# Patient Record
Sex: Female | Born: 1945 | Race: Black or African American | Hispanic: No | Marital: Single | Smoking: Former smoker
Health system: Southern US, Community
[De-identification: ages and names within clinical notes are randomized; demographics above are authoritative.]

## PROBLEM LIST (undated history)

## (undated) DIAGNOSIS — Z9889 Other specified postprocedural states: Secondary | ICD-10-CM

## (undated) DIAGNOSIS — R112 Nausea with vomiting, unspecified: Secondary | ICD-10-CM

## (undated) DIAGNOSIS — F329 Major depressive disorder, single episode, unspecified: Secondary | ICD-10-CM

## (undated) DIAGNOSIS — T4145XA Adverse effect of unspecified anesthetic, initial encounter: Secondary | ICD-10-CM

## (undated) DIAGNOSIS — R519 Headache, unspecified: Secondary | ICD-10-CM

## (undated) DIAGNOSIS — T8859XA Other complications of anesthesia, initial encounter: Secondary | ICD-10-CM

## (undated) DIAGNOSIS — J302 Other seasonal allergic rhinitis: Secondary | ICD-10-CM

## (undated) DIAGNOSIS — F32A Depression, unspecified: Secondary | ICD-10-CM

## (undated) DIAGNOSIS — I10 Essential (primary) hypertension: Secondary | ICD-10-CM

## (undated) DIAGNOSIS — R51 Headache: Secondary | ICD-10-CM

## (undated) DIAGNOSIS — S82891A Other fracture of right lower leg, initial encounter for closed fracture: Secondary | ICD-10-CM

## (undated) DIAGNOSIS — K589 Irritable bowel syndrome without diarrhea: Secondary | ICD-10-CM

## (undated) DIAGNOSIS — F419 Anxiety disorder, unspecified: Secondary | ICD-10-CM

## (undated) DIAGNOSIS — E785 Hyperlipidemia, unspecified: Secondary | ICD-10-CM

## (undated) HISTORY — PX: JOINT REPLACEMENT: SHX530

## (undated) HISTORY — PX: APPENDECTOMY: SHX54

---

## 1998-03-22 ENCOUNTER — Emergency Department (HOSPITAL_COMMUNITY): Admission: EM | Admit: 1998-03-22 | Discharge: 1998-03-22 | Payer: Self-pay | Admitting: Emergency Medicine

## 1998-03-22 ENCOUNTER — Encounter: Payer: Self-pay | Admitting: Emergency Medicine

## 2015-11-14 ENCOUNTER — Encounter (HOSPITAL_COMMUNITY): Payer: Self-pay | Admitting: Emergency Medicine

## 2015-11-14 ENCOUNTER — Emergency Department (HOSPITAL_COMMUNITY)
Admission: EM | Admit: 2015-11-14 | Discharge: 2015-11-14 | Disposition: A | Discharge status: Home / Self Care | Payer: Medicare HMO | Attending: Dermatology | Admitting: Dermatology

## 2015-11-14 DIAGNOSIS — R51 Headache: Secondary | ICD-10-CM | POA: Diagnosis not present

## 2015-11-14 DIAGNOSIS — I10 Essential (primary) hypertension: Secondary | ICD-10-CM | POA: Diagnosis not present

## 2015-11-14 DIAGNOSIS — Z5321 Procedure and treatment not carried out due to patient leaving prior to being seen by health care provider: Secondary | ICD-10-CM | POA: Diagnosis not present

## 2015-11-14 HISTORY — DX: Essential (primary) hypertension: I10

## 2015-11-14 NOTE — ED Notes (Signed)
Called for reassessment, no answer.

## 2015-11-14 NOTE — ED Notes (Signed)
Called to Reassess vitals. No answer. Times 3.

## 2015-11-14 NOTE — ED Triage Notes (Signed)
Pt sts sharp pain on right side of head starting this am

## 2015-11-14 NOTE — ED Notes (Signed)
Called to reassess, no answer.

## 2018-06-01 ENCOUNTER — Other Ambulatory Visit: Payer: Self-pay

## 2018-06-01 ENCOUNTER — Emergency Department (HOSPITAL_COMMUNITY)
Admission: EM | Admit: 2018-06-01 | Discharge: 2018-06-01 | Disposition: A | Discharge status: Home / Self Care | Payer: Medicare HMO | Attending: Emergency Medicine | Admitting: Emergency Medicine

## 2018-06-01 ENCOUNTER — Encounter (HOSPITAL_COMMUNITY): Payer: Self-pay | Admitting: Emergency Medicine

## 2018-06-01 ENCOUNTER — Emergency Department (HOSPITAL_COMMUNITY): Payer: Medicare HMO

## 2018-06-01 DIAGNOSIS — I10 Essential (primary) hypertension: Secondary | ICD-10-CM | POA: Insufficient documentation

## 2018-06-01 DIAGNOSIS — S99911A Unspecified injury of right ankle, initial encounter: Secondary | ICD-10-CM | POA: Diagnosis present

## 2018-06-01 DIAGNOSIS — Y998 Other external cause status: Secondary | ICD-10-CM | POA: Insufficient documentation

## 2018-06-01 DIAGNOSIS — S82841A Displaced bimalleolar fracture of right lower leg, initial encounter for closed fracture: Secondary | ICD-10-CM | POA: Insufficient documentation

## 2018-06-01 DIAGNOSIS — W1789XA Other fall from one level to another, initial encounter: Secondary | ICD-10-CM | POA: Insufficient documentation

## 2018-06-01 DIAGNOSIS — Y9301 Activity, walking, marching and hiking: Secondary | ICD-10-CM | POA: Insufficient documentation

## 2018-06-01 DIAGNOSIS — Z7982 Long term (current) use of aspirin: Secondary | ICD-10-CM | POA: Insufficient documentation

## 2018-06-01 DIAGNOSIS — Y92008 Other place in unspecified non-institutional (private) residence as the place of occurrence of the external cause: Secondary | ICD-10-CM | POA: Insufficient documentation

## 2018-06-01 MED ORDER — OXYCODONE HCL 5 MG PO TABS: 5.0000 mg | ORAL_TABLET | ORAL | 0 refills | Status: DC | PRN

## 2018-06-01 MED ORDER — ACETAMINOPHEN 500 MG PO TABS
1000.0000 mg | ORAL_TABLET | Freq: Once | ORAL | Status: AC
  Administered 2018-06-01: 1000 mg via ORAL
  Filled 2018-06-01: qty 2

## 2018-06-01 MED ORDER — OXYCODONE HCL 5 MG PO TABS
5.0000 mg | ORAL_TABLET | Freq: Once | ORAL | Status: AC
  Administered 2018-06-01: 5 mg via ORAL
  Filled 2018-06-01: qty 1

## 2018-06-01 NOTE — ED Provider Notes (Signed)
[facility]   CSN: [id number] Arrival date & time: 06/01/18  1410    History   Chief Complaint No chief complaint on file.   HPI Heather [name] is a 73 y.o. female.     HPI   73yo female presents with concern for mechanical fall with right ankle pain.  Reports both she and her daughter fell as they were walking out of her daughter's she-shed.  Reports she fell with her foot and ankle underneath her and she believes she broke it. Pain is severe. No numbness. Takes ASA no other anticoagulation.  No head trauma, headache, neck pain, chest pain, dyspnea or abdominal pain.    Past Medical History:  Diagnosis Date  . Hypertension     There are no active problems to display for this patient.   History reviewed. No pertinent surgical history.   OB History   No obstetric history on file.      Home Medications    Prior to Admission medications   Medication Sig Start Date End Date Taking? Authorizing Provider  oxyCODONE (ROXICODONE) 5 MG immediate release tablet Take 1 tablet (5 mg total) by mouth every 4 (four) hours as needed for severe pain. 06/01/18   Alvira MondaySchlossman, Kearsten Ginther, MD    Family History No family history on file.  Social History Social History   Tobacco Use  . Smoking status: Never Smoker  Substance Use Topics  . Alcohol use: Never    Frequency: Never  . Drug use: Never     Allergies   Augmentin [amoxicillin-pot clavulanate]   Review of Systems Review of Systems  Constitutional: Negative for fever.  HENT: Negative for sore throat.   Eyes: Negative for visual disturbance.  Respiratory: Negative for cough and shortness of breath.   Cardiovascular: Negative for chest pain.  Gastrointestinal: Negative for abdominal pain, nausea and vomiting.  Genitourinary: Negative for difficulty urinating.  Musculoskeletal: Positive for arthralgias and gait problem. Negative for back pain and neck pain.   Skin: Negative for rash.  Neurological: Negative for syncope and headaches.     Physical Exam Updated Vital Signs BP (!) 149/104   Pulse (!) 58   Temp 98.3 F (36.8 C) (Oral)   Resp 19   Ht 5' 3.5" (1.613 m)   Wt 74.8 kg   SpO2 96%   BMI 28.77 kg/m   Physical Exam Vitals signs and nursing note reviewed.  Constitutional:      General: She is not in acute distress.    Appearance: She is well-developed. She is not diaphoretic.  HENT:     Head: Normocephalic and atraumatic.  Eyes:     Conjunctiva/sclera: Conjunctivae normal.  Neck:     Musculoskeletal: Normal range of motion.  Cardiovascular:     Rate and Rhythm: Normal rate and regular rhythm.  Pulmonary:     Effort: Pulmonary effort is normal. No respiratory distress.  Musculoskeletal:     Right ankle: She exhibits decreased range of motion, swelling and abnormal pulse. She exhibits no deformity and no laceration. Tenderness. Lateral malleolus, medial malleolus and AITFL tenderness found. No posterior TFL, no head of 5th metatarsal and no proximal fibula tenderness found.  Skin:    General: Skin is warm and dry.     Findings: No erythema or rash.  Neurological:     Mental Status: She is alert and oriented to person, place, and time.      ED Treatments / Results  Labs (all  labs ordered are listed, but only abnormal results are displayed) Labs Reviewed - No data to display  EKG None  Radiology Dg Ankle Complete Right  Result Date: 06/01/2018 CLINICAL DATA:  Fall today, ankle pain. EXAM: RIGHT ANKLE - COMPLETE 3+ VIEW COMPARISON:  None. FINDINGS: Displaced fracture within the upper portion of the lateral malleolus, obliquely oriented, possibly comminuted. Additional minimally displaced fracture at the distal margin of the lateral malleolus. Displaced fracture of the medial malleolus, with approximately 6 mm inferior displacement of the distal fracture fragment. Associated asymmetry of the ankle mortise with lateral  widening. Associated soft tissue swelling overlying the medial and lateral malleolus. Talar dome appears intact. Visualized portions of the hindfoot and midfoot appear intact and normally aligned. IMPRESSION: Displaced fractures of the lateral malleolus and medial malleolus, as detailed above. Associated asymmetry of the ankle mortise with lateral widening. Associated soft tissue swelling. Electronically Signed   By: Bary Richard M.D.   On: 06/01/2018 15:04    Procedures .Splint Application Date/Time: 06/01/2018 8:20 PM Performed by: Alvira Monday, MD Authorized by: Alvira Monday, MD   Consent:    Consent obtained:  Verbal   Consent given by:  Patient   Risks discussed:  Discoloration   Alternatives discussed:  No treatment Pre-procedure details:    Sensation:  Normal Procedure details:    Laterality:  Right   Location:  Ankle   Ankle:  R ankle   Cast type:  Short leg   Splint type:  Ankle stirrup   Supplies:  Prefabricated splint Post-procedure details:    Pain:  Unchanged   Sensation:  Normal   Patient tolerance of procedure:  Tolerated well, no immediate complications   (including critical care time)  Medications Ordered in ED Medications  oxyCODONE (Oxy IR/ROXICODONE) immediate release tablet 5 mg (5 mg Oral Given 06/01/18 1432)  acetaminophen (TYLENOL) tablet 1,000 mg (1,000 mg Oral Given 06/01/18 1432)     Initial Impression / Assessment and Plan / ED Course  I have reviewed the triage vital signs and the nursing notes.  Pertinent labs & imaging results that were available during my care of the patient were reviewed by me and considered in my medical decision making (see chart for details).        73yo female presents with concern for mechanical fall with right ankle pain.  Denies other injuries by history and low suspicion for other injuries by mechanism.   XR shows bimalleolar fracture of right ankle. Closed fx, NV intact. Discussed with Dr. Eulah Pont of  Orthopedics.  Recommends splint, NWB, follow up tomorrow AM at 830AM.     Final Clinical Impressions(s) / ED Diagnoses   Final diagnoses:  Closed bimalleolar fracture of right ankle, initial encounter    ED Discharge Orders         Ordered    oxyCODONE (ROXICODONE) 5 MG immediate release tablet  Every 4 hours PRN     06/01/18 1622           Alvira Monday, MD 06/01/18 2023

## 2018-06-01 NOTE — ED Triage Notes (Signed)
[facility]- pt fell at home outside. Injury to the right ankle. No LOC. Pt claims she did not hit her head    150/90 70HR  98% RA

## 2018-06-01 NOTE — ED Notes (Signed)
ED Provider at bedside. 

## 2018-06-01 NOTE — ED Notes (Signed)
Ortho tech at bedside 

## 2018-06-01 NOTE — Discharge Instructions (Signed)
Do not bear weight Take Tylenol 1000 mg 4 times a day for 1 week. This is the maximum dose of Tylenol (acetaminophen) you can take from all sources. Please check other over-the-counter medications and prescriptions to ensure you are not taking other medications that contain acetaminophen.  You may also take ibuprofen 400 mg 6 times a day alternating with or at the same time as tylenol.  Take oxycodone as needed for breakthrough pain.  This medication can be addicting, sedating and cause constipation.

## 2018-06-01 NOTE — Progress Notes (Signed)
Orthopedic Tech Progress Note Patient Details:  Heather [name] [birth year] [id number] Applied short leg splint with stirr ups with help by the DR. I asked if she wanted fiberglass or plaster and she said it didn't matter. So I went with fiberglass.  Ortho Devices Type of Ortho Device: Crutches, Stirrup splint, Short leg splint Ortho Device/Splint Location: LRE Ortho Device/Splint Interventions: Adjustment, Application, Ordered   Post Interventions Patient Tolerated: Well Instructions Provided: Care of device, Adjustment of device   [name] 06/01/2018, 4:08 PM

## 2018-06-01 NOTE — ED Notes (Signed)
Notified Ortho tech of new order; will come to pt bedside soon.

## 2018-06-01 NOTE — ED Notes (Signed)
Patient verbalizes understanding of discharge instructions. Opportunity for questioning and answers were provided. Armband removed by staff, pt discharged from ED.  

## 2018-06-02 ENCOUNTER — Encounter (HOSPITAL_BASED_OUTPATIENT_CLINIC_OR_DEPARTMENT_OTHER): Payer: Self-pay | Admitting: *Deleted

## 2018-06-02 ENCOUNTER — Other Ambulatory Visit: Payer: Self-pay

## 2018-06-02 ENCOUNTER — Encounter (HOSPITAL_COMMUNITY): Payer: Self-pay | Admitting: Emergency Medicine

## 2018-06-03 NOTE — H&P (Signed)
[facility]  [address]   [address] [place] [address] [phone]  A Division of [facility]  RE: Takeko, Mcnicol   [phone]        DOB: 10-Nov-1945 INITIAL EVALUATION 06/02/2018  Reason for visit:  First office visit, ED referral for right ankle fracture yesterday 06/01/2018.    HPI:  This is an acute problem that began yesterday.  She was coming out of her daughter's She Shed, slipped and fell.  She presented to the emergency department where x-rays showed bimalleolar ankle fracture.  She was placed in a splint and referred for followup.  Today pain is controlled.  She has a history of bilateral knee arthroplasties and hypertension.  No history of MI, CVA, DVT or PE.  No diabetes.  She is a nonsmoker.  She ambulates independently without aid.  Allergy to Augmentin - rash and diarrhea.  She takes Celebrex chronically, and over-the-counter medicine for gastric protection, as well as amlodipine for hypertension.     EXAMINATION: Well appearing female in no apparent distress.  Splint is intact and in good condition on arrival.  She wiggles toes.  Sensation is intact.    IMAGES: Post-reduction x-rays performed in the office today show acceptable reduction and alignment of her bimalleolar ankle fracture.    ASSESSMENT & PLAN: Acute traumatic right ankle fracture.  Recommend open reduction and internal fixation bimalleolar fixation.  Details, risks and benefits are explained, along with the preoperative and postoperative course.  She verbalized understanding and wishes to proceed.  She will plan to follow up postoperatively.     Jewel Baize.  Eulah Pont, M.D.  Dictated by Avis Epley III, PA-C Electronically verified by Jewel Baize Eulah Pont, M.D. TDM(HCM):pmw D 06/02/18 T 06/03/18

## 2018-06-05 ENCOUNTER — Ambulatory Visit (HOSPITAL_BASED_OUTPATIENT_CLINIC_OR_DEPARTMENT_OTHER): Payer: Medicare HMO | Admitting: Anesthesiology

## 2018-06-05 ENCOUNTER — Other Ambulatory Visit: Payer: Self-pay

## 2018-06-05 ENCOUNTER — Ambulatory Visit (HOSPITAL_BASED_OUTPATIENT_CLINIC_OR_DEPARTMENT_OTHER)
Admission: RE | Admit: 2018-06-05 | Discharge: 2018-06-05 | Disposition: A | Discharge status: Home / Self Care | Payer: Medicare HMO | Attending: Orthopedic Surgery | Admitting: Orthopedic Surgery

## 2018-06-05 ENCOUNTER — Encounter (HOSPITAL_BASED_OUTPATIENT_CLINIC_OR_DEPARTMENT_OTHER)
Admission: RE | Disposition: A | Discharge status: Home / Self Care | Payer: Self-pay | Source: Home / Self Care | Attending: Orthopedic Surgery

## 2018-06-05 ENCOUNTER — Encounter (HOSPITAL_BASED_OUTPATIENT_CLINIC_OR_DEPARTMENT_OTHER): Payer: Self-pay | Admitting: Emergency Medicine

## 2018-06-05 DIAGNOSIS — Z96653 Presence of artificial knee joint, bilateral: Secondary | ICD-10-CM | POA: Insufficient documentation

## 2018-06-05 DIAGNOSIS — I10 Essential (primary) hypertension: Secondary | ICD-10-CM | POA: Insufficient documentation

## 2018-06-05 DIAGNOSIS — S82841A Displaced bimalleolar fracture of right lower leg, initial encounter for closed fracture: Secondary | ICD-10-CM | POA: Insufficient documentation

## 2018-06-05 DIAGNOSIS — Y9389 Activity, other specified: Secondary | ICD-10-CM | POA: Insufficient documentation

## 2018-06-05 DIAGNOSIS — S82891A Other fracture of right lower leg, initial encounter for closed fracture: Secondary | ICD-10-CM | POA: Diagnosis present

## 2018-06-05 DIAGNOSIS — W010XXA Fall on same level from slipping, tripping and stumbling without subsequent striking against object, initial encounter: Secondary | ICD-10-CM | POA: Insufficient documentation

## 2018-06-05 DIAGNOSIS — Z880 Allergy status to penicillin: Secondary | ICD-10-CM | POA: Diagnosis not present

## 2018-06-05 HISTORY — DX: Irritable bowel syndrome, unspecified: K58.9

## 2018-06-05 HISTORY — DX: Hyperlipidemia, unspecified: E78.5

## 2018-06-05 HISTORY — DX: Other specified postprocedural states: R11.2

## 2018-06-05 HISTORY — DX: Depression, unspecified: F32.A

## 2018-06-05 HISTORY — DX: Other seasonal allergic rhinitis: J30.2

## 2018-06-05 HISTORY — PX: ORIF ANKLE FRACTURE: SHX5408

## 2018-06-05 HISTORY — DX: Major depressive disorder, single episode, unspecified: F32.9

## 2018-06-05 HISTORY — DX: Other complications of anesthesia, initial encounter: T88.59XA

## 2018-06-05 HISTORY — DX: Headache: R51

## 2018-06-05 HISTORY — DX: Adverse effect of unspecified anesthetic, initial encounter: T41.45XA

## 2018-06-05 HISTORY — DX: Anxiety disorder, unspecified: F41.9

## 2018-06-05 HISTORY — DX: Other fracture of right lower leg, initial encounter for closed fracture: S82.891A

## 2018-06-05 HISTORY — DX: Other specified postprocedural states: Z98.890

## 2018-06-05 HISTORY — DX: Headache, unspecified: R51.9

## 2018-06-05 SURGERY — OPEN REDUCTION INTERNAL FIXATION (ORIF) ANKLE FRACTURE
Anesthesia: General | Laterality: Right

## 2018-06-05 MED ORDER — ONDANSETRON HCL 4 MG/2ML IJ SOLN
INTRAMUSCULAR | Status: DC | PRN
  Administered 2018-06-05: 4 mg via INTRAVENOUS

## 2018-06-05 MED ORDER — ACETAMINOPHEN 500 MG PO TABS
ORAL_TABLET | ORAL | Status: AC
  Filled 2018-06-05: qty 2

## 2018-06-05 MED ORDER — LACTATED RINGERS IV SOLN: INTRAVENOUS | Status: DC

## 2018-06-05 MED ORDER — HYDROMORPHONE HCL 1 MG/ML IJ SOLN: 0.2500 mg | INTRAMUSCULAR | Status: DC | PRN

## 2018-06-05 MED ORDER — ACETAMINOPHEN 500 MG PO TABS
1000.0000 mg | ORAL_TABLET | Freq: Once | ORAL | Status: AC
  Administered 2018-06-05: 07:00:00 1000 mg via ORAL

## 2018-06-05 MED ORDER — BACLOFEN 10 MG PO TABS: 10.0000 mg | ORAL_TABLET | Freq: Two times a day (BID) | ORAL | 0 refills | Status: DC | PRN

## 2018-06-05 MED ORDER — CEFAZOLIN SODIUM-DEXTROSE 2-4 GM/100ML-% IV SOLN
INTRAVENOUS | Status: AC
  Filled 2018-06-05: qty 100

## 2018-06-05 MED ORDER — CEFAZOLIN SODIUM-DEXTROSE 2-4 GM/100ML-% IV SOLN: 2.0000 g | INTRAVENOUS | Status: DC

## 2018-06-05 MED ORDER — LIDOCAINE HCL (CARDIAC) PF 100 MG/5ML IV SOSY
PREFILLED_SYRINGE | INTRAVENOUS | Status: DC | PRN
  Administered 2018-06-05: 100 mg via INTRAVENOUS

## 2018-06-05 MED ORDER — OXYCODONE HCL 5 MG PO TABS: 5.0000 mg | ORAL_TABLET | ORAL | 0 refills | Status: AC | PRN

## 2018-06-05 MED ORDER — FENTANYL CITRATE (PF) 100 MCG/2ML IJ SOLN
50.0000 ug | INTRAMUSCULAR | Status: DC | PRN
  Administered 2018-06-05 (×2): 50 ug via INTRAVENOUS

## 2018-06-05 MED ORDER — BUPIVACAINE HCL (PF) 0.5 % IJ SOLN
INTRAMUSCULAR | Status: DC | PRN
  Administered 2018-06-05: 25 mL via PERINEURAL

## 2018-06-05 MED ORDER — LACTATED RINGERS IV SOLN
INTRAVENOUS | Status: DC
  Administered 2018-06-05: 07:00:00 via INTRAVENOUS

## 2018-06-05 MED ORDER — LIDOCAINE 2% (20 MG/ML) 5 ML SYRINGE
INTRAMUSCULAR | Status: AC
  Filled 2018-06-05: qty 5

## 2018-06-05 MED ORDER — CHLORHEXIDINE GLUCONATE 4 % EX LIQD: 60.0000 mL | Freq: Once | CUTANEOUS | Status: DC

## 2018-06-05 MED ORDER — FENTANYL CITRATE (PF) 100 MCG/2ML IJ SOLN
INTRAMUSCULAR | Status: AC
  Filled 2018-06-05: qty 2

## 2018-06-05 MED ORDER — ASPIRIN EC 81 MG PO TBEC: 81.0000 mg | DELAYED_RELEASE_TABLET | Freq: Two times a day (BID) | ORAL | 0 refills | Status: DC

## 2018-06-05 MED ORDER — PROPOFOL 10 MG/ML IV BOLUS
INTRAVENOUS | Status: AC
  Filled 2018-06-05: qty 40

## 2018-06-05 MED ORDER — MIDAZOLAM HCL 2 MG/2ML IJ SOLN
1.0000 mg | INTRAMUSCULAR | Status: DC | PRN
  Administered 2018-06-05 (×2): 1 mg via INTRAVENOUS

## 2018-06-05 MED ORDER — GABAPENTIN 300 MG PO CAPS
300.0000 mg | ORAL_CAPSULE | Freq: Once | ORAL | Status: AC
  Administered 2018-06-05: 300 mg via ORAL

## 2018-06-05 MED ORDER — BUPIVACAINE HCL (PF) 0.5 % IJ SOLN
INTRAMUSCULAR | Status: DC | PRN
  Administered 2018-06-05: 10 mL

## 2018-06-05 MED ORDER — PROPOFOL 10 MG/ML IV BOLUS
INTRAVENOUS | Status: DC | PRN
  Administered 2018-06-05: 150 mg via INTRAVENOUS

## 2018-06-05 MED ORDER — DEXAMETHASONE SODIUM PHOSPHATE 10 MG/ML IJ SOLN
INTRAMUSCULAR | Status: AC
  Filled 2018-06-05: qty 1

## 2018-06-05 MED ORDER — CEFAZOLIN SODIUM-DEXTROSE 2-3 GM-%(50ML) IV SOLR
INTRAVENOUS | Status: DC | PRN
  Administered 2018-06-05: 2 g via INTRAVENOUS

## 2018-06-05 MED ORDER — ONDANSETRON HCL 4 MG/2ML IJ SOLN
INTRAMUSCULAR | Status: AC
  Filled 2018-06-05: qty 2

## 2018-06-05 MED ORDER — BUPIVACAINE HCL (PF) 0.5 % IJ SOLN
INTRAMUSCULAR | Status: AC
  Filled 2018-06-05: qty 30

## 2018-06-05 MED ORDER — DEXAMETHASONE SODIUM PHOSPHATE 10 MG/ML IJ SOLN
INTRAMUSCULAR | Status: DC | PRN
  Administered 2018-06-05: 10 mg via INTRAVENOUS

## 2018-06-05 MED ORDER — ACETAMINOPHEN 500 MG PO TABS: 1000.0000 mg | ORAL_TABLET | Freq: Three times a day (TID) | ORAL | 0 refills | Status: AC

## 2018-06-05 MED ORDER — MIDAZOLAM HCL 2 MG/2ML IJ SOLN
INTRAMUSCULAR | Status: AC
  Filled 2018-06-05: qty 2

## 2018-06-05 MED ORDER — OXYCODONE HCL 5 MG/5ML PO SOLN: 5.0000 mg | Freq: Once | ORAL | Status: DC | PRN

## 2018-06-05 MED ORDER — OXYCODONE HCL 5 MG PO TABS: 5.0000 mg | ORAL_TABLET | Freq: Once | ORAL | Status: DC | PRN

## 2018-06-05 MED ORDER — LIDOCAINE-EPINEPHRINE (PF) 1.5 %-1:200000 IJ SOLN
INTRAMUSCULAR | Status: DC | PRN
  Administered 2018-06-05: 10 mL via PERINEURAL

## 2018-06-05 MED ORDER — ONDANSETRON HCL 4 MG PO TABS: 4.0000 mg | ORAL_TABLET | Freq: Two times a day (BID) | ORAL | 0 refills | Status: DC | PRN

## 2018-06-05 MED ORDER — SCOPOLAMINE 1 MG/3DAYS TD PT72: 1.0000 | MEDICATED_PATCH | Freq: Once | TRANSDERMAL | Status: DC | PRN

## 2018-06-05 MED ORDER — GABAPENTIN 300 MG PO CAPS
ORAL_CAPSULE | ORAL | Status: AC
  Filled 2018-06-05: qty 1

## 2018-06-05 MED ORDER — GLYCOPYRROLATE 0.2 MG/ML IJ SOLN
INTRAMUSCULAR | Status: DC | PRN
  Administered 2018-06-05: 0.2 mg via INTRAVENOUS

## 2018-06-05 MED ORDER — DOCUSATE SODIUM 100 MG PO CAPS: 100.0000 mg | ORAL_CAPSULE | Freq: Two times a day (BID) | ORAL | 0 refills | Status: DC

## 2018-06-05 MED ORDER — PROMETHAZINE HCL 25 MG/ML IJ SOLN: 6.2500 mg | INTRAMUSCULAR | Status: DC | PRN

## 2018-06-05 SURGICAL SUPPLY — 79 items
APL PRP STRL LF DISP 70% ISPRP (MISCELLANEOUS) ×2
BANDAGE ACE 4X5 VEL STRL LF (GAUZE/BANDAGES/DRESSINGS) ×2 IMPLANT
BANDAGE ACE 6X5 VEL STRL LF (GAUZE/BANDAGES/DRESSINGS) ×2 IMPLANT
BANDAGE ESMARK 6X9 LF (GAUZE/BANDAGES/DRESSINGS) ×1 IMPLANT
BIT DRILL 2.5X125 (BIT) ×1 IMPLANT
BIT DRILL 3.5X125 (BIT) IMPLANT
BIT DRILL CANN 2.7 (BIT) ×2
BIT DRILL SRG 2.7XCANN AO CPLG (BIT) IMPLANT
BIT DRL SRG 2.7XCANN AO CPLNG (BIT) ×1
BLADE SURG 15 STRL LF DISP TIS (BLADE) ×2 IMPLANT
BLADE SURG 15 STRL SS (BLADE) ×4
BNDG CMPR 9X6 STRL LF SNTH (GAUZE/BANDAGES/DRESSINGS) ×1
BNDG COHESIVE 4X5 TAN STRL (GAUZE/BANDAGES/DRESSINGS) ×2 IMPLANT
BNDG ESMARK 6X9 LF (GAUZE/BANDAGES/DRESSINGS) ×2
CHLORAPREP W/TINT 26 (MISCELLANEOUS) ×3 IMPLANT
CLSR STERI-STRIP ANTIMIC 1/2X4 (GAUZE/BANDAGES/DRESSINGS) ×2 IMPLANT
COVER BACK TABLE REUSABLE LG (DRAPES) ×2 IMPLANT
COVER WAND RF STERILE (DRAPES) IMPLANT
CUFF TOURN SGL QUICK 24 (TOURNIQUET CUFF)
CUFF TOURN SGL QUICK 34 (TOURNIQUET CUFF) ×2
CUFF TRNQT CYL 24X4X16.5-23 (TOURNIQUET CUFF) IMPLANT
CUFF TRNQT CYL 34X4.125X (TOURNIQUET CUFF) IMPLANT
DECANTER SPIKE VIAL GLASS SM (MISCELLANEOUS) IMPLANT
DRAPE EXTREMITY T 121X128X90 (DISPOSABLE) ×2 IMPLANT
DRAPE IMP U-DRAPE 54X76 (DRAPES) ×2 IMPLANT
DRAPE OEC MINIVIEW 54X84 (DRAPES) ×2 IMPLANT
DRAPE U-SHAPE 47X51 STRL (DRAPES) ×2 IMPLANT
DRILL 2.6X122MM WL AO SHAFT (BIT) ×1 IMPLANT
DRILL BIT 3.5X125 (BIT) ×2
DRSG EMULSION OIL 3X3 NADH (GAUZE/BANDAGES/DRESSINGS) ×2 IMPLANT
DRSG PAD ABDOMINAL 8X10 ST (GAUZE/BANDAGES/DRESSINGS) ×4 IMPLANT
ELECT REM PT RETURN 9FT ADLT (ELECTROSURGICAL) ×2
ELECTRODE REM PT RTRN 9FT ADLT (ELECTROSURGICAL) ×1 IMPLANT
GAUZE SPONGE 4X4 12PLY STRL (GAUZE/BANDAGES/DRESSINGS) ×2 IMPLANT
GAUZE XEROFORM 5X9 LF (GAUZE/BANDAGES/DRESSINGS) ×1 IMPLANT
GLOVE BIO SURGEON STRL SZ7.5 (GLOVE) ×5 IMPLANT
GLOVE BIOGEL PI IND STRL 8 (GLOVE) ×2 IMPLANT
GLOVE BIOGEL PI INDICATOR 8 (GLOVE) ×2
GOWN STRL REUS W/ TWL LRG LVL3 (GOWN DISPOSABLE) ×2 IMPLANT
GOWN STRL REUS W/ TWL XL LVL3 (GOWN DISPOSABLE) ×1 IMPLANT
GOWN STRL REUS W/TWL LRG LVL3 (GOWN DISPOSABLE) ×4
GOWN STRL REUS W/TWL XL LVL3 (GOWN DISPOSABLE) ×2
K-WIRE ORTHOPEDIC 1.4X150L (WIRE) ×4
KWIRE ORTHOPEDIC 1.4X150L (WIRE) IMPLANT
NEEDLE HYPO 22GX1.5 SAFETY (NEEDLE) ×1 IMPLANT
NS IRRIG 1000ML POUR BTL (IV SOLUTION) ×2 IMPLANT
PACK BASIN DAY SURGERY FS (CUSTOM PROCEDURE TRAY) ×2 IMPLANT
PAD CAST 4YDX4 CTTN HI CHSV (CAST SUPPLIES) ×1 IMPLANT
PADDING CAST ABS 4INX4YD NS (CAST SUPPLIES) ×2
PADDING CAST ABS COTTON 4X4 ST (CAST SUPPLIES) ×2 IMPLANT
PADDING CAST COTTON 4X4 STRL (CAST SUPPLIES) ×2
PADDING CAST COTTON 6X4 STRL (CAST SUPPLIES) ×3 IMPLANT
PENCIL BUTTON HOLSTER BLD 10FT (ELECTRODE) ×2 IMPLANT
PLATE FIBULA 4H (Plate) ×1 IMPLANT
SCREW 3.5X10MM (Screw) ×1 IMPLANT
SCREW BONE 14MMX3.5MM (Screw) ×3 IMPLANT
SCREW BONE 3.5X20MM (Screw) ×1 IMPLANT
SCREW BONE NON-LCKING 3.5X12MM (Screw) ×2 IMPLANT
SCREW CANNULATED 4.0 (Screw) ×2 IMPLANT
SCREW LOCKING 3.5X12 (Screw) ×2 IMPLANT
SLEEVE SCD COMPRESS KNEE MED (MISCELLANEOUS) ×1 IMPLANT
SPLINT FAST PLASTER 5X30 (CAST SUPPLIES) ×20
SPLINT PLASTER CAST FAST 5X30 (CAST SUPPLIES) ×20 IMPLANT
SPONGE LAP 4X18 RFD (DISPOSABLE) ×2 IMPLANT
SUCTION FRAZIER HANDLE 10FR (MISCELLANEOUS) ×1
SUCTION TUBE FRAZIER 10FR DISP (MISCELLANEOUS) ×1 IMPLANT
SUT ETHILON 3 0 PS 1 (SUTURE) ×3 IMPLANT
SUT MNCRL AB 4-0 PS2 18 (SUTURE) IMPLANT
SUT MON AB 2-0 CT1 36 (SUTURE) IMPLANT
SUT MON AB 3-0 SH 27 (SUTURE)
SUT MON AB 3-0 SH27 (SUTURE) IMPLANT
SUT VIC AB 0 SH 27 (SUTURE) ×2 IMPLANT
SUT VIC AB 2-0 SH 27 (SUTURE) ×4
SUT VIC AB 2-0 SH 27XBRD (SUTURE) IMPLANT
SYR BULB 3OZ (MISCELLANEOUS) ×2 IMPLANT
SYR CONTROL 10ML LL (SYRINGE) ×1 IMPLANT
TOWEL GREEN STERILE FF (TOWEL DISPOSABLE) ×4 IMPLANT
TUBE CONNECTING 20X1/4 (TUBING) ×2 IMPLANT
UNDERPAD 30X30 (UNDERPADS AND DIAPERS) ×2 IMPLANT

## 2018-06-05 NOTE — Progress Notes (Signed)
Assisted Dr. Rose with right, ultrasound guided, popliteal block. Side rails up, monitors on throughout procedure. See vital signs in flow sheet. Tolerated Procedure well. °

## 2018-06-05 NOTE — Discharge Instructions (Signed)
It is extremely important for you to Elevate your leg - Toes above your nose as much as possible to reduce pain / swelling and to help your surgical site heal properly.  Weight Bearing:  Non weight bearing affected leg.  Diet: As you were doing prior to hospitalization   Dressing:  You have a splint. Leave the splint dry and in place and we will change your bandages during your first follow-up appointment.  You may loosen and re-apply ace wrap if it feels too tight.  Activity:  Increase activity slowly as tolerated, but follow the weight bearing instructions below.  The rules on driving is that you can not be taking narcotics while you drive, and you must feel in control of the vehicle.    To prevent constipation:  Narcotic medicines cause constipation.  Wean these as soon as is appropriate.   You may use a stool softener such as -  Colace (over the counter) 100 mg by mouth twice a day  Drink plenty of fluids (prune juice may be helpful) and high fiber foods Miralax (over the counter) for constipation as needed.    Itching:  If you experience itching with your medications, try taking only a single pain pill, or even half a pain pill at a time.  You can also use benadryl over the counter for itching or also to help with sleep.   Precautions:  If you experience chest pain or shortness of breath - call 911 immediately for transfer to the hospital emergency department!!  If you develop a fever greater that 101 F, purulent drainage from wound, increased redness or drainage from wound, or calf pain -- Call the office at [phone]                                                 Follow- Up Appointment:  Please call for an appointment to be seen in 1-2 weeks [place] - [phone]  Post Anesthesia Home Care Instructions  Activity: Get plenty of rest for the remainder of the day. A responsible individual must stay with you for 24 hours following the procedure.  For the next 24 hours, DO  NOT: -Drive a car -Advertising copywriter -Drink alcoholic beverages -Take any medication unless instructed by your physician -Make any legal decisions or sign important papers.  Meals: Start with liquid foods such as gelatin or soup. Progress to regular foods as tolerated. Avoid greasy, spicy, heavy foods. If nausea and/or vomiting occur, drink only clear liquids until the nausea and/or vomiting subsides. Call your physician if vomiting continues.  Special Instructions/Symptoms: Your throat may feel dry or sore from the anesthesia or the breathing tube placed in your throat during surgery. If this causes discomfort, gargle with warm salt water. The discomfort should disappear within 24 hours.  If you had a scopolamine patch placed behind your ear for the management of post- operative nausea and/or vomiting:  1. The medication in the patch is effective for 72 hours, after which it should be removed.  Wrap patch in a tissue and discard in the trash. Wash hands thoroughly with soap and water. 2. You may remove the patch earlier than 72 hours if you experience unpleasant side effects which may include dry mouth, dizziness or visual disturbances. 3. Avoid touching the patch. Wash your hands with soap and water after contact with  the patch.    Regional Anesthesia Blocks  1. Numbness or the inability to move the "blocked" extremity may last from 3-48 hours after placement. The length of time depends on the medication injected and your individual response to the medication. If the numbness is not going away after 48 hours, call your surgeon.  2. The extremity that is blocked will need to be protected until the numbness is gone and the  Strength has returned. Because you cannot feel it, you will need to take extra care to avoid injury. Because it may be weak, you may have difficulty moving it or using it. You may not know what position it is in without looking at it while the block is in effect.  3. For  blocks in the legs and feet, returning to weight bearing and walking needs to be done carefully. You will need to wait until the numbness is entirely gone and the strength has returned. You should be able to move your leg and foot normally before you try and bear weight or walk. You will need someone to be with you when you first try to ensure you do not fall and possibly risk injury.  4. Bruising and tenderness at the needle site are common side effects and will resolve in a few days.  5. Persistent numbness or new problems with movement should be communicated to the surgeon or the [facility] [phone] [facility] [phone]).

## 2018-06-05 NOTE — Transfer of Care (Signed)
Immediate Anesthesia Transfer of Care Note  Patient: Heather [name]  Procedure(s) Performed: OPEN REDUCTION INTERNAL FIXATION (ORIF) ANKLE FRACTURE (Right )  Patient Location: PACU  Anesthesia Type:General and Regional  Level of Consciousness: awake, alert  and oriented  Airway & Oxygen Therapy: Patient Spontanous Breathing and Patient connected to face mask oxygen  Post-op Assessment: Report given to RN and Post -op Vital signs reviewed and stable  Post vital signs: Reviewed and stable  Last Vitals:  Vitals Value Taken Time  BP    Temp    Pulse    Resp 23 06/05/2018  9:17 AM  SpO2    Vitals shown include unvalidated device data.  Last Pain:  Vitals:   06/05/18 0655  TempSrc: Oral  PainSc: 5       Patients Stated Pain Goal: 5 (06/05/18 7680)  Complications: No apparent anesthesia complications

## 2018-06-05 NOTE — Anesthesia Procedure Notes (Signed)
Anesthesia Regional Block: Popliteal block   Pre-Anesthetic Checklist: ,, timeout performed, Correct Patient, Correct Site, Correct Laterality, Correct Procedure, Correct Position, site marked, Risks and benefits discussed,  Surgical consent,  Pre-op evaluation,  At surgeon's request and post-op pain management  Laterality: Right  Prep: chloraprep       Needles:  Injection technique: Single-shot  Needle Type: Echogenic Needle     Needle Length: 9cm      Additional Needles:   Procedures:,,,, ultrasound used (permanent image in chart),,,,  Narrative:  Start time: 06/05/2018 7:14 AM End time: 06/05/2018 7:20 AM Injection made incrementally with aspirations every 5 mL.  Performed by: Personally  Anesthesiologist: Eilene Ghazi, MD  Additional Notes: Patient tolerated the procedure well without complications

## 2018-06-05 NOTE — Anesthesia Procedure Notes (Signed)
Anesthesia Procedure Image    

## 2018-06-05 NOTE — Op Note (Signed)
06/05/2018  8:31 AM  PATIENT:  Heather [name]    PRE-OPERATIVE DIAGNOSIS:  RIGHT ANKLE FRACTURE  POST-OPERATIVE DIAGNOSIS:  Same  PROCEDURE:  OPEN REDUCTION INTERNAL FIXATION (ORIF) ANKLE FRACTURE  SURGEON:  Sheral Apley, MD  ASSISTANT: Aquilla Hacker, PA-C, he was present and scrubbed throughout the case, critical for completion in a timely fashion, and for retraction, instrumentation, and closure.   ANESTHESIA:   gen   PREOPERATIVE INDICATIONS:  Heather [name] is a  73 y.o. female with a diagnosis of RIGHT ANKLE FRACTURE who failed conservative measures and elected for surgical management.    The risks benefits and alternatives were discussed with the patient preoperatively including but not limited to the risks of infection, bleeding, nerve injury, cardiopulmonary complications, the need for revision surgery, among others, and the patient was willing to proceed.  OPERATIVE IMPLANTS: stryker locking plate  OPERATIVE FINDINGS: Unstable ankle fracture. Stable syndesmosis post op  BLOOD LOSS: min  COMPLICATIONS: none  TOURNIQUET TIME:  OPERATIVE PROCEDURE:  Patient was identified in the preoperative holding area and site was marked by me He was transported to the operating theater and placed on the table in supine position taking care to pad all bony prominences. After a preincinduction time out anesthesia was induced. The right lower extremity was prepped and draped in normal sterile fashion and a pre-incision timeout was performed. Heather [name] received ancef for preoperative antibiotics.   I made a lateral incision of roughly 7 cm dissection was carried down sharply to the distal fibula and then spreading dissection was used proximally to protect the superficial peroneal nerve. I sharply incised the periosteum and took care to protect the peroneal tendons. I then debrided the fracture site and performed a reduction maneuver which was held in  place with a clamp.   I placed a lag screw across the fracture  I then selected a lateral locking plate and placed in a neutralization fashion care was taken distally so as not to penetrate the joint with the cancellus screws.  I then turned my attention medially where I created a 4 cm incision and dissected sharply down to the medial Mal fracture taking care to protect the saphenous vein. I debrided the fracture and reduced and held in place with a tenaculum. I then drilled and placed 2 partially threaded 40 mm cannulated screws one anterior and one posterior across the fracture.  I then stressed the syndesmosis and it was stable  The wound was then thoroughly irrigated and closed using a 0 Vicryl and absorbable Monocryl sutures. He was placed in a short leg splint.   POST OPERATIVE PLAN: Non-weightbearing. DVT prophylaxis will consist of mobilization and chemical px

## 2018-06-05 NOTE — Anesthesia Postprocedure Evaluation (Signed)
Anesthesia Post Note  Patient: Macaylee Bireley  Procedure(s) Performed: OPEN REDUCTION INTERNAL FIXATION (ORIF) ANKLE FRACTURE (Right )     Patient location during evaluation: PACU Anesthesia Type: General Level of consciousness: awake and alert Pain management: pain level controlled Vital Signs Assessment: post-procedure vital signs reviewed and stable Respiratory status: spontaneous breathing, nonlabored ventilation, respiratory function stable and patient connected to nasal cannula oxygen Cardiovascular status: blood pressure returned to baseline and stable Postop Assessment: no apparent nausea or vomiting Anesthetic complications: no    Last Vitals:  Vitals:   06/05/18 0945 06/05/18 1030  BP: 140/78 (!) 152/78  Pulse: 83 81  Resp: 17 16  Temp:  37.2 C  SpO2: 97% 95%    Last Pain:  Vitals:   06/05/18 1030  TempSrc:   PainSc: 0-No pain        RLE Motor Response: No movement due to regional block (06/05/18 1030) RLE Sensation: No sensation (absent) (06/05/18 1030)      Carroll Lingelbach S

## 2018-06-05 NOTE — Interval H&P Note (Signed)
I participated in the care of this patient and agree with the above history, physical and evaluation. I performed a review of the history and a physical exam as detailed   Barlow Harrison Daniel Shed Nixon MD  

## 2018-06-05 NOTE — Anesthesia Preprocedure Evaluation (Signed)
Anesthesia Evaluation  Patient identified by MRN, date of birth, ID band Patient awake    Reviewed: Allergy & Precautions, NPO status , Patient's Chart, lab work & pertinent test results  History of Anesthesia Complications (+) PONV  Airway Mallampati: II  TM Distance: >3 FB Neck ROM: Full    Dental no notable dental hx.    Pulmonary neg pulmonary ROS,    Pulmonary exam normal breath sounds clear to auscultation       Cardiovascular hypertension, Normal cardiovascular exam Rhythm:Regular Rate:Normal     Neuro/Psych negative neurological ROS  negative psych ROS   GI/Hepatic negative GI ROS, Neg liver ROS,   Endo/Other  negative endocrine ROS  Renal/GU negative Renal ROS  negative genitourinary   Musculoskeletal negative musculoskeletal ROS (+)   Abdominal   Peds negative pediatric ROS (+)  Hematology negative hematology ROS (+)   Anesthesia Other Findings   Reproductive/Obstetrics negative OB ROS                             Anesthesia Physical Anesthesia Plan  ASA: II  Anesthesia Plan: General   Post-op Pain Management:  Regional for Post-op pain   Induction: Intravenous and Rapid sequence  PONV Risk Score and Plan: 4 or greater and Ondansetron, Dexamethasone and Treatment may vary due to age or medical condition  Airway Management Planned: Oral ETT  Additional Equipment:   Intra-op Plan:   Post-operative Plan: Extubation in OR  Informed Consent: I have reviewed the patients History and Physical, chart, labs and discussed the procedure including the risks, benefits and alternatives for the proposed anesthesia with the patient or authorized representative who has indicated his/her understanding and acceptance.     Dental advisory given  Plan Discussed with: CRNA and Surgeon  Anesthesia Plan Comments:         Anesthesia Quick Evaluation

## 2018-06-06 ENCOUNTER — Encounter (HOSPITAL_BASED_OUTPATIENT_CLINIC_OR_DEPARTMENT_OTHER): Payer: Self-pay | Admitting: Orthopedic Surgery

## 2018-12-24 ENCOUNTER — Other Ambulatory Visit: Payer: Self-pay

## 2018-12-24 DIAGNOSIS — Z20822 Contact with and (suspected) exposure to covid-19: Secondary | ICD-10-CM

## 2018-12-26 ENCOUNTER — Telehealth: Payer: Self-pay | Admitting: General Practice

## 2018-12-26 LAB — NOVEL CORONAVIRUS, NAA: SARS-CoV-2, NAA: NOT DETECTED

## 2018-12-26 NOTE — Telephone Encounter (Signed)
Pt called in for covid result.  °Advised of Not Detected result.  °

## 2019-04-20 ENCOUNTER — Other Ambulatory Visit: Payer: Self-pay

## 2019-04-20 ENCOUNTER — Ambulatory Visit: Payer: Medicare HMO | Attending: Internal Medicine

## 2019-04-20 DIAGNOSIS — Z23 Encounter for immunization: Secondary | ICD-10-CM

## 2019-04-20 NOTE — Progress Notes (Signed)
   Covid-19 Vaccination Clinic  Name:  Heather [name]    MRN: [id number] DOB: 07-Mar-1945  04/20/2019  Heather [name] was observed post Covid-19 immunization for 15 minutes without incidence. She was provided with Vaccine Information Sheet and instruction to access the V-Safe system.   Heather [name] was instructed to call 911 with any severe reactions post vaccine: Marland Kitchen Difficulty breathing  . Swelling of your face and throat  . A fast heartbeat  . A bad rash all over your body  . Dizziness and weakness    Immunizations Administered    Name Date Dose VIS Date Route   Pfizer COVID-19 Vaccine 04/20/2019  3:07 PM 0.3 mL 01/30/2019 Intramuscular   Manufacturer: [employer], [employer]   Lot: [id number]   NDC: [address]-7

## 2019-05-13 ENCOUNTER — Ambulatory Visit: Payer: Medicare HMO | Attending: Internal Medicine

## 2019-05-13 DIAGNOSIS — Z23 Encounter for immunization: Secondary | ICD-10-CM

## 2019-05-13 NOTE — Progress Notes (Signed)
   Covid-19 Vaccination Clinic  Name:  Heather [name]    MRN: [id number] DOB: June 06, 1945  05/13/2019  [name] was observed post Covid-19 immunization for 15 minutes without incident. She was provided with Vaccine Information Sheet and instruction to access the V-Safe system.   [name] was instructed to call 911 with any severe reactions post vaccine: Marland Kitchen Difficulty breathing  . Swelling of face and throat  . A fast heartbeat  . A bad rash all over body  . Dizziness and weakness   Immunizations Administered    Name Date Dose VIS Date Route   Pfizer COVID-19 Vaccine 05/13/2019  3:05 PM 0.3 mL 01/30/2019 Intramuscular   Manufacturer: [employer], [employer]   Lot: [id number]   NDC: [address]-9

## 2019-12-29 ENCOUNTER — Ambulatory Visit (INDEPENDENT_AMBULATORY_CARE_PROVIDER_SITE_OTHER): Payer: Medicare HMO | Admitting: Internal Medicine

## 2019-12-29 ENCOUNTER — Other Ambulatory Visit: Payer: Self-pay

## 2019-12-29 ENCOUNTER — Ambulatory Visit (INDEPENDENT_AMBULATORY_CARE_PROVIDER_SITE_OTHER): Payer: Medicare HMO

## 2019-12-29 ENCOUNTER — Encounter: Payer: Self-pay | Admitting: Internal Medicine

## 2019-12-29 DIAGNOSIS — U071 COVID-19: Secondary | ICD-10-CM

## 2019-12-29 DIAGNOSIS — J1282 Pneumonia due to coronavirus disease 2019: Secondary | ICD-10-CM | POA: Diagnosis not present

## 2019-12-29 DIAGNOSIS — I10 Essential (primary) hypertension: Secondary | ICD-10-CM

## 2019-12-29 DIAGNOSIS — R053 Chronic cough: Secondary | ICD-10-CM

## 2019-12-29 MED ORDER — OMEPRAZOLE 40 MG PO CPDR: 40.0000 mg | DELAYED_RELEASE_CAPSULE | Freq: Every day | ORAL | 2 refills | Status: DC

## 2019-12-29 MED ORDER — VALSARTAN 80 MG PO TABS: 80.0000 mg | ORAL_TABLET | Freq: Every day | ORAL | 11 refills | Status: DC

## 2019-12-29 NOTE — Assessment & Plan Note (Signed)
Dx Nov 2020  - minimal residual RLL ? From superimposed HCAP   No symptoms at this point attributable to the changes still seen at R base but clearly not present on CTa  CTa 11/17/18   F/u at next ov ? Needs repeat CT?

## 2019-12-29 NOTE — Progress Notes (Signed)
Heather [name], female    DOB: [birth year],   MRN: [id number]   Brief patient profile:  74 yobf quit smoking around 2007 with pattern of sinus infections at least several times a year never seen specialist goes away with abx pattern in 1980s then cough since admit with COVID 19 in November 2020  And rx with pred/abx only a smidge better.    History of Present Illness  12/29/2019  Pulmonary/ 1st office eval/[facility]  On lisinopril as recently as 12/17/19  Chief Complaint  Patient presents with  . Consult    pt is here chronic cough, and fatigue, and sob on exertion  Dyspnea:  Holds on cart due to balance  Cough:  Dry hack assoc with hoarseness  Sleep:  Worse at hs sleeps flat / two pillows  SABA use: albuterol  Not helping  No obvious day to day or daytime variability or assoc excess/ purulent sputum or mucus plugs or hemoptysis or cp or chest tightness, subjective wheeze or overt sinus or hb symptoms.   sleeping without nocturnal  or early am exacerbation  of respiratory  c/o's or need for noct saba. Also denies any obvious fluctuation of symptoms with weather or environmental changes or other aggravating or alleviating factors except as outlined above   No unusual exposure hx or h/o childhood pna/ asthma or knowledge of premature birth.  Current Allergies, Complete Past Medical History, Past Surgical History, Family History, and Social History were reviewed in [employer] record.  ROS  The following are not active complaints unless bolded Hoarseness, sore throat, dysphagia, dental problems, itching, sneezing,  nasal congestion or discharge of excess mucus or purulent secretions, ear ache,   fever, chills, sweats, unintended wt loss or wt gain, classically pleuritic or exertional cp,  orthopnea pnd or arm/hand swelling  or leg swelling, presyncope, palpitations, abdominal pain, anorexia, nausea, vomiting, diarrhea  or change in bowel habits or change in bladder  habits, change in stools or change in urine, dysuria, hematuria,  rash, arthralgias, visual complaints, headache, numbness, weakness or ataxia or problems with walking or coordination,  change in mood or  memory.           Past Medical History:  Diagnosis Date  . Anxiety   . Closed right ankle fracture   . Complication of anesthesia   . Depression   . Headache   . Hyperlipidemia   . Hypertension   . IBS (irritable bowel syndrome)   . PONV (postoperative nausea and vomiting)   . Seasonal allergies     Outpatient Medications Prior to Visit -  - NOTE:   Unable to verify as accurately reflecting what pt takes     Medication Sig Dispense Refill  . baclofen (LIORESAL) 10 MG tablet Take 1 tablet (10 mg total) by mouth 2 (two) times daily as needed for muscle spasms. 15 each 0  . Cholecalciferol (VITAMIN D) 50 MCG (2000 UT) tablet Take 2,000 Units by mouth daily.    . fexofenadine (ALLEGRA) 180 MG tablet Take 180 mg by mouth daily.    Marland Kitchen aspirin EC 81 MG tablet Take 1 tablet (81 mg total) by mouth 2 (two) times daily. For DVT prophylaxis for 30 days after surgery. 60 tablet 0  . buPROPion (WELLBUTRIN XL) 300 MG 24 hr tablet Take 300 mg by mouth daily.    . celecoxib (CELEBREX) 200 MG capsule Take 200 mg by mouth 2 (two) times daily.    Marland Kitchen escitalopram (LEXAPRO) 10  MG tablet Take 10 mg by mouth daily.    . fluticasone (FLONASE) 50 MCG/ACT nasal spray Place into both nostrils daily.    Marland Kitchen gabapentin (NEURONTIN) 300 MG capsule Take 300 mg by mouth 3 (three) times daily.    Marland Kitchen lisinopril (PRINIVIL,ZESTRIL) 10 MG tablet Take 10 mg by mouth daily.    . montelukast (SINGULAIR) 10 MG tablet Take 10 mg by mouth at bedtime.    . ondansetron (ZOFRAN) 4 MG tablet Take 1 tablet (4 mg total) by mouth 2 (two) times daily as needed for nausea or vomiting. 10 tablet 0  . rosuvastatin (CRESTOR) 10 MG tablet Take 10 mg by mouth daily.    Marland Kitchen dicyclomine (BENTYL) 20 MG tablet Take 20 mg by mouth every 6 (six) hours.     . docusate sodium (COLACE) 100 MG capsule Take 1 capsule (100 mg total) by mouth 2 (two) times daily. To prevent constipation while taking pain medication. 30 capsule 0      Objective:     BP 126/82 (BP Location: Left Arm, Patient Position: Sitting, Cuff Size: Normal)   Pulse 77   Temp 97.7 F (36.5 C) (Oral)   Ht 5\' 3"  (1.6 m)   Wt 188 lb 6.4 oz (85.5 kg)   SpO2 94%   BMI 33.37 kg/m   SpO2: 94 %   Hoarse amb wf nad    HEENT : pt wearing mask not removed for exam due to covid -19 concerns.    NECK :  without JVD/Nodes/TM/ nl carotid upstrokes bilaterally   LUNGS: no acc muscle use,  Nl contour chest which is clear to A and P bilaterally without cough on insp or exp maneuvers   CV:  RRR  no s3 or murmur or increase in P2, and no edema   ABD:  soft and nontender with nl inspiratory excursion in the supine position. No bruits or organomegaly appreciated, bowel sounds nl  MS:  Nl gait/ ext warm without deformities, calf tenderness, cyanosis or clubbing No obvious joint restrictions   SKIN: warm and dry without lesions    NEURO:  alert, approp, nl sensorium with  no motor or cerebellar deficits apparent.    CXR PA and Lateral:   12/29/2019 :    I personally reviewed images and  impression as follows:   Residual changes R base date back to covid nov 2020 as CTa 11/17/18 is nl        Assessment   Chronic cough Onset nov 2020 with covid  - d/c lisinopril 12/29/2019   Most likely this is not new onset asthma p covid but rather Upper airway cough syndrome (previously labeled PNDS),  is so named because it's frequently impossible to sort out how much is  CR/sinusitis with freq throat clearing (which can be related to primary GERD)   vs  causing  secondary (" extra esophageal")  GERD from wide swings in gastric pressure that occur with throat clearing, often  promoting self use of mint and menthol lozenges that reduce the lower esophageal sphincter tone and exacerbate the  problem further in a cyclical fashion.   These are the same pts (now being labeled as having "irritable larynx syndrome" by some cough centers) who not infrequently have a history of having failed to tolerate ace inhibitors,  dry powder inhalers or biphosphonates or report having atypical/extraesophageal reflux symptoms that don't respond to standard doses of PPI  and are easily confused as having aecopd or asthma flares by even experienced  allergists/ pulmonologists (myself included).   >>> first steps are max rx for gerd and try off acei then regroup in 6 weeks      Essential hypertension D/c acei 12/29/2019   In the best review of chronic cough to date ( NEJM 2016 375 1610-9604) ,  ACEi are now felt to cause cough in up to  20% of pts which is a 4 fold increase from previous reports and does not include the variety of non-specific complaints we see in pulmonary clinic in pts on ACEi but previously attributed to another dx like  Copd/asthma and  include PNDS, throat and chest congestion, "bronchitis", unexplained dyspnea and noct "strangling" sensations, and b, but also  atypical /refractory GERD symptoms like dysphagia and "bad heartburn"   The only way I know  to prove this is not an "ACEi Case" is a trial off ACEi x a minimum of 6 weeks then regroup.   Try diovan 80 mg one daily and return in 6 weeks      Pneumonia due to COVID-19 virus Dx Nov 2020  - minimal residual RLL ? From superimposed HCAP   No symptoms at this point attributable to the changes still seen at R base but clearly not present on CTa  CTa 11/17/18  F/u at next ov ? Needs repeat CT?    - can certainly hold off for now      Each maintenance medication was reviewed in detail including emphasizing most importantly the difference between maintenance and prns and under what circumstances the prns are to be triggered using an action plan format where appropriate.  Total time for H and P, chart review, counseling,  teaching device and generating customized AVS unique to this office visit / charting > 60 min         Sandrea Hughs, MD 12/29/2019

## 2019-12-29 NOTE — Patient Instructions (Addendum)
Stop lisinopril   Start diovan (valsartan) 80 mg one daily   Change omeprazole to 40 mg Take 30- 60 min before your first and last meals of the day   GERD (REFLUX)  is an extremely common cause of respiratory symptoms just like yours , many times with no obvious heartburn at all.    It can be treated with medication, but also with lifestyle changes including elevation of the head of your bed (ideally with 6 -8inch blocks under the headboard of your bed),  Smoking cessation, avoidance of late meals, excessive alcohol, and avoid fatty foods, chocolate, peppermint, colas, red wine, and acidic juices such as orange juice.  NO MINT OR MENTHOL PRODUCTS SO NO COUGH DROPS  USE SUGARLESS CANDY INSTEAD (Jolley ranchers or Stover's or Life Savers) or even ice chips will also do - the key is to swallow to prevent all throat clearing. NO OIL BASED VITAMINS - use powdered substitutes.  Avoid fish oil when coughing.    Please remember to go to the  x-ray department  for your tests - we will call you with the results when they are available     Please schedule a follow up office visit in 6 weeks, call sooner if needed

## 2019-12-29 NOTE — Assessment & Plan Note (Signed)
Onset nov 2020 with covid  - d/c lisinopril 12/29/2019   Most likely this is not new onset asthma p covid but rather Upper airway cough syndrome (previously labeled PNDS),  is so named because it's frequently impossible to sort out how much is  CR/sinusitis with freq throat clearing (which can be related to primary GERD)   vs  causing  secondary (" extra esophageal")  GERD from wide swings in gastric pressure that occur with throat clearing, often  promoting self use of mint and menthol lozenges that reduce the lower esophageal sphincter tone and exacerbate the problem further in a cyclical fashion.   These are the same pts (now being labeled as having "irritable larynx syndrome" by some cough centers) who not infrequently have a history of having failed to tolerate ace inhibitors,  dry powder inhalers or biphosphonates or report having atypical/extraesophageal reflux symptoms that don't respond to standard doses of PPI  and are easily confused as having aecopd or asthma flares by even experienced allergists/ pulmonologists (myself included).   >>> first steps are max rx for gerd and try off acei then regroup in 6 weeks

## 2019-12-29 NOTE — Assessment & Plan Note (Addendum)
D/c acei 12/29/2019   In the best review of chronic cough to date ( NEJM 2016 375 3202-3343) ,  ACEi are now felt to cause cough in up to  20% of pts which is a 4 fold increase from previous reports and does not include the variety of non-specific complaints we see in pulmonary clinic in pts on ACEi but previously attributed to another dx like  Copd/asthma and  include PNDS, throat and chest congestion, "bronchitis", unexplained dyspnea and noct "strangling" sensations, and b, but also  atypical /refractory GERD symptoms like dysphagia and "bad heartburn"   The only way I know  to prove this is not an "ACEi Case" is a trial off ACEi x a minimum of 6 weeks then regroup.   Try diovan 80 mg one daily and return in 6 weeks          Each maintenance medication was reviewed in detail including emphasizing most importantly the difference between maintenance and prns and under what circumstances the prns are to be triggered using an action plan format where appropriate.  Total time for H and P, chart review, counseling, teaching device and generating customized AVS unique to this office visit / charting > 60 min

## 2019-12-30 ENCOUNTER — Telehealth: Payer: Self-pay

## 2019-12-30 NOTE — Telephone Encounter (Signed)
Pt was informed of chest xray results

## 2020-05-14 ENCOUNTER — Inpatient Hospital Stay (HOSPITAL_COMMUNITY)
Admission: EM | Admit: 2020-05-14 | Discharge: 2020-05-25 | DRG: 518 | Disposition: A | Discharge status: Skilled Nursing Facility | Payer: Medicare HMO | Attending: Neurosurgery | Admitting: Neurosurgery

## 2020-05-14 ENCOUNTER — Encounter (HOSPITAL_COMMUNITY): Payer: Self-pay | Admitting: Emergency Medicine

## 2020-05-14 ENCOUNTER — Emergency Department (HOSPITAL_COMMUNITY): Payer: Medicare HMO

## 2020-05-14 DIAGNOSIS — S34103A Unspecified injury to L3 level of lumbar spinal cord, initial encounter: Secondary | ICD-10-CM | POA: Diagnosis present

## 2020-05-14 DIAGNOSIS — Z419 Encounter for procedure for purposes other than remedying health state, unspecified: Secondary | ICD-10-CM

## 2020-05-14 DIAGNOSIS — I10 Essential (primary) hypertension: Secondary | ICD-10-CM | POA: Diagnosis not present

## 2020-05-14 DIAGNOSIS — M5416 Radiculopathy, lumbar region: Secondary | ICD-10-CM | POA: Diagnosis not present

## 2020-05-14 DIAGNOSIS — M069 Rheumatoid arthritis, unspecified: Secondary | ICD-10-CM | POA: Diagnosis not present

## 2020-05-14 DIAGNOSIS — Y92410 Unspecified street and highway as the place of occurrence of the external cause: Secondary | ICD-10-CM | POA: Diagnosis not present

## 2020-05-14 DIAGNOSIS — Z96653 Presence of artificial knee joint, bilateral: Secondary | ICD-10-CM | POA: Diagnosis present

## 2020-05-14 DIAGNOSIS — M5442 Lumbago with sciatica, left side: Secondary | ICD-10-CM

## 2020-05-14 DIAGNOSIS — Z20822 Contact with and (suspected) exposure to covid-19: Secondary | ICD-10-CM | POA: Diagnosis not present

## 2020-05-14 DIAGNOSIS — E785 Hyperlipidemia, unspecified: Secondary | ICD-10-CM | POA: Diagnosis present

## 2020-05-14 DIAGNOSIS — S39012A Strain of muscle, fascia and tendon of lower back, initial encounter: Principal | ICD-10-CM

## 2020-05-14 DIAGNOSIS — S32032A Unstable burst fracture of third lumbar vertebra, initial encounter for closed fracture: Principal | ICD-10-CM | POA: Diagnosis present

## 2020-05-14 DIAGNOSIS — Z87891 Personal history of nicotine dependence: Secondary | ICD-10-CM | POA: Diagnosis not present

## 2020-05-14 DIAGNOSIS — M48061 Spinal stenosis, lumbar region without neurogenic claudication: Secondary | ICD-10-CM | POA: Diagnosis present

## 2020-05-14 DIAGNOSIS — S32001A Stable burst fracture of unspecified lumbar vertebra, initial encounter for closed fracture: Secondary | ICD-10-CM | POA: Diagnosis present

## 2020-05-14 LAB — COMPREHENSIVE METABOLIC PANEL
ALT: 12 U/L (ref 0–44)
AST: 22 U/L (ref 15–41)
Albumin: 3.6 g/dL (ref 3.5–5.0)
Alkaline Phosphatase: 85 U/L (ref 38–126)
Anion gap: 3 — ABNORMAL LOW (ref 5–15)
BUN: 16 mg/dL (ref 8–23)
CO2: 28 mmol/L (ref 22–32)
Calcium: 8.7 mg/dL — ABNORMAL LOW (ref 8.9–10.3)
Chloride: 108 mmol/L (ref 98–111)
Creatinine, Ser: 0.88 mg/dL (ref 0.44–1.00)
GFR, Estimated: >60 mL/min (ref >60)
Glucose, Bld: 99 mg/dL (ref 70–99)
Potassium: 3.7 mmol/L (ref 3.5–5.1)
Sodium: 139 mmol/L (ref 135–145)
Total Bilirubin: 0.4 mg/dL (ref 0.3–1.2)
Total Protein: 6.3 g/dL — ABNORMAL LOW (ref 6.5–8.1)

## 2020-05-14 LAB — CBC WITH DIFFERENTIAL/PLATELET
Abs Immature Granulocytes: 0.05 10*3/uL (ref 0.00–0.07)
Basophils Absolute: 0 10*3/uL (ref 0.0–0.1)
Basophils Relative: 0 %
Eosinophils Absolute: 0.2 10*3/uL (ref 0.0–0.5)
Eosinophils Relative: 1 %
HCT: 40 % (ref 36.0–46.0)
Hemoglobin: 12.9 g/dL (ref 12.0–15.0)
Immature Granulocytes: 0 %
Lymphocytes Relative: 22 %
Lymphs Abs: 2.5 10*3/uL (ref 0.7–4.0)
MCH: 27.2 pg (ref 26.0–34.0)
MCHC: 32.3 g/dL (ref 30.0–36.0)
MCV: 84.2 fL (ref 80.0–100.0)
Monocytes Absolute: 0.8 10*3/uL (ref 0.1–1.0)
Monocytes Relative: 7 %
Neutro Abs: 7.9 10*3/uL — ABNORMAL HIGH (ref 1.7–7.7)
Neutrophils Relative %: 70 %
Platelets: 169 10*3/uL (ref 150–400)
RBC: 4.75 MIL/uL (ref 3.87–5.11)
RDW: 15.5 % (ref 11.5–15.5)
WBC: 11.4 10*3/uL — ABNORMAL HIGH (ref 4.0–10.5)
nRBC: 0 % (ref 0.0–0.2)

## 2020-05-14 LAB — RESP PANEL BY RT-PCR (FLU A&B, COVID) ARPGX2
Influenza A by PCR: NEGATIVE
Influenza B by PCR: NEGATIVE
SARS Coronavirus 2 by RT PCR: NEGATIVE

## 2020-05-14 LAB — TYPE AND SCREEN
ABO/RH(D): O NEG
Antibody Screen: NEGATIVE

## 2020-05-14 MED ORDER — HYDROCODONE-ACETAMINOPHEN 5-325 MG PO TABS
1.0000 | ORAL_TABLET | Freq: Once | ORAL | Status: AC
  Administered 2020-05-14: 1 via ORAL
  Filled 2020-05-14: qty 1

## 2020-05-14 MED ORDER — MORPHINE SULFATE (PF) 4 MG/ML IV SOLN
4.0000 mg | Freq: Once | INTRAVENOUS | Status: AC
  Administered 2020-05-14: 4 mg via INTRAMUSCULAR
  Filled 2020-05-14: qty 1

## 2020-05-14 MED ORDER — SODIUM CHLORIDE 0.9 % IV SOLN: INTRAVENOUS | Status: DC

## 2020-05-14 MED ORDER — ONDANSETRON HCL 4 MG/2ML IJ SOLN
4.0000 mg | Freq: Four times a day (QID) | INTRAMUSCULAR | Status: DC | PRN
  Administered 2020-05-21: 4 mg via INTRAVENOUS
  Filled 2020-05-14: qty 2

## 2020-05-14 MED ORDER — FLEET ENEMA 7-19 GM/118ML RE ENEM: 1.0000 | ENEMA | Freq: Once | RECTAL | Status: DC | PRN

## 2020-05-14 MED ORDER — ACETAMINOPHEN 325 MG PO TABS: 650.0000 mg | ORAL_TABLET | Freq: Four times a day (QID) | ORAL | Status: DC | PRN

## 2020-05-14 MED ORDER — ACETAMINOPHEN 650 MG RE SUPP: 650.0000 mg | Freq: Four times a day (QID) | RECTAL | Status: DC | PRN

## 2020-05-14 MED ORDER — HYDROMORPHONE HCL 1 MG/ML IJ SOLN
0.5000 mg | INTRAMUSCULAR | Status: DC | PRN
Start: 2020-05-14 — End: 2020-05-15
  Administered 2020-05-14 – 2020-05-15 (×3): 1 mg via INTRAVENOUS
  Filled 2020-05-14 (×3): qty 1

## 2020-05-14 MED ORDER — POLYETHYLENE GLYCOL 3350 17 G PO PACK: 17.0000 g | PACK | Freq: Every day | ORAL | Status: DC | PRN

## 2020-05-14 MED ORDER — BISACODYL 10 MG RE SUPP
10.0000 mg | Freq: Every day | RECTAL | Status: DC | PRN
Start: 2020-05-14 — End: 2020-05-26

## 2020-05-14 MED ORDER — OXYCODONE HCL 5 MG PO TABS: 5.0000 mg | ORAL_TABLET | ORAL | Status: DC | PRN

## 2020-05-14 MED ORDER — SODIUM CHLORIDE 0.9% FLUSH
3.0000 mL | Freq: Two times a day (BID) | INTRAVENOUS | Status: DC
  Administered 2020-05-14: 3 mL via INTRAVENOUS

## 2020-05-14 MED ORDER — SENNA 8.6 MG PO TABS
1.0000 | ORAL_TABLET | Freq: Two times a day (BID) | ORAL | Status: DC
  Administered 2020-05-15 – 2020-05-23 (×15): 8.6 mg via ORAL
  Filled 2020-05-14 (×17): qty 1

## 2020-05-14 MED ORDER — METHOCARBAMOL 1000 MG/10ML IJ SOLN
500.0000 mg | Freq: Four times a day (QID) | INTRAVENOUS | Status: DC | PRN
  Administered 2020-05-14: 500 mg via INTRAVENOUS
  Filled 2020-05-14 (×2): qty 5

## 2020-05-14 MED ORDER — DOCUSATE SODIUM 100 MG PO CAPS
100.0000 mg | ORAL_CAPSULE | Freq: Two times a day (BID) | ORAL | Status: DC
  Administered 2020-05-15 – 2020-05-23 (×14): 100 mg via ORAL
  Filled 2020-05-14 (×17): qty 1

## 2020-05-14 MED ORDER — MUPIROCIN 2 % EX OINT
1.0000 "application " | TOPICAL_OINTMENT | Freq: Two times a day (BID) | CUTANEOUS | Status: AC
  Administered 2020-05-14 – 2020-05-19 (×9): 1 via NASAL
  Filled 2020-05-14 (×3): qty 22

## 2020-05-14 MED ORDER — ONDANSETRON HCL 4 MG PO TABS: 4.0000 mg | ORAL_TABLET | Freq: Four times a day (QID) | ORAL | Status: DC | PRN

## 2020-05-14 NOTE — ED Notes (Signed)
Ortho tech at bedside 

## 2020-05-14 NOTE — Progress Notes (Signed)
Orthopedic Tech Progress Note Patient Details:  Heather [name] 09/05/1945 [id number]  Ortho Devices Type of Ortho Device: Lumbar corsett Ortho Device/Splint Interventions: Ordered,Application,Adjustment   Post Interventions Patient Tolerated: Well Instructions Provided: Adjustment of device,Care of device,Poper ambulation with device   Gerald Stabs 05/14/2020, 6:36 PM

## 2020-05-14 NOTE — ED Notes (Signed)
Pt to radiology.
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
## 2020-05-14 NOTE — ED Notes (Signed)
Neurosurgery NP at bedside conversing with pt and pt's family in room and via phone.
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
## 2020-05-14 NOTE — ED Notes (Signed)
Ortho tech called for TLSO brace 

## 2020-05-15 ENCOUNTER — Other Ambulatory Visit: Payer: Self-pay

## 2020-05-15 ENCOUNTER — Encounter (HOSPITAL_COMMUNITY): Payer: Self-pay | Admitting: Neurosurgery

## 2020-05-15 ENCOUNTER — Inpatient Hospital Stay (HOSPITAL_COMMUNITY): Payer: Medicare HMO

## 2020-05-15 ENCOUNTER — Inpatient Hospital Stay (HOSPITAL_COMMUNITY): Payer: Medicare HMO | Admitting: Anesthesiology

## 2020-05-15 ENCOUNTER — Encounter (HOSPITAL_COMMUNITY)
Admission: EM | Disposition: A | Discharge status: Skilled Nursing Facility | Payer: Self-pay | Source: Home / Self Care | Attending: Neurosurgery

## 2020-05-15 HISTORY — PX: LUMBAR PERCUTANEOUS PEDICLE SCREW 2 LEVEL: SHX5561

## 2020-05-15 HISTORY — PX: LUMBAR LAMINECTOMY/DECOMPRESSION MICRODISCECTOMY: SHX5026

## 2020-05-15 LAB — CBC
HCT: 39.8 % (ref 36.0–46.0)
Hemoglobin: 12.9 g/dL (ref 12.0–15.0)
MCH: 27.3 pg (ref 26.0–34.0)
MCHC: 32.4 g/dL (ref 30.0–36.0)
MCV: 84.1 fL (ref 80.0–100.0)
Platelets: 151 10*3/uL (ref 150–400)
RBC: 4.73 MIL/uL (ref 3.87–5.11)
RDW: 15.4 % (ref 11.5–15.5)
WBC: 11.1 10*3/uL — ABNORMAL HIGH (ref 4.0–10.5)
nRBC: 0 % (ref 0.0–0.2)

## 2020-05-15 LAB — SURGICAL PCR SCREEN
MRSA, PCR: NEGATIVE
Staphylococcus aureus: POSITIVE — AB

## 2020-05-15 LAB — ABO/RH: ABO/RH(D): O NEG

## 2020-05-15 SURGERY — LUMBAR PERCUTANEOUS PEDICLE SCREW 2 LEVEL
Anesthesia: General | Site: Back

## 2020-05-15 MED ORDER — LIDOCAINE 2% (20 MG/ML) 5 ML SYRINGE
INTRAMUSCULAR | Status: AC
  Filled 2020-05-15: qty 5

## 2020-05-15 MED ORDER — PHENYLEPHRINE HCL-NACL 10-0.9 MG/250ML-% IV SOLN
INTRAVENOUS | Status: DC | PRN
  Administered 2020-05-15: 50 ug/min via INTRAVENOUS

## 2020-05-15 MED ORDER — SODIUM CHLORIDE 0.9 % IV SOLN: 250.0000 mL | INTRAVENOUS | Status: DC

## 2020-05-15 MED ORDER — DOCUSATE SODIUM 100 MG PO CAPS: 100.0000 mg | ORAL_CAPSULE | Freq: Two times a day (BID) | ORAL | Status: DC

## 2020-05-15 MED ORDER — SODIUM CHLORIDE 0.9% FLUSH
3.0000 mL | Freq: Two times a day (BID) | INTRAVENOUS | Status: DC
  Administered 2020-05-15 – 2020-05-24 (×20): 3 mL via INTRAVENOUS

## 2020-05-15 MED ORDER — SUCCINYLCHOLINE CHLORIDE 200 MG/10ML IV SOSY
PREFILLED_SYRINGE | INTRAVENOUS | Status: AC
  Filled 2020-05-15: qty 10

## 2020-05-15 MED ORDER — BUPIVACAINE HCL (PF) 0.5 % IJ SOLN
INTRAMUSCULAR | Status: AC
  Filled 2020-05-15: qty 30

## 2020-05-15 MED ORDER — ACETAMINOPHEN 650 MG RE SUPP: 650.0000 mg | RECTAL | Status: DC | PRN

## 2020-05-15 MED ORDER — MENTHOL 3 MG MT LOZG: 1.0000 | LOZENGE | OROMUCOSAL | Status: DC | PRN

## 2020-05-15 MED ORDER — ESCITALOPRAM OXALATE 10 MG PO TABS: 10.0000 mg | ORAL_TABLET | Freq: Every day | ORAL | Status: DC

## 2020-05-15 MED ORDER — THROMBIN 20000 UNITS EX SOLR
CUTANEOUS | Status: AC
  Filled 2020-05-15: qty 20000

## 2020-05-15 MED ORDER — THROMBIN 5000 UNITS EX SOLR
CUTANEOUS | Status: AC
  Filled 2020-05-15: qty 5000

## 2020-05-15 MED ORDER — ROCURONIUM BROMIDE 10 MG/ML (PF) SYRINGE
PREFILLED_SYRINGE | INTRAVENOUS | Status: AC
  Filled 2020-05-15: qty 10

## 2020-05-15 MED ORDER — METHOCARBAMOL 1000 MG/10ML IJ SOLN
500.0000 mg | Freq: Four times a day (QID) | INTRAVENOUS | Status: DC | PRN
  Filled 2020-05-15: qty 5

## 2020-05-15 MED ORDER — LIDOCAINE 2% (20 MG/ML) 5 ML SYRINGE
INTRAMUSCULAR | Status: DC | PRN
  Administered 2020-05-15: 60 mg via INTRAVENOUS

## 2020-05-15 MED ORDER — PROPOFOL 500 MG/50ML IV EMUL
INTRAVENOUS | Status: DC | PRN
  Administered 2020-05-15: 50 ug/kg/min via INTRAVENOUS

## 2020-05-15 MED ORDER — METHOCARBAMOL 500 MG PO TABS
500.0000 mg | ORAL_TABLET | Freq: Four times a day (QID) | ORAL | Status: DC | PRN
  Administered 2020-05-15 – 2020-05-22 (×14): 500 mg via ORAL
  Filled 2020-05-15 (×14): qty 1

## 2020-05-15 MED ORDER — SODIUM CHLORIDE 0.9% FLUSH: 3.0000 mL | INTRAVENOUS | Status: DC | PRN

## 2020-05-15 MED ORDER — CEFAZOLIN SODIUM-DEXTROSE 2-3 GM-%(50ML) IV SOLR
INTRAVENOUS | Status: DC | PRN
  Administered 2020-05-15: 2 g via INTRAVENOUS

## 2020-05-15 MED ORDER — KETOROLAC TROMETHAMINE 15 MG/ML IJ SOLN
7.5000 mg | Freq: Four times a day (QID) | INTRAMUSCULAR | Status: AC
  Administered 2020-05-15 – 2020-05-16 (×3): 7.5 mg via INTRAVENOUS
  Filled 2020-05-15 (×3): qty 1

## 2020-05-15 MED ORDER — ONDANSETRON HCL 4 MG PO TABS: 4.0000 mg | ORAL_TABLET | Freq: Four times a day (QID) | ORAL | Status: DC | PRN

## 2020-05-15 MED ORDER — DEXAMETHASONE SODIUM PHOSPHATE 10 MG/ML IJ SOLN
INTRAMUSCULAR | Status: DC | PRN
  Administered 2020-05-15: 10 mg via INTRAVENOUS

## 2020-05-15 MED ORDER — MONTELUKAST SODIUM 10 MG PO TABS
10.0000 mg | ORAL_TABLET | Freq: Every day | ORAL | Status: DC
  Administered 2020-05-15 – 2020-05-24 (×10): 10 mg via ORAL
  Filled 2020-05-15 (×10): qty 1

## 2020-05-15 MED ORDER — ROSUVASTATIN CALCIUM 5 MG PO TABS
10.0000 mg | ORAL_TABLET | Freq: Every day | ORAL | Status: DC
  Administered 2020-05-15 – 2020-05-25 (×11): 10 mg via ORAL
  Filled 2020-05-15 (×11): qty 2

## 2020-05-15 MED ORDER — ROCURONIUM BROMIDE 10 MG/ML (PF) SYRINGE
PREFILLED_SYRINGE | INTRAVENOUS | Status: DC | PRN
  Administered 2020-05-15: 40 mg via INTRAVENOUS

## 2020-05-15 MED ORDER — GABAPENTIN 300 MG PO CAPS
300.0000 mg | ORAL_CAPSULE | Freq: Every day | ORAL | Status: DC
  Administered 2020-05-16 – 2020-05-25 (×10): 300 mg via ORAL
  Filled 2020-05-15 (×10): qty 1

## 2020-05-15 MED ORDER — POLYETHYLENE GLYCOL 3350 17 G PO PACK: 17.0000 g | PACK | Freq: Every day | ORAL | Status: DC | PRN

## 2020-05-15 MED ORDER — ESCITALOPRAM OXALATE 10 MG PO TABS
20.0000 mg | ORAL_TABLET | Freq: Every day | ORAL | Status: DC
  Administered 2020-05-15 – 2020-05-25 (×11): 20 mg via ORAL
  Filled 2020-05-15 (×11): qty 2

## 2020-05-15 MED ORDER — FENTANYL CITRATE (PF) 250 MCG/5ML IJ SOLN
INTRAMUSCULAR | Status: DC | PRN
  Administered 2020-05-15 (×2): 50 ug via INTRAVENOUS
  Administered 2020-05-15 (×2): 100 ug via INTRAVENOUS

## 2020-05-15 MED ORDER — PHENYLEPHRINE 40 MCG/ML (10ML) SYRINGE FOR IV PUSH (FOR BLOOD PRESSURE SUPPORT)
PREFILLED_SYRINGE | INTRAVENOUS | Status: AC
  Filled 2020-05-15: qty 10

## 2020-05-15 MED ORDER — ASPIRIN EC 81 MG PO TBEC: 81.0000 mg | DELAYED_RELEASE_TABLET | Freq: Once | ORAL | Status: DC

## 2020-05-15 MED ORDER — KCL IN DEXTROSE-NACL 20-5-0.45 MEQ/L-%-% IV SOLN: INTRAVENOUS | Status: DC

## 2020-05-15 MED ORDER — FENTANYL CITRATE (PF) 250 MCG/5ML IJ SOLN
INTRAMUSCULAR | Status: AC
  Filled 2020-05-15: qty 5

## 2020-05-15 MED ORDER — THROMBIN 20000 UNITS EX SOLR
OROMUCOSAL | Status: DC | PRN
  Administered 2020-05-15: 20 mL via TOPICAL

## 2020-05-15 MED ORDER — LIDOCAINE-EPINEPHRINE 1 %-1:100000 IJ SOLN
INTRAMUSCULAR | Status: AC
  Filled 2020-05-15: qty 1

## 2020-05-15 MED ORDER — 0.9 % SODIUM CHLORIDE (POUR BTL) OPTIME
TOPICAL | Status: DC | PRN
  Administered 2020-05-15: 1000 mL

## 2020-05-15 MED ORDER — CELECOXIB 200 MG PO CAPS
200.0000 mg | ORAL_CAPSULE | Freq: Two times a day (BID) | ORAL | Status: DC
  Administered 2020-05-17 – 2020-05-25 (×17): 200 mg via ORAL
  Filled 2020-05-15 (×17): qty 1

## 2020-05-15 MED ORDER — CHLORHEXIDINE GLUCONATE CLOTH 2 % EX PADS
6.0000 | MEDICATED_PAD | Freq: Every day | CUTANEOUS | Status: DC
  Administered 2020-05-16 – 2020-05-19 (×4): 6 via TOPICAL

## 2020-05-15 MED ORDER — CHLORHEXIDINE GLUCONATE 0.12 % MT SOLN
15.0000 mL | Freq: Once | OROMUCOSAL | Status: AC
  Administered 2020-05-15: 15 mL via OROMUCOSAL
  Filled 2020-05-15: qty 15

## 2020-05-15 MED ORDER — LACTATED RINGERS IV SOLN: INTRAVENOUS | Status: DC | PRN

## 2020-05-15 MED ORDER — FLUTICASONE PROPIONATE 50 MCG/ACT NA SUSP
2.0000 | Freq: Every day | NASAL | Status: DC
  Administered 2020-05-15 – 2020-05-25 (×10): 2 via NASAL
  Filled 2020-05-15: qty 16

## 2020-05-15 MED ORDER — BUPIVACAINE LIPOSOME 1.3 % IJ SUSP
INTRAMUSCULAR | Status: DC | PRN
  Administered 2020-05-15: 20 mL

## 2020-05-15 MED ORDER — TACROLIMUS 0.03 % EX OINT: TOPICAL_OINTMENT | Freq: Every day | CUTANEOUS | Status: DC

## 2020-05-15 MED ORDER — DICYCLOMINE HCL 20 MG PO TABS
20.0000 mg | ORAL_TABLET | Freq: Four times a day (QID) | ORAL | Status: DC | PRN
  Administered 2020-05-21: 20 mg via ORAL
  Filled 2020-05-15 (×2): qty 1

## 2020-05-15 MED ORDER — BISACODYL 10 MG RE SUPP
10.0000 mg | Freq: Every day | RECTAL | Status: DC | PRN
Start: 2020-05-15 — End: 2020-05-15

## 2020-05-15 MED ORDER — ONDANSETRON HCL 4 MG/2ML IJ SOLN: 4.0000 mg | Freq: Four times a day (QID) | INTRAMUSCULAR | Status: DC | PRN

## 2020-05-15 MED ORDER — PROPOFOL 10 MG/ML IV BOLUS
INTRAVENOUS | Status: AC
  Filled 2020-05-15: qty 20

## 2020-05-15 MED ORDER — TRAZODONE HCL 50 MG PO TABS
50.0000 mg | ORAL_TABLET | Freq: Every day | ORAL | Status: DC
  Administered 2020-05-15 – 2020-05-24 (×10): 50 mg via ORAL
  Filled 2020-05-15 (×10): qty 1

## 2020-05-15 MED ORDER — ACETAMINOPHEN 325 MG PO TABS: 650.0000 mg | ORAL_TABLET | ORAL | Status: DC | PRN

## 2020-05-15 MED ORDER — SUCCINYLCHOLINE CHLORIDE 200 MG/10ML IV SOSY
PREFILLED_SYRINGE | INTRAVENOUS | Status: DC | PRN
  Administered 2020-05-15: 160 mg via INTRAVENOUS

## 2020-05-15 MED ORDER — PHENOL 1.4 % MT LIQD: 1.0000 | OROMUCOSAL | Status: DC | PRN

## 2020-05-15 MED ORDER — FLEET ENEMA 7-19 GM/118ML RE ENEM: 1.0000 | ENEMA | Freq: Once | RECTAL | Status: DC | PRN

## 2020-05-15 MED ORDER — OXYCODONE HCL 5 MG PO TABS
5.0000 mg | ORAL_TABLET | ORAL | Status: DC | PRN
  Administered 2020-05-16 – 2020-05-20 (×3): 5 mg via ORAL
  Filled 2020-05-15 (×4): qty 1

## 2020-05-15 MED ORDER — ZOLPIDEM TARTRATE 5 MG PO TABS: 5.0000 mg | ORAL_TABLET | Freq: Every evening | ORAL | Status: DC | PRN

## 2020-05-15 MED ORDER — ONDANSETRON HCL 4 MG/2ML IJ SOLN
INTRAMUSCULAR | Status: DC | PRN
  Administered 2020-05-15: 4 mg via INTRAVENOUS

## 2020-05-15 MED ORDER — BUPIVACAINE LIPOSOME 1.3 % IJ SUSP
INTRAMUSCULAR | Status: AC
  Filled 2020-05-15: qty 20

## 2020-05-15 MED ORDER — THROMBIN 5000 UNITS EX SOLR
OROMUCOSAL | Status: DC | PRN
  Administered 2020-05-15: 5 mL via TOPICAL

## 2020-05-15 MED ORDER — LIDOCAINE-EPINEPHRINE 1 %-1:100000 IJ SOLN
INTRAMUSCULAR | Status: DC | PRN
  Administered 2020-05-15: 3.5 mL via INTRADERMAL
  Administered 2020-05-15: 15 mL via INTRADERMAL

## 2020-05-15 MED ORDER — ALBUTEROL SULFATE (2.5 MG/3ML) 0.083% IN NEBU: 2.5000 mg | INHALATION_SOLUTION | Freq: Four times a day (QID) | RESPIRATORY_TRACT | Status: DC | PRN

## 2020-05-15 MED ORDER — ALPRAZOLAM 0.25 MG PO TABS: 0.2500 mg | ORAL_TABLET | Freq: Every evening | ORAL | Status: DC | PRN

## 2020-05-15 MED ORDER — ONDANSETRON HCL 4 MG/2ML IJ SOLN
INTRAMUSCULAR | Status: AC
  Filled 2020-05-15: qty 2

## 2020-05-15 MED ORDER — HYDROCODONE-ACETAMINOPHEN 5-325 MG PO TABS
2.0000 | ORAL_TABLET | ORAL | Status: DC | PRN
  Administered 2020-05-15 – 2020-05-25 (×32): 2 via ORAL
  Filled 2020-05-15 (×32): qty 2

## 2020-05-15 MED ORDER — BUPROPION HCL ER (XL) 150 MG PO TB24
300.0000 mg | ORAL_TABLET | Freq: Every day | ORAL | Status: DC
  Administered 2020-05-15 – 2020-05-25 (×11): 300 mg via ORAL
  Filled 2020-05-15 (×11): qty 2

## 2020-05-15 MED ORDER — PHENYLEPHRINE 40 MCG/ML (10ML) SYRINGE FOR IV PUSH (FOR BLOOD PRESSURE SUPPORT)
PREFILLED_SYRINGE | INTRAVENOUS | Status: DC | PRN
  Administered 2020-05-15: 12 ug via INTRAVENOUS

## 2020-05-15 MED ORDER — PANTOPRAZOLE SODIUM 40 MG PO TBEC
80.0000 mg | DELAYED_RELEASE_TABLET | Freq: Every day | ORAL | Status: DC
  Administered 2020-05-15 – 2020-05-25 (×11): 80 mg via ORAL
  Filled 2020-05-15 (×11): qty 2

## 2020-05-15 MED ORDER — PROPOFOL 10 MG/ML IV BOLUS
INTRAVENOUS | Status: DC | PRN
  Administered 2020-05-15: 160 mg via INTRAVENOUS
  Administered 2020-05-15: 20 mg via INTRAVENOUS
  Administered 2020-05-15: 40 mg via INTRAVENOUS

## 2020-05-15 MED ORDER — ACETAMINOPHEN 10 MG/ML IV SOLN
INTRAVENOUS | Status: AC
  Filled 2020-05-15: qty 100

## 2020-05-15 MED ORDER — BUPIVACAINE HCL (PF) 0.5 % IJ SOLN
INTRAMUSCULAR | Status: DC | PRN
  Administered 2020-05-15: 3.5 mL
  Administered 2020-05-15: 15 mL

## 2020-05-15 MED ORDER — DEXAMETHASONE SODIUM PHOSPHATE 10 MG/ML IJ SOLN
INTRAMUSCULAR | Status: AC
  Filled 2020-05-15: qty 1

## 2020-05-15 MED ORDER — CEFAZOLIN SODIUM-DEXTROSE 2-4 GM/100ML-% IV SOLN
2.0000 g | Freq: Three times a day (TID) | INTRAVENOUS | Status: AC
  Administered 2020-05-15 – 2020-05-16 (×2): 2 g via INTRAVENOUS
  Filled 2020-05-15 (×2): qty 100

## 2020-05-15 MED ORDER — GABAPENTIN 300 MG PO CAPS: 300.0000 mg | ORAL_CAPSULE | Freq: Three times a day (TID) | ORAL | Status: DC

## 2020-05-15 MED ORDER — LACTATED RINGERS IV SOLN: INTRAVENOUS | Status: DC

## 2020-05-15 MED ORDER — ACETAMINOPHEN 10 MG/ML IV SOLN
INTRAVENOUS | Status: DC | PRN
  Administered 2020-05-15: 1000 mg via INTRAVENOUS

## 2020-05-15 MED ORDER — MUPIROCIN 2 % EX OINT
TOPICAL_OINTMENT | CUTANEOUS | Status: AC
  Administered 2020-05-15: 1 via NASAL
  Filled 2020-05-15: qty 22

## 2020-05-15 MED ORDER — HYDROMORPHONE HCL 1 MG/ML IJ SOLN
0.5000 mg | INTRAMUSCULAR | Status: DC | PRN
  Administered 2020-05-15 – 2020-05-16 (×3): 0.5 mg via INTRAVENOUS
  Filled 2020-05-15 (×3): qty 1

## 2020-05-15 MED ORDER — CEFAZOLIN SODIUM 1 G IJ SOLR
INTRAMUSCULAR | Status: AC
  Filled 2020-05-15: qty 20

## 2020-05-15 MED ORDER — IRBESARTAN 75 MG PO TABS
75.0000 mg | ORAL_TABLET | Freq: Every day | ORAL | Status: DC
  Administered 2020-05-15 – 2020-05-25 (×11): 75 mg via ORAL
  Filled 2020-05-15 (×11): qty 1

## 2020-05-15 MED ORDER — ALUM & MAG HYDROXIDE-SIMETH 200-200-20 MG/5ML PO SUSP
30.0000 mL | Freq: Four times a day (QID) | ORAL | Status: DC | PRN
Start: 2020-05-15 — End: 2020-05-26

## 2020-05-15 MED ORDER — GABAPENTIN 300 MG PO CAPS
600.0000 mg | ORAL_CAPSULE | Freq: Every day | ORAL | Status: DC
  Administered 2020-05-15 – 2020-05-24 (×10): 600 mg via ORAL
  Filled 2020-05-15 (×10): qty 2

## 2020-05-15 SURGICAL SUPPLY — 97 items
ADH SKN CLS APL DERMABOND .7 (GAUZE/BANDAGES/DRESSINGS) ×3
BAND INSRT 18 STRL LF DISP RB (MISCELLANEOUS) ×2
BAND RUBBER #18 3X1/16 STRL (MISCELLANEOUS) ×4 IMPLANT
BASKET BONE COLLECTION (BASKET) ×1 IMPLANT
BLADE BN FN 3.2XSTRL LF (MISCELLANEOUS) IMPLANT
BLADE BONE MILL FINE (MISCELLANEOUS) ×2
BLADE CLIPPER SURG (BLADE) IMPLANT
BUR MATCHSTICK NEURO 3.0 LAGG (BURR) ×2 IMPLANT
BUR ROUND FLUTED 5 RND (BURR) ×2 IMPLANT
CANISTER SUCT 3000ML PPV (MISCELLANEOUS) ×2 IMPLANT
CARTRIDGE OIL MAESTRO DRILL (MISCELLANEOUS) ×1 IMPLANT
CLIP NEUROVISION LG (CLIP) ×1 IMPLANT
CNTNR URN SCR LID CUP LEK RST (MISCELLANEOUS) ×1 IMPLANT
CONT SPEC 4OZ STRL OR WHT (MISCELLANEOUS) ×2
COVER BACK TABLE 60X90IN (DRAPES) ×2 IMPLANT
COVER WAND RF STERILE (DRAPES) ×4 IMPLANT
DECANTER SPIKE VIAL GLASS SM (MISCELLANEOUS) ×4 IMPLANT
DERMABOND ADVANCED (GAUZE/BANDAGES/DRESSINGS) ×3
DERMABOND ADVANCED .7 DNX12 (GAUZE/BANDAGES/DRESSINGS) ×1 IMPLANT
DIFFUSER DRILL AIR PNEUMATIC (MISCELLANEOUS) ×2 IMPLANT
DRAPE C-ARM 42X72 X-RAY (DRAPES) ×2 IMPLANT
DRAPE C-ARMOR (DRAPES) ×2 IMPLANT
DRAPE LAPAROTOMY 100X72X124 (DRAPES) ×4 IMPLANT
DRAPE MICROSCOPE LEICA (MISCELLANEOUS) ×3 IMPLANT
DRAPE SURG 17X23 STRL (DRAPES) ×4 IMPLANT
DRSG OPSITE POSTOP 4X10 (GAUZE/BANDAGES/DRESSINGS) ×1 IMPLANT
DRSG OPSITE POSTOP 4X6 (GAUZE/BANDAGES/DRESSINGS) ×1 IMPLANT
DRSG OPSITE POSTOP 4X8 (GAUZE/BANDAGES/DRESSINGS) ×1 IMPLANT
DURAPREP 26ML APPLICATOR (WOUND CARE) ×4 IMPLANT
ELECT BLADE 4.0 EZ CLEAN MEGAD (MISCELLANEOUS) ×2
ELECT REM PT RETURN 9FT ADLT (ELECTROSURGICAL) ×4
ELECTRODE BLDE 4.0 EZ CLN MEGD (MISCELLANEOUS) IMPLANT
ELECTRODE REM PT RTRN 9FT ADLT (ELECTROSURGICAL) ×2 IMPLANT
GAUZE 4X4 16PLY RFD (DISPOSABLE) IMPLANT
GAUZE SPONGE 4X4 12PLY STRL (GAUZE/BANDAGES/DRESSINGS) ×2 IMPLANT
GAUZE SPONGE 4X4 16PLY XRAY LF (GAUZE/BANDAGES/DRESSINGS) ×2 IMPLANT
GLOVE BIO SURGEON STRL SZ8 (GLOVE) ×6 IMPLANT
GLOVE BIOGEL PI IND STRL 8.5 (GLOVE) ×3 IMPLANT
GLOVE BIOGEL PI INDICATOR 8.5 (GLOVE) ×3
GLOVE ECLIPSE 8.0 STRL XLNG CF (GLOVE) ×4 IMPLANT
GLOVE EXAM NITRILE XL STR (GLOVE) IMPLANT
GLOVE SRG 8 PF TXTR STRL LF DI (GLOVE) ×2 IMPLANT
GLOVE SURG POLYISO LF SZ6.5 (GLOVE) ×2 IMPLANT
GLOVE SURG POLYISO LF SZ7 (GLOVE) ×2 IMPLANT
GLOVE SURG UNDER POLY LF SZ8 (GLOVE) ×4
GOWN STRL REUS W/ TWL LRG LVL3 (GOWN DISPOSABLE) IMPLANT
GOWN STRL REUS W/ TWL XL LVL3 (GOWN DISPOSABLE) ×2 IMPLANT
GOWN STRL REUS W/TWL 2XL LVL3 (GOWN DISPOSABLE) ×2 IMPLANT
GOWN STRL REUS W/TWL LRG LVL3 (GOWN DISPOSABLE) ×2
GOWN STRL REUS W/TWL XL LVL3 (GOWN DISPOSABLE) ×2
GRAFT DURAGEN MATRIX 1WX1L (Tissue) ×1 IMPLANT
GUIDEWIRE NITINOL BEVEL TIP (WIRE) ×9 IMPLANT
HEMOSTAT POWDER KIT SURGIFOAM (HEMOSTASIS) ×2 IMPLANT
KIT BASIN OR (CUSTOM PROCEDURE TRAY) ×4 IMPLANT
KIT POSITION SURG JACKSON T1 (MISCELLANEOUS) ×2 IMPLANT
KIT TURNOVER KIT B (KITS) ×4 IMPLANT
MARKER SKIN DUAL TIP RULER LAB (MISCELLANEOUS) ×2 IMPLANT
MODULE NVM5 NEXT GEN EMG (NEEDLE) ×1 IMPLANT
NDL HYPO 18GX1.5 BLUNT FILL (NEEDLE) IMPLANT
NDL HYPO 21X1.5 SAFETY (NEEDLE) IMPLANT
NDL HYPO 25X1 1.5 SAFETY (NEEDLE) ×2 IMPLANT
NDL I PASS (NEEDLE) IMPLANT
NDL SPNL 18GX3.5 QUINCKE PK (NEEDLE) ×1 IMPLANT
NEEDLE HYPO 18GX1.5 BLUNT FILL (NEEDLE) IMPLANT
NEEDLE HYPO 21X1.5 SAFETY (NEEDLE) ×2 IMPLANT
NEEDLE HYPO 25X1 1.5 SAFETY (NEEDLE) ×2 IMPLANT
NEEDLE I PASS (NEEDLE) ×2 IMPLANT
NEEDLE SPNL 18GX3.5 QUINCKE PK (NEEDLE) ×2 IMPLANT
NS IRRIG 1000ML POUR BTL (IV SOLUTION) ×4 IMPLANT
OIL CARTRIDGE MAESTRO DRILL (MISCELLANEOUS) ×2
PACK LAMINECTOMY NEURO (CUSTOM PROCEDURE TRAY) ×4 IMPLANT
PAD ARMBOARD 7.5X6 YLW CONV (MISCELLANEOUS) ×11 IMPLANT
PATTIES SURGICAL .5 X.5 (GAUZE/BANDAGES/DRESSINGS) IMPLANT
PATTIES SURGICAL .5 X1 (DISPOSABLE) IMPLANT
PATTIES SURGICAL 1X1 (DISPOSABLE) IMPLANT
ROD PRECEPT TI 130MM LORD PB (Rod) ×1 IMPLANT
ROD RELINE MAS LORD 5.5X120MM (Rod) ×1 IMPLANT
SCREW LOCK RELINE 5.5 TULIP (Screw) ×8 IMPLANT
SCREW MAS RELINE 6.5X45 POLY (Screw) ×6 IMPLANT
SCREW MAS RELINE POLY 6.5X40 (Screw) ×2 IMPLANT
SEALANT ADHERUS EXTEND TIP (MISCELLANEOUS) ×1 IMPLANT
SPONGE LAP 4X18 RFD (DISPOSABLE) IMPLANT
SPONGE SURGIFOAM ABS GEL SZ50 (HEMOSTASIS) IMPLANT
STAPLER SKIN PROX WIDE 3.9 (STAPLE) IMPLANT
SUT VIC AB 0 CT1 18XCR BRD8 (SUTURE) ×1 IMPLANT
SUT VIC AB 0 CT1 8-18 (SUTURE) ×2
SUT VIC AB 1 CT1 18XBRD ANBCTR (SUTURE) ×2 IMPLANT
SUT VIC AB 1 CT1 8-18 (SUTURE) ×2
SUT VIC AB 2-0 CT1 18 (SUTURE) ×6 IMPLANT
SUT VIC AB 3-0 SH 8-18 (SUTURE) ×8 IMPLANT
SYR 20CC LL (SYRINGE) ×1 IMPLANT
SYR 5ML LL (SYRINGE) IMPLANT
SYR TB 1ML 25GX5/8 (SYRINGE) IMPLANT
TOWEL GREEN STERILE (TOWEL DISPOSABLE) ×4 IMPLANT
TOWEL GREEN STERILE FF (TOWEL DISPOSABLE) ×4 IMPLANT
TRAY FOLEY MTR SLVR 16FR STAT (SET/KITS/TRAYS/PACK) ×2 IMPLANT
WATER STERILE IRR 1000ML POUR (IV SOLUTION) ×4 IMPLANT

## 2020-05-15 NOTE — Anesthesia Postprocedure Evaluation (Signed)
Anesthesia Post Note  Patient: Heather [name]  Procedure(s) Performed: LUMBAR PERCUTANEOUS SCREW, Lumbar one - Lumbar two, Lumbar four - Lumbar five (N/A Back) LUMBAR LAMINECTOMY/DECOMPRESSION OF Lumbar three (N/A Back)     Patient location during evaluation: PACU Anesthesia Type: General Level of consciousness: awake and alert Pain management: pain level controlled Vital Signs Assessment: post-procedure vital signs reviewed and stable Respiratory status: spontaneous breathing, nonlabored ventilation, respiratory function stable and patient connected to nasal cannula oxygen Cardiovascular status: blood pressure returned to baseline and stable Postop Assessment: no apparent nausea or vomiting Anesthetic complications: no   No complications documented.  Last Vitals:  Vitals:   05/15/20 1455 05/15/20 2000  BP: (!) 191/100 (!) 170/96  Pulse: 99   Resp: 20   Temp: 36.7 C 37.2 C  SpO2: 99%     Last Pain:  Vitals:   05/15/20 2000  TempSrc: Oral  PainSc: 9                  [name]

## 2020-05-15 NOTE — Anesthesia Preprocedure Evaluation (Signed)

## 2020-05-15 NOTE — Anesthesia Procedure Notes (Addendum)
Procedure Name: Intubation Date/Time: 05/15/2020 8:45 AM Performed by: Janace Litten, CRNA Pre-anesthesia Checklist: Patient identified, Emergency Drugs available, Suction available and Patient being monitored Patient Re-evaluated:Patient Re-evaluated prior to induction Oxygen Delivery Method: Circle System Utilized Preoxygenation: Pre-oxygenation with 100% oxygen Induction Type: IV induction Ventilation: Mask ventilation without difficulty Laryngoscope Size: Mac and 3 Grade View: Grade I Tube type: Oral Tube size: 7.0 mm Number of attempts: 1 Airway Equipment and Method: Stylet Placement Confirmation: ETT inserted through vocal cords under direct vision,  positive ETCO2 and breath sounds checked- equal and bilateral Secured at: 21 cm Tube secured with: Tape Dental Injury: Teeth and Oropharynx as per pre-operative assessment

## 2020-05-15 NOTE — Op Note (Signed)
05/15/2020  11:49 AM  PATIENT:  Heather [name]  75 y.o. female  PRE-OPERATIVE DIAGNOSIS:  BURST FRACTURE Lumbar three, RETROPULSION AND SEVERE SPINAL STENOSIS  POST-OPERATIVE DIAGNOSIS:  BURST FRACTURE Lumbar three, RETROPULSION AND SEVERE SPINAL STENOSIS  PROCEDURE:  Procedure(s): LUMBAR PERCUTANEOUS SCREW, Lumbar one - Lumbar two, Lumbar four - Lumbar five (N/A) LUMBAR LAMINECTOMY/DECOMPRESSION OF Lumbar three (N/A)  SURGEON:  Surgeon(s) and Role:    * Savien Mamula, MD - Primary  PHYSICIAN ASSISTANT: McDaniel, NP  ASSISTANTS: None  ANESTHESIA:   general  EBL:  200 mL   BLOOD ADMINISTERED:none  DRAINS: none   LOCAL MEDICATIONS USED:  MARCAINE    and LIDOCAINE   SPECIMEN:  No Specimen  DISPOSITION OF SPECIMEN:  N/A  COUNTS:  YES  TOURNIQUET:  * No tourniquets in log *  DICTATION: Patient is 75-year-old woman with  Who fell from a ladder yesterday and has an L 3 burst fracture with retropulsed bone in the spinal canal and severe leg pain. It was elected to take her to surgery for decompression at L 3 and fusion at L 1  through  L 5  Levels with percutaneous pedicle screws.    Procedure: Patient was placed in a prone position on the [place] table after smooth and uncomplicated induction of general endotracheal anesthesia. Her low back was prepped and draped in usual sterile fashion with betadine scrub and DuraPrep. Stab incisions were made at L 5, L 4, L 2, L 1 bilaterally and Nuvasive Reline percutaneous pedicle screws were placed with nerve monitoring with C arm visualization.  Areas of incision were infiltrated with local lidocaine.  Pedicle screws were placed at L 1 and L 2  (6.5 x 45 left and 6.5 x 40 right) , L 4 (6.5 x 45 bilaterally) , L 5 (6.5 x 45 bilaterally) levels bilaterally using AP and Lateral fluoroscopy.  A 120 mm rod was placed on the left and a 130 mm rod was placed on the right.  Both were locked down in situ.   A total laminectomy of L3 was  performed with disarticulation of the facet joints at this level and thorough decompression was performed of both L3, L4  nerve roots along with the common dural tube. I tamped retropulsed bone fragments away from the thecal sac and nerve roots. There was a large central dural laceration and multiple additional dural defects overlying the fracture.  I was unable to close these as the dura appeared shredded, so I elected to place a Duragen patch and then placed Dural sealant over the dural defect.  This was done after the retropulsed bone fragments had been tamped away from the dura on both sides of the spinal canal.   Incision was infiltrated 20 cc of long-acting Marcaine was used in the posterolateral soft tissue.  Fascia was closed with 1 Vicryl sutures skin edges were reapproximated 2 and 3-0 Vicryl sutures. The wound was dressed with Dermabond and occlusive dressings.   The patient was extubated in the operating room and taken to recovery in stable satisfactory condition he tolerated traction well counts were correct at the end of the case.   PLAN OF CARE: Admit to inpatient   PATIENT DISPOSITION:  PACU - hemodynamically stable.   Delay start of Pharmacological VTE agent (>24hrs) due to surgical blood loss or risk of bleeding: yes  

## 2020-05-15 NOTE — Brief Op Note (Signed)
05/15/2020  11:49 AM  PATIENT:  Heather [name]  75 y.o. female  PRE-OPERATIVE DIAGNOSIS:  BURST FRACTURE Lumbar three, RETROPULSION AND SEVERE SPINAL STENOSIS  POST-OPERATIVE DIAGNOSIS:  BURST FRACTURE Lumbar three, RETROPULSION AND SEVERE SPINAL STENOSIS  PROCEDURE:  Procedure(s): LUMBAR PERCUTANEOUS SCREW, Lumbar one - Lumbar two, Lumbar four - Lumbar five (N/A) LUMBAR LAMINECTOMY/DECOMPRESSION OF Lumbar three (N/A)  SURGEON:  Surgeon(s) and Role:    Maeola Harman, MD - Primary  PHYSICIAN ASSISTANT: Julien Girt, NP  ASSISTANTS: None  ANESTHESIA:   general  EBL:  200 mL   BLOOD ADMINISTERED:none  DRAINS: none   LOCAL MEDICATIONS USED:  MARCAINE    and LIDOCAINE   SPECIMEN:  No Specimen  DISPOSITION OF SPECIMEN:  N/A  COUNTS:  YES  TOURNIQUET:  * No tourniquets in log *  DICTATION: Patient is 75 year old woman with  Who fell from a ladder yesterday and has an L 3 burst fracture with retropulsed bone in the spinal canal and severe leg pain. It was elected to take her to surgery for decompression at L 3 and fusion at L 1  through  L 5  Levels with percutaneous pedicle screws.    Procedure: Patient was placed in a prone position on the [place] table after smooth and uncomplicated induction of general endotracheal anesthesia. Her low back was prepped and draped in usual sterile fashion with betadine scrub and DuraPrep. Stab incisions were made at L 5, L 4, L 2, L 1 bilaterally and Nuvasive Reline percutaneous pedicle screws were placed with nerve monitoring with C arm visualization.  Areas of incision were infiltrated with local lidocaine.  Pedicle screws were placed at L 1 and L 2  (6.5 x 45 left and 6.5 x 40 right) , L 4 (6.5 x 45 bilaterally) , L 5 (6.5 x 45 bilaterally) levels bilaterally using AP and Lateral fluoroscopy.  A 120 mm rod was placed on the left and a 130 mm rod was placed on the right.  Both were locked down in situ.   A total laminectomy of L3 was  performed with disarticulation of the facet joints at this level and thorough decompression was performed of both L3, L4  nerve roots along with the common dural tube. I tamped retropulsed bone fragments away from the thecal sac and nerve roots. There was a large central dural laceration and multiple additional dural defects overlying the fracture.  I was unable to close these as the dura appeared shredded, so I elected to place a Duragen patch and then placed Dural sealant over the dural defect.  This was done after the retropulsed bone fragments had been tamped away from the dura on both sides of the spinal canal.   Incision was infiltrated 20 cc of long-acting Marcaine was used in the posterolateral soft tissue.  Fascia was closed with 1 Vicryl sutures skin edges were reapproximated 2 and 3-0 Vicryl sutures. The wound was dressed with Dermabond and occlusive dressings.   The patient was extubated in the operating room and taken to recovery in stable satisfactory condition he tolerated traction well counts were correct at the end of the case.   PLAN OF CARE: Admit to inpatient   PATIENT DISPOSITION:  PACU - hemodynamically stable.   Delay start of Pharmacological VTE agent (>24hrs) due to surgical blood loss or risk of bleeding: yes

## 2020-05-15 NOTE — Progress Notes (Signed)
Orthopedic Tech Progress Note Patient Details:  Kenyia Wambolt October 21, 1945 [id number] Patient has brace Patient ID: Dennison Mascot, female   DOB: [birth year], 75 y.o.   MRN: [id number]   Michelle Piper 05/15/2020, 4:14 PM

## 2020-05-15 NOTE — Transfer of Care (Signed)
Immediate Anesthesia Transfer of Care Note  Patient: Heather [name]  Procedure(s) Performed: LUMBAR PERCUTANEOUS SCREW, Lumbar one - Lumbar two, Lumbar four - Lumbar five (N/A Back) LUMBAR LAMINECTOMY/DECOMPRESSION OF Lumbar three (N/A Back)  Patient Location: PACU  Anesthesia Type:General  Level of Consciousness: drowsy, patient cooperative and responds to stimulation  Airway & Oxygen Therapy: Patient Spontanous Breathing and Patient connected to nasal cannula oxygen  Post-op Assessment: Report given to RN and Post -op Vital signs reviewed and stable  Post vital signs: Reviewed and stable  Last Vitals:  Vitals Value Taken Time  BP 160/84 05/15/20 1154  Temp    Pulse 103 05/15/20 1158  Resp 23 05/15/20 1158  SpO2 97 % 05/15/20 1158  Vitals shown include unvalidated device data.  Last Pain:  Vitals:   05/15/20 0506  TempSrc:   PainSc: 4       Patients Stated Pain Goal: 2 (05/15/20 0437)  Complications: No complications documented.
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
## 2020-05-15 NOTE — Progress Notes (Signed)
Patient is awake, alert, conversant.  She says her back is hurting less than before her surgery.  She denies numbness in her legs and has full strength in both legs to testing.  She is doing well after her surgery and understands that she needs to lay flat for the next three days to allow her dura to heal.

## 2020-05-16 MED ORDER — HYDROMORPHONE HCL 1 MG/ML IJ SOLN
0.5000 mg | INTRAMUSCULAR | Status: DC | PRN
  Administered 2020-05-16 – 2020-05-19 (×7): 1 mg via INTRAVENOUS
  Filled 2020-05-16 (×7): qty 1

## 2020-05-16 MED ORDER — ENOXAPARIN SODIUM 30 MG/0.3ML ~~LOC~~ SOLN
30.0000 mg | Freq: Two times a day (BID) | SUBCUTANEOUS | Status: DC
  Administered 2020-05-16 – 2020-05-25 (×19): 30 mg via SUBCUTANEOUS
  Filled 2020-05-16 (×19): qty 0.3

## 2020-05-16 NOTE — Progress Notes (Signed)
OT Cancellation Note  Patient Details Name: Heather [name] MRN: [id number] DOB: 1946/01/07   Cancelled Treatment:    Reason Eval/Treat Not Completed: Patient not medically ready;Active bedrest order OT to follow along and attempt again at the end of 72 hours. RN notified that PT order has been d/ced at this time. Recommendation for a time to start evaluation order placed to allow them to assess at the end of the 72 hours.  Wynona Neat, OTR/L  Acute Rehabilitation Services Pager: [phone] Office: [phone] .  05/16/2020, 12:07 PM
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
## 2020-05-17 ENCOUNTER — Encounter (HOSPITAL_COMMUNITY): Payer: Self-pay | Admitting: Neurosurgery

## 2020-05-17 NOTE — Plan of Care (Signed)

## 2020-05-17 NOTE — Progress Notes (Signed)
Subjective: Patient reports that she is doing well. Her pain continues to be well controlled. No acute events overnight.   Objective: Vital signs in last 24 hours: Temp:  [98.1 F (36.7 C)-99.7 F (37.6 C)] 98.6 F (37 C) (03/29 0700) Pulse Rate:  [95-100] 95 (03/29 0700) Resp:  [14-21] 14 (03/29 0700) BP: (73-141)/(53-85) 141/79 (03/29 0700) SpO2:  [93 %-100 %] 97 % (03/29 0700)  Intake/Output from previous day: 03/28 0701 - 03/29 0700 In: 704 [P.O.:704] Out: 551 [Urine:551] Intake/Output this shift: No intake/output data recorded.  Physical Exam: Patient laying comfortably in bed with HOB flat. Patient is awake, A/O X 4, conversant, and in good spirits. Speech is fluent and appropriate. MAEW with good strength that is symmetric bilaterally. 5/5 BUE/BLE. Dressing is clean dry intact. Incision is well approximated with no drainage, erythema, or edema.  Lab Results: Recent Labs    05/14/20 1922 05/15/20 0017  WBC 11.4* 11.1*  HGB 12.9 12.9  HCT 40.0 39.8  PLT 169 151   BMET Recent Labs    05/14/20 1922  NA 139  K 3.7  CL 108  CO2 28  GLUCOSE 99  BUN 16  CREATININE 0.88  CALCIUM 8.7*    Studies/Results: DG Lumbar Spine 2-3 Views  Result Date: 05/15/2020 CLINICAL DATA:  L1 through L5 fusion EXAM: DG C-ARM 1-60 MIN; LUMBAR SPINE - 2-3 VIEW FLUOROSCOPY TIME:  Fluoroscopy Time:  3:43 Radiation Exposure Index (if provided by the fluoroscopic device): 94.21 mGy Number of Acquired Spot Images: 5 COMPARISON:  05/14/2020 FINDINGS: Intraoperative fluoroscopic images of of the lumbar spine demonstrate posterior fusion bridging burst type fracture of the L3 vertebral body. No evidence of perihardware fracture or component malpositioning. IMPRESSION: Intraoperative fluoroscopic images of of the lumbar spine demonstrate posterior fusion bridging burst type fracture of the L3 vertebral body. No evidence of perihardware fracture or component malpositioning. Electronically Signed   By:  Lauralyn Primes M.D.   On: 05/15/2020 13:13   DG C-Arm 1-60 Min  Result Date: 05/15/2020 CLINICAL DATA:  L1 through L5 fusion EXAM: DG C-ARM 1-60 MIN; LUMBAR SPINE - 2-3 VIEW FLUOROSCOPY TIME:  Fluoroscopy Time:  3:43 Radiation Exposure Index (if provided by the fluoroscopic device): 94.21 mGy Number of Acquired Spot Images: 5 COMPARISON:  05/14/2020 FINDINGS: Intraoperative fluoroscopic images of of the lumbar spine demonstrate posterior fusion bridging burst type fracture of the L3 vertebral body. No evidence of perihardware fracture or component malpositioning. IMPRESSION: Intraoperative fluoroscopic images of of the lumbar spine demonstrate posterior fusion bridging burst type fracture of the L3 vertebral body. No evidence of perihardware fracture or component malpositioning. Electronically Signed   By: Lauralyn Primes M.D.   On: 05/15/2020 13:13    Assessment/Plan: Patient is postop day 2 s/p percutaneous screws at L1/2 and L4/5 with L3 laminectomy and decompression for burst fracture at L3 with retropulsion that was causing severe spinal stenosis. She continues to recover well from her surgery and her pain is well controlled. Her only complaint is of mild incisional pain. Continue strict bedrest with HOB flat. Do not place patient in reverse trendelenburg. She must remain flat. May logroll patient. Continue working on pain control. Call with any questions.   LOS: 3 days     Council Mechanic, DNP, NP-C 05/17/2020, 7:48 AM

## 2020-05-17 NOTE — Care Management Important Message (Signed)
Important Message  Patient Details  Name: Heather [name] MRN: [id number] Date of Birth: 08/31/1945   Medicare Important Message Given:  Yes     Dorena Bodo 05/17/2020, 2:34 PM

## 2020-05-18 NOTE — Progress Notes (Signed)
OT Cancellation Note  Patient Details Name: Heather [name] MRN: [id number] DOB: 11-24-1945   Cancelled Treatment:    Reason Eval/Treat Not Completed: Active bedrest order (strict for 72 hours --- please advise if increased today) 05/18/2020  OT order received and appreciated however this conflicts with current bedrest order set. Please increase activity tolerance as appropriate and remove bedrest from orders. . Please contact OT at [phone] if bed rest order is discontinued. OT will hold evaluation at this time and will check back as time allows pending increased activity orders.   Wynona Neat, OTR/L  Acute Rehabilitation Services Pager: [phone] Office: [phone] .  05/18/2020, 8:56 AM

## 2020-05-18 NOTE — Progress Notes (Signed)
Subjective: Patient reports that she is doing well. No acute events overnight.  Objective: Vital signs in last 24 hours: Temp:  [97.3 F (36.3 C)-98.2 F (36.8 C)] 98.1 F (36.7 C) (03/30 0736) Pulse Rate:  [76-89] 84 (03/30 0736) Resp:  [12-19] 17 (03/30 0736) BP: (83-122)/(56-68) 104/65 (03/30 0736) SpO2:  [90 %-99 %] 96 % (03/30 0736)  Intake/Output from previous day: 03/29 0701 - 03/30 0700 In: 500 [P.O.:500] Out: 300 [Urine:300] Intake/Output this shift: No intake/output data recorded.   Physical Exam:Patient continuing to lay flat in bed with HOB flat.Patient is awake, A/O X 4, conversant, and in good spirits. Speech is fluent and appropriate.She is in NAD. MAEW with good strength that is symmetric bilaterally. 5/5 BUE/BLE. Dressing is clean dry intact. Incision is well approximated with no drainage, erythema, or edema.  Lab Results: No results for input(s): WBC, HGB, HCT, PLT in the last 72 hours. BMET No results for input(s): NA, K, CL, CO2, GLUCOSE, BUN, CREATININE, CALCIUM in the last 72 hours.  Studies/Results: No results found.  Assessment/Plan: Patient is postop day 3 s/p percutaneous screws at L1/2 and L4/5 with L3 laminectomy and decompression for burst fracture at L3 with retropulsion that was causing severe spinal stenosis. Begin gradually rasing HOB throughout the morning. If patient remains stable, can ambulate patient this evening. If patient becomes symptomatic, place patient back into bed with HOB flat and notify neurosurgery.    LOS: 4 days     Council Mechanic, DNP, NP-C 05/18/2020, 8:48 AM

## 2020-05-18 NOTE — Plan of Care (Signed)

## 2020-05-18 NOTE — Plan of Care (Signed)
  Problem: Pain Managment: Goal: General experience of comfort will improve Outcome: Progressing   
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
## 2020-05-19 NOTE — Progress Notes (Signed)
Foley catheter removed at 0830 today.  Patient states she has not voided since.  Patient worked with therapy and they were taking her to bathroom to have her try to void, patient states she has not voided but also gets a little confused at times.  Unable to obtain a volume on the bladder scanner, tried multiple times with different users without success.  Patient straight catheterized by this RN at 1700, obtaining of clear slightly dark yellow urine.  Peri care provided.  Will continue to monitor and pass onto oncoming RN.    05/19/20 1700  Output (mL)  Urine 0 mL  Urine Characteristics  Time patient last voided or urinary catheter emptied 0830  Urinary Interventions Bladder scan;Intermittent/Straight cath  Bladder Scan Volume (mL)  (unable to obtain volume on scanner)  Intermittent/Straight Cath (mL) 300 mL  Intermittent Catheter Size 16  Hygiene Peri care

## 2020-05-19 NOTE — Progress Notes (Signed)
Subjective: Patient reports that she is doing well overall. She does have complaints of low back / incisional pain that is well controlled on PO analgesics. She is fearful of ambulating with nursing staff.   Objective: Vital signs in last 24 hours: Temp:  [97.5 F (36.4 C)-98.4 F (36.9 C)] 97.5 F (36.4 C) (03/31 0300) Pulse Rate:  [81-85] 81 (03/31 0300) Resp:  [14-20] 17 (03/31 0300) BP: (104-121)/(54-65) 114/64 (03/31 0300) SpO2:  [96 %-100 %] 100 % (03/31 0411)  Intake/Output from previous day: 03/30 0701 - 03/31 0700 In: 563 [P.O.:560; I.V.:3] Out: 550 [Urine:550] Intake/Output this shift: No intake/output data recorded.  Physical Exam:Patient laying comfortably in bed.Patient is awake, A/O X 4, conversant, and in good spirits. Speech is fluent and appropriate.She is in NAD. MAEW with good strength that is symmetric bilaterally. 5/5 BUE/BLE. Dressing is clean dry intact. Incision is well approximated with no drainage, erythema, or edema.  Lab Results: No results for input(s): WBC, HGB, HCT, PLT in the last 72 hours. BMET No results for input(s): NA, K, CL, CO2, GLUCOSE, BUN, CREATININE, CALCIUM in the last 72 hours.  Studies/Results: No results found.  Assessment/Plan: Patient is postop day4 s/p percutaneous screws at L1/2 and L4/5 with L3 laminectomy and decompression for burst fracture at L3 with retropulsion that was causing severe spinal stenosis. HOB was raised yesterday and patient remained asymptomatic with no signs of CSF leak at her incision site. Remove foley catheter. Plan to mobilize with PT today. Continue encouraging mobility and working on pain continue. LSO brace when OOB.    LOS: 5 days     Council Mechanic, DNP, NP-C 05/19/2020, 7:06 AM

## 2020-05-19 NOTE — Plan of Care (Signed)
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
## 2020-05-21 NOTE — Progress Notes (Signed)
CSW attempted to meet with patient to discuss SNF referrals. CSW noted patient was too somnolent to participate. CSW will check-in later to discuss SNF recommendation and referral process.

## 2020-05-22 NOTE — Progress Notes (Signed)
NEUROSURGERY PROGRESS NOTE  Doing well. Complains of appropriate back soreness. Incision CDI, dressing removed.  Temp:  [97.8 F (36.6 C)-98.6 F (37 C)] 98.4 F (36.9 C) (04/03 1122) Pulse Rate:  [75-82] 77 (04/03 1122) Resp:  [15-20] 20 (04/03 1122) BP: (102-159)/(54-77) 122/54 (04/03 1122) SpO2:  [93 %-100 %] 94 % (04/03 1122)  Plan: OOB and therapy Awaiting SNF placement  Sherryl Manges, NP 05/22/2020 11:29 AM

## 2020-05-22 NOTE — NC FL2 (Signed)
[place] MEDICAID FL2 LEVEL OF CARE SCREENING TOOL     IDENTIFICATION  Patient Name: Heather [name] Birthdate: 1945-05-22 Sex: female Admission Date (Current Location): 05/14/2020  [facility] and [place] Number:  Producer, television/film/video and Address:  [facility]. [facility], [address], [place], [place] [address]      Provider Number: [id number]  Attending Physician Name and Address:  Maeola Harman, MD  Relative Name and Phone Number:  Alonna Minium, daughter, [phone]    Current Level of Care: Hospital Recommended Level of Care: Skilled Nursing Facility Prior Approval Number:    Date Approved/Denied:   PASRR Number: Under review  Discharge Plan: Home    Current Diagnoses: Patient Active Problem List   Diagnosis Date Noted  . Burst fracture of lumbar vertebra (HCC) 05/14/2020  . Chronic cough 12/29/2019  . Essential hypertension 12/29/2019  . Pneumonia due to COVID-19 virus 12/29/2019    Orientation RESPIRATION BLADDER Height & Weight     Self,Time,Situation,Place  Normal Continent Weight: 188 lb 7.9 oz (85.5 kg) Height:  5\' 3"  (160 cm)  BEHAVIORAL SYMPTOMS/MOOD NEUROLOGICAL BOWEL NUTRITION STATUS      Continent    AMBULATORY STATUS COMMUNICATION OF NEEDS Skin   Extensive Assist Verbally Normal                       Personal Care Assistance Level of Assistance  Bathing,Feeding,Dressing Bathing Assistance: Limited assistance Feeding assistance: Limited assistance Dressing Assistance: Limited assistance     Functional Limitations Info             SPECIAL CARE FACTORS FREQUENCY  PT (By licensed PT),OT (By licensed OT)     PT Frequency: 5x weekly OT Frequency: 5x weekly            Contractures Contractures Info: Not present    Additional Factors Info  Code Status,Allergies Code Status Info: Full Allergies Info: Augmentine           Current Medications (05/22/2020):  This is the current hospital active  medication list Current Facility-Administered Medications  Medication Dose Route Frequency Provider Last Rate Last Admin  . 0.9 %  sodium chloride infusion  250 mL Intravenous Continuous 07/22/2020, MD      . acetaminophen (TYLENOL) tablet 650 mg  650 mg Oral Q4H PRN Maeola Harman, MD       Or  . acetaminophen (TYLENOL) suppository 650 mg  650 mg Rectal Q4H PRN Maeola Harman, MD      . albuterol (PROVENTIL) (2.5 MG/3ML) 0.083% nebulizer solution 2.5 mg  2.5 mg Inhalation Q6H PRN Maeola Harman, MD      . alum & mag hydroxide-simeth (MAALOX/MYLANTA) 200-200-20 MG/5ML suspension 30 mL  30 mL Oral Q6H PRN 08-28-2000, MD      . bisacodyl (DULCOLAX) suppository 10 mg  10 mg Rectal Daily PRN Maeola Harman, MD      . buPROPion (WELLBUTRIN XL) 24 hr tablet 300 mg  300 mg Oral Daily Maeola Harman, MD   300 mg at 05/22/20 1026  . celecoxib (CELEBREX) capsule 200 mg  200 mg Oral BID 07/22/20, MD   200 mg at 05/22/20 1026  . Chlorhexidine Gluconate Cloth 2 % PADS 6 each  6 each Topical Daily 07/22/20, MD   6 each at 05/19/20 0930  . dextrose 5 % and 0.45 % NaCl with KCl 20 mEq/L infusion   Intravenous Continuous 05/21/20, MD      .  dicyclomine (BENTYL) tablet 20 mg  20 mg Oral Q6H PRN Maeola Harman, MD   20 mg at 05/21/20 0824  . docusate sodium (COLACE) capsule 100 mg  100 mg Oral BID Maeola Harman, MD   100 mg at 05/22/20 1027  . enoxaparin (LOVENOX) injection 30 mg  30 mg Subcutaneous Q12H Benita Gutter L, NP   30 mg at 05/22/20 1027  . escitalopram (LEXAPRO) tablet 20 mg  20 mg Oral Daily Maeola Harman, MD   20 mg at 05/22/20 1027  . fluticasone (FLONASE) 50 MCG/ACT nasal spray 2 spray  2 spray Each Nare Daily Maeola Harman, MD   2 spray at 05/22/20 1027  . gabapentin (NEURONTIN) capsule 300 mg  300 mg Oral Daily Costella, Vincent J, PA-C   300 mg at 05/22/20 1027  . gabapentin (NEURONTIN) capsule 600 mg  600 mg Oral QHS Costella, Darci Current, PA-C   600 mg at 05/21/20 2110  .  HYDROcodone-acetaminophen (NORCO/VICODIN) 5-325 MG per tablet 2 tablet  2 tablet Oral Q4H PRN Maeola Harman, MD   2 tablet at 05/22/20 1026  . HYDROmorphone (DILAUDID) injection 0.5-1 mg  0.5-1 mg Intravenous Q2H PRN Council Mechanic, NP   1 mg at 05/19/20 1059  . irbesartan (AVAPRO) tablet 75 mg  75 mg Oral Daily Maeola Harman, MD   75 mg at 05/22/20 1026  . menthol-cetylpyridinium (CEPACOL) lozenge 3 mg  1 lozenge Oral PRN Maeola Harman, MD       Or  . phenol [facility]) mouth spray 1 spray  1 spray Mouth/Throat PRN Maeola Harman, MD      . methocarbamol (ROBAXIN) tablet 500 mg  500 mg Oral Q6H PRN Maeola Harman, MD   500 mg at 05/22/20 1027   Or  . methocarbamol (ROBAXIN) 500 mg in dextrose 5 % 50 mL IVPB  500 mg Intravenous Q6H PRN Maeola Harman, MD      . montelukast (SINGULAIR) tablet 10 mg  10 mg Oral QHS Maeola Harman, MD   10 mg at 05/21/20 2111  . ondansetron (ZOFRAN) tablet 4 mg  4 mg Oral Q6H PRN Maeola Harman, MD       Or  . ondansetron [facility]) injection 4 mg  4 mg Intravenous Q6H PRN Maeola Harman, MD   4 mg at 05/21/20 1558  . oxyCODONE (Oxy IR/ROXICODONE) immediate release tablet 5 mg  5 mg Oral Q3H PRN Maeola Harman, MD   5 mg at 05/20/20 3295  . pantoprazole (PROTONIX) EC tablet 80 mg  80 mg Oral Daily Maeola Harman, MD   80 mg at 05/22/20 1026  . polyethylene glycol (MIRALAX / GLYCOLAX) packet 17 g  17 g Oral Daily PRN Maeola Harman, MD      . rosuvastatin (CRESTOR) tablet 10 mg  10 mg Oral Daily Maeola Harman, MD   10 mg at 05/22/20 1027  . senna (SENOKOT) tablet 8.6 mg  1 tablet Oral BID Maeola Harman, MD   8.6 mg at 05/22/20 1027  . sodium chloride flush (NS) 0.9 % injection 3 mL  3 mL Intravenous Q12H Maeola Harman, MD   3 mL at 05/22/20 1029  . sodium chloride flush (NS) 0.9 % injection 3 mL  3 mL Intravenous PRN Maeola Harman, MD      . sodium phosphate (FLEET) 7-19 GM/118ML enema 1 enema  1 enema Rectal Once PRN Maeola Harman, MD      . traZODone (DESYREL) tablet  50 mg  50 mg Oral QHS Costella, Darci Current,  PA-C   50 mg at 05/21/20 2111  . zolpidem (AMBIEN) tablet 5 mg  5 mg Oral QHS PRN Maeola Harman, MD         Discharge Medications: Please see discharge summary for a list of discharge medications.  Relevant Imaging Results:  Relevant Lab Results:   Additional Information SSN: [id number]  Annalee Genta, LCSW

## 2020-05-22 NOTE — TOC Initial Note (Signed)
Transition of Care [facility]) - Initial/Assessment Note    Patient Details  Name: Heather [name] MRN: [id number] Date of Birth: 1945-11-20  Transition of Care [facility]) CM/SW Contact:    Oretha Milch, LCSW Phone Number: 05/22/2020, 12:21 PM  Clinical Narrative: CSW met with patient to discuss recommendation for SNF. CSW introduced self and role to patient and provided education on SNF and goals of treatment. Patient reported she would be open to the inpatient rehab as long as she would not be there forever and CSW discussed typically up to 30 days. Patient attempted to call daughter to be involved but reported daughter works third shift and often sleeps later in the AM to compensate for her work schedule. Patient provided verbal consent and openness to SNF referrals and expressed preference to go to a facility in [place] nearer to her home and family. CSW will begin the SNF process and will follow-up with patient and daughter when bed offers are made.                  Expected Discharge Plan: Skilled Nursing Facility Barriers to Discharge: SNF Pending bed offer   Patient Goals and CMS Choice Patient states their goals for this hospitalization and ongoing recovery are:: "I just don't want to go there forevor. I want to be able to go back home and live my life." CMS Medicare.gov Compare Post Acute Care list provided to:: Patient Choice offered to / list presented to : Patient  Expected Discharge Plan and Services Expected Discharge Plan: [place]     Post Acute Care Choice: [place]                                        Prior Living Arrangements/Services   Lives with:: Self Patient language and need for interpreter reviewed:: Yes Do you feel safe going back to the place where you live?: Yes      Need for Family Participation in Patient Care: No (Comment) Care giver support system in place?: No (comment) Current home services:  DME Criminal Activity/Legal Involvement Pertinent to Current Situation/Hospitalization: No - Comment as needed  Activities of Daily Living Home Assistive Devices/Equipment: None ADL Screening (condition at time of admission) Patient's cognitive ability adequate to safely complete daily activities?: Yes Is the patient deaf or have difficulty hearing?: No Does the patient have difficulty seeing, even when wearing glasses/contacts?: No Does the patient have difficulty concentrating, remembering, or making decisions?: No Patient able to express need for assistance with ADLs?: No Does the patient have difficulty dressing or bathing?: No Independently performs ADLs?: Yes (appropriate for developmental age) Does the patient have difficulty walking or climbing stairs?: No Weakness of Legs: None Weakness of Arms/Hands: None  Permission Sought/Granted Permission sought to share information with : Facility Contact Representative,Family Supports Permission granted to share information with : Yes, Verbal Permission Granted  Share Information with NAME: For SNF referrals and daughter for care coordination           Emotional Assessment Appearance:: Appears stated age Attitude/Demeanor/Rapport: Charismatic Affect (typically observed): Accepting,Adaptable Orientation: : Oriented to Self,Oriented to Place,Oriented to  Time,Oriented to Situation   Psych Involvement: No (comment)  Admission diagnosis:  Burst fracture of lumbar vertebra ([place]) [S32.001A] Strain of lumbar region, initial encounter [S39.012A] Acute left-sided low back pain with left-sided sciatica [M54.42] Patient Active Problem List   Diagnosis Date  Noted  . Burst fracture of lumbar vertebra ([place]) 05/14/2020  . Chronic cough 12/29/2019  . Essential hypertension 12/29/2019  . Pneumonia due to COVID-19 virus 12/29/2019   PCP:  Joneen Boers, MD Pharmacy:   [facility] [address] [place] [address] Phone: [phone] Fax: [phone] [facility], [place] [address] [place] [address] Phone: [phone] Fax: [phone]     Social Determinants of Health (SDOH) Interventions    Readmission Risk Interventions No flowsheet data found.

## 2020-05-23 NOTE — Progress Notes (Signed)
Subjective: Patient reports some mild muscle spasms in her right buttock and right thigh. She has no other complaints and is pleased with her progress.   Objective: Vital signs in last 24 hours: Temp:  [98 F (36.7 C)-98.4 F (36.9 C)] 98.3 F (36.8 C) (04/04 0753) Pulse Rate:  [76-80] 76 (04/04 0753) Resp:  [18-20] 20 (04/04 0753) BP: (120-133)/(54-114) 128/80 (04/04 0753) SpO2:  [94 %-100 %] 98 % (04/04 0753)  Intake/Output from previous day: 04/03 0701 - 04/04 0700 In: 980 [P.O.:980] Out: 750 [Urine:750] Intake/Output this shift: No intake/output data recorded.  Physical Exam:Patientsitting comfortably in her bedside chair with LSO brace on.Patient is awake, A/O X 4, conversant, and in good spirits. Speech is fluent and appropriate.She is in NAD.MAEW with good strength that is symmetric bilaterally. 5/5 BUE/BLE. Dressing is clean dry intact. Incision is well approximated with no drainage, erythema, or edema.  Lab Results: No results for input(s): WBC, HGB, HCT, PLT in the last 72 hours. BMET No results for input(s): NA, K, CL, CO2, GLUCOSE, BUN, CREATININE, CALCIUM in the last 72 hours.  Studies/Results: No results found.  Assessment/Plan: Patient is postop day 8 s/p percutaneous screws at L1/2 and L4/5 with L3 laminectomy and decompression for burst fracture at L3 with retropulsion that was causing severe spinal stenosis.Continue to mobilize with PT. Continue encouraging mobility and working on pain continue. LSO brace when OOB. Plan for CIR.   LOS: 9 days     Council Mechanic, DNP, NP-C 05/23/2020, 8:51 AM
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
## 2020-05-23 NOTE — Social Work (Signed)
                                                                                                                          Re: Heather [name] Date of Birth: September 15, 1945 Date: 05/23/2020  To Whom It May Concern:  Please be advised that the above-named patient will require a short-term nursing home stay-anticipated 30 days or less for rehabilitation and strengthening. The plan is to return home.
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
## 2020-05-24 MED ORDER — HYDROCODONE-ACETAMINOPHEN 5-325 MG PO TABS: 1.0000 | ORAL_TABLET | ORAL | 0 refills | Status: DC | PRN
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
## 2020-05-24 NOTE — Progress Notes (Addendum)
Subjective: Patient reports "I'm doing good!"  Objective: Vital signs in last 24 hours: Temp:  [98.3 F (36.8 C)-98.8 F (37.1 C)] 98.8 F (37.1 C) (04/05 0017) Pulse Rate:  [72-79] 77 (04/05 0017) Resp:  [15-20] 15 (04/05 0017) BP: (96-134)/(58-82) 96/75 (04/05 0017) SpO2:  [96 %-98 %] 98 % (04/05 0017)  Intake/Output from previous day: 04/04 0701 - 04/05 0700 In: 840 [P.O.:840] Out: 250 [Urine:250] Intake/Output this shift: No intake/output data recorded.  Awakens to voioce, smiling and conversant. Reports mild right hip pain when ambulating, which quickly subsides with sitting or lying down, She denies back/leg pain. Lumbar incisions well approximated without erythema swelling or drainage. Good strength BLE.   Lab Results: No results for input(s): WBC, HGB, HCT, PLT in the last 72 hours. BMET No results for input(s): NA, K, CL, CO2, GLUCOSE, BUN, CREATININE, CALCIUM in the last 72 hours.  Studies/Results: No results found.  Assessment/Plan: improving  LOS: 10 days  Planning for SNF for rehab. Pt is agreeable with plan.    Georgiann Cocker 05/24/2020, 7:40 AM   Patient is doing well.  Ready for transfer.
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
## 2020-05-24 NOTE — TOC Progression Note (Addendum)
Transition of Care [facility]) - Progression Note    Patient Details  Name: Heather [name] MRN: [id number] Date of Birth: [birth year]  [facility] of [facility]) CM/SW Contact  Eduard Roux, [place] Phone Number: 05/24/2020, 2:29 PM  Clinical Narrative:      30 day note in Epic must be signed by MD- or 30 note that is placed on the chart.  PSARR number pending- CSW will send clinicals once information when completed  Patient must have signed script for narcotics.   Insurance auth # [id number]- from  4/5-4/7  SNF informed of anticipated d/c tomorrow-covid test completed  RN updated   CSW will continue to follow and assist with d/c planning.  Antony Blackbird, MSW, LCSW Clinical Social Worker    Expected Discharge Plan: Skilled Nursing Facility Barriers to Discharge: Awaiting State Approval (PASRR),Insurance Authorization,Continued Medical Work up,SNF Pending bed offer (patient involved in [facility])  Expected Discharge Plan and Services Expected Discharge Plan: Skilled Nursing Facility In-house Referral: Clinical Social Work   Post Acute Care Choice: Skilled Nursing Facility Living arrangements for the past 2 months: Single Family Home Expected Discharge Date: 05/24/20                                     Social Determinants of Health (SDOH) Interventions    Readmission Risk Interventions No flowsheet data found.

## 2020-05-24 NOTE — TOC Progression Note (Signed)
Transition of Care [facility]) - Progression Note    Patient Details  Name: Heather [name] MRN: [id number] Date of Birth: 1945/08/14  Transition of Care [facility]) CM/SW Contact  Eduard Roux, [place] Phone Number: 05/24/2020, 10:41 AM  Clinical Narrative:     CSW - called [facility]- no answer,waiting on response to confirm bed offer.   CSW will continue to follow and assist with discharge planning.  Antony Blackbird, MSW, LCSW Clinical Social Worker   Expected Discharge Plan: Skilled Nursing Facility Barriers to Discharge: Awaiting State Approval (PASRR),Insurance Authorization,Continued Medical Work up,SNF Pending bed offer (patient involved in [facility])  Expected Discharge Plan and Services Expected Discharge Plan: Skilled Nursing Facility In-house Referral: Clinical Social Work   Post Acute Care Choice: Skilled Nursing Facility Living arrangements for the past 2 months: Single Family Home                                       Social Determinants of Health (SDOH) Interventions    Readmission Risk Interventions No flowsheet data found.

## 2020-05-24 NOTE — Care Management Important Message (Signed)
Important Message  Patient Details  Name: Heather [name] MRN: [id number] Date of Birth: 19-Feb-1946   Medicare Important Message Given:  Yes     [name] Stefan Church 05/24/2020, 1:42 PM

## 2020-05-25 LAB — RESP PANEL BY RT-PCR (FLU A&B, COVID) ARPGX2
Influenza A by PCR: NEGATIVE
Influenza B by PCR: NEGATIVE
SARS Coronavirus 2 by RT PCR: NEGATIVE
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
## 2020-05-25 NOTE — TOC Transition Note (Signed)
Transition of Care [facility]) - CM/SW Discharge Note   Patient Details  Name: Heather [name] MRN: [id number] Date of Birth: 29-Jan-1946  Transition of [facility]) CM/SW Contact:  Eduard Roux, LCSW Phone Number: 05/25/2020, 12:50 PM   Clinical Narrative:     Patient will Discharge to: [facility]  Discharge Date: 05/25/2020 Family Notified: Patricia,daughter Transport By: Sharin Mons  Per MD patient is ready for discharge. RN, patient, and facility notified of discharge. Discharge Summary sent to facility. RN given number for [phone]. Ambulance transport requested for patient.   Clinical Social Worker signing off.  Antony Blackbird, MSW, LCSW Clinical Social Worker    Final next level of care: Skilled Nursing Facility Barriers to Discharge: Barriers Resolved   Patient Goals and CMS Choice Patient states their goals for this hospitalization and ongoing recovery are:: "I just don't want to go there forevor. I want to be able to go back home and live my life." CMS Medicare.gov Compare Post Acute Care list provided to:: Patient Choice offered to / list presented to : Patient  Discharge Placement PASRR number recieved: 05/25/20            Patient [facility] bed at: [facility] Patient to be transferred to facility by: [facility] Name of family member notified: Patricia,daughter Patient and family notified of of transfer: 05/25/20  Discharge Plan and Services In-house Referral: Clinical Social Work   Post Acute Care Choice: Skilled Nursing Facility                               Social Determinants of Health (SDOH) Interventions     Readmission Risk Interventions No flowsheet data found.

## 2020-05-25 NOTE — Progress Notes (Addendum)
RN attempted to call report to receiving facility [facility].   1430: Report called to [facility] RN.

## 2020-05-25 NOTE — Progress Notes (Addendum)
Subjective: Patient reports "I'm doing good. I don't hurt"  Objective: Vital signs in last 24 hours: Temp:  [97.5 F (36.4 C)-98.9 F (37.2 C)] 98.2 F (36.8 C) (04/06 0351) Pulse Rate:  [73-86] 80 (04/06 0351) Resp:  [15-20] 18 (04/06 0351) BP: (129-155)/(66-94) 135/66 (04/06 0351) SpO2:  [96 %-99 %] 97 % (04/06 0351)  Intake/Output from previous day: 04/05 0701 - 04/06 0700 In: 600 [P.O.:600] Out: -  Intake/Output this shift: No intake/output data recorded.  Alert, conversant, reporting no pain at present. Right hip pain with ambulation has decreased since yesterday. Incisions healing nicely. Good strength BLE.  Lab Results: No results for input(s): WBC, HGB, HCT, PLT in the last 72 hours. BMET No results for input(s): NA, K, CL, CO2, GLUCOSE, BUN, CREATININE, CALCIUM in the last 72 hours.  Studies/Results: No results found.  Assessment/Plan: improving  LOS: 11 days  Discharge to SNF today. Rx for [employer] and signed. 30 day letter signed. Summary from yesterday will be revised.    Georgiann Cocker 05/25/2020, 7:41 AM   Patient is doing well.  Discharge to SNF.

## 2020-06-30 ENCOUNTER — Ambulatory Visit: Payer: Medicare HMO | Admitting: Internal Medicine

## 2020-06-30 NOTE — Progress Notes (Deleted)
Heather [name], female    DOB: 08-09-1945,   MRN: [id number]   Brief patient profile:  11 yobf quit smoking around 2007 with pattern of sinus infections at least several times a year never seen specialist goes away with abx pattern in 1980s then cough since admit with COVID 19 in November 2020  And rx with pred/abx only a smidge better.    History of Present Illness  12/29/2019  Pulmonary/ 1st office eval/Heather [name]  On lisinopril as recently as 12/17/19  Chief Complaint  Patient presents with  . Consult    pt is here chronic cough, and fatigue, and sob on exertion  Dyspnea:  Holds on cart due to balance  Cough:  [place]ry [employer] assoc with hoarseness  Sleep:  Worse at hs sleeps flat / two pillows  SABA use: albuterol  Not helping rec Stop lisinopril  Start diovan (valsartan) 80 mg one daily  Change omeprazole to 40 mg Take 30- 60 min before your first and last meals of the day  GERD diet   06/30/2020  f/u ov/Heather [name] re: chronic cough since admit with COVID 19 in November 2020 No chief complaint on file.   Dyspnea:  *** Cough: *** Sleeping: *** SABA use: *** 02: *** Covid status:   ***   No obvious day to day or daytime variability or assoc excess/ purulent sputum or mucus plugs or hemoptysis or cp or chest tightness, subjective wheeze or overt sinus or hb symptoms.   *** without nocturnal  or early am exacerbation  of respiratory  c/o's or need for noct saba. Also denies any obvious fluctuation of symptoms with weather or environmental changes or other aggravating or alleviating factors except as outlined above   No unusual exposure hx or h/o childhood pna/ asthma or knowledge of premature birth.  Current Allergies, Complete Past Medical History, Past Surgical History, Family History, and Social History were reviewed in [employer] record.  ROS  The following are not active complaints unless bolded Hoarseness, sore throat, dysphagia, dental problems,  itching, sneezing,  nasal congestion or discharge of excess mucus or purulent secretions, ear ache,   fever, chills, sweats, unintended wt loss or wt gain, classically pleuritic or exertional cp,  orthopnea pnd or arm/hand swelling  or leg swelling, presyncope, palpitations, abdominal pain, anorexia, nausea, vomiting, diarrhea  or change in bowel habits or change in bladder habits, change in stools or change in urine, dysuria, hematuria,  rash, arthralgias, visual complaints, headache, numbness, weakness or ataxia or problems with walking or coordination,  change in mood or  memory.        No outpatient medications have been marked as taking for the 06/30/20 encounter (Appointment) with Heather Cowden, MD.                 Past Medical History:  Diagnosis Date  . Anxiety   . Closed right ankle fracture   . Complication of anesthesia   . Depression   . Headache   . Hyperlipidemia   . Hypertension   . IBS (irritable bowel syndrome)   . PONV (postoperative nausea and vomiting)   . Seasonal allergies          Objective:     Wt Readings from Last 3 Encounters:  05/14/20 188 lb 7.9 oz (85.5 kg)  12/29/19 188 lb 6.4 oz (85.5 kg)  06/05/18 164 lb 14.5 oz (74.8 kg)      Vital signs reviewed  06/30/2020  - Note  at rest 02 sats  ***% on ***   General appearance:    ***         Assessment

## 2020-10-09 ENCOUNTER — Inpatient Hospital Stay (HOSPITAL_COMMUNITY)
Admission: EM | Admit: 2020-10-09 | Discharge: 2020-10-18 | DRG: 853 | Disposition: A | Discharge status: Rehabilitation Facility | Payer: Medicare HMO | Attending: Family Medicine | Admitting: Family Medicine

## 2020-10-09 ENCOUNTER — Emergency Department (HOSPITAL_COMMUNITY): Payer: Medicare HMO

## 2020-10-09 ENCOUNTER — Other Ambulatory Visit: Payer: Self-pay

## 2020-10-09 ENCOUNTER — Encounter (HOSPITAL_COMMUNITY): Payer: Self-pay

## 2020-10-09 DIAGNOSIS — I9589 Other hypotension: Secondary | ICD-10-CM | POA: Diagnosis not present

## 2020-10-09 DIAGNOSIS — R778 Other specified abnormalities of plasma proteins: Secondary | ICD-10-CM | POA: Diagnosis present

## 2020-10-09 DIAGNOSIS — I959 Hypotension, unspecified: Secondary | ICD-10-CM | POA: Diagnosis present

## 2020-10-09 DIAGNOSIS — E785 Hyperlipidemia, unspecified: Secondary | ICD-10-CM | POA: Diagnosis present

## 2020-10-09 DIAGNOSIS — Z87891 Personal history of nicotine dependence: Secondary | ICD-10-CM

## 2020-10-09 DIAGNOSIS — A419 Sepsis, unspecified organism: Secondary | ICD-10-CM | POA: Diagnosis not present

## 2020-10-09 DIAGNOSIS — W19XXXA Unspecified fall, initial encounter: Secondary | ICD-10-CM | POA: Diagnosis not present

## 2020-10-09 DIAGNOSIS — J45909 Unspecified asthma, uncomplicated: Secondary | ICD-10-CM | POA: Diagnosis present

## 2020-10-09 DIAGNOSIS — F32A Depression, unspecified: Secondary | ICD-10-CM | POA: Diagnosis present

## 2020-10-09 DIAGNOSIS — E875 Hyperkalemia: Secondary | ICD-10-CM | POA: Diagnosis not present

## 2020-10-09 DIAGNOSIS — S7290XA Unspecified fracture of unspecified femur, initial encounter for closed fracture: Secondary | ICD-10-CM | POA: Diagnosis present

## 2020-10-09 DIAGNOSIS — S72422D Displaced fracture of lateral condyle of left femur, subsequent encounter for closed fracture with routine healing: Secondary | ICD-10-CM | POA: Diagnosis not present

## 2020-10-09 DIAGNOSIS — S72402A Unspecified fracture of lower end of left femur, initial encounter for closed fracture: Secondary | ICD-10-CM

## 2020-10-09 DIAGNOSIS — N179 Acute kidney failure, unspecified: Secondary | ICD-10-CM | POA: Diagnosis not present

## 2020-10-09 DIAGNOSIS — E8809 Other disorders of plasma-protein metabolism, not elsewhere classified: Secondary | ICD-10-CM | POA: Diagnosis not present

## 2020-10-09 DIAGNOSIS — I214 Non-ST elevation (NSTEMI) myocardial infarction: Secondary | ICD-10-CM | POA: Diagnosis present

## 2020-10-09 DIAGNOSIS — Y92010 Kitchen of single-family (private) house as the place of occurrence of the external cause: Secondary | ICD-10-CM

## 2020-10-09 DIAGNOSIS — I471 Supraventricular tachycardia: Secondary | ICD-10-CM | POA: Diagnosis present

## 2020-10-09 DIAGNOSIS — Z419 Encounter for procedure for purposes other than remedying health state, unspecified: Secondary | ICD-10-CM

## 2020-10-09 DIAGNOSIS — D72825 Bandemia: Secondary | ICD-10-CM

## 2020-10-09 DIAGNOSIS — W010XXA Fall on same level from slipping, tripping and stumbling without subsequent striking against object, initial encounter: Secondary | ICD-10-CM | POA: Diagnosis present

## 2020-10-09 DIAGNOSIS — R9431 Abnormal electrocardiogram [ECG] [EKG]: Secondary | ICD-10-CM | POA: Diagnosis present

## 2020-10-09 DIAGNOSIS — T148XXA Other injury of unspecified body region, initial encounter: Secondary | ICD-10-CM

## 2020-10-09 DIAGNOSIS — E860 Dehydration: Secondary | ICD-10-CM | POA: Diagnosis present

## 2020-10-09 DIAGNOSIS — R1011 Right upper quadrant pain: Secondary | ICD-10-CM

## 2020-10-09 DIAGNOSIS — Z881 Allergy status to other antibiotic agents status: Secondary | ICD-10-CM

## 2020-10-09 DIAGNOSIS — E43 Unspecified severe protein-calorie malnutrition: Secondary | ICD-10-CM | POA: Diagnosis present

## 2020-10-09 DIAGNOSIS — I5181 Takotsubo syndrome: Secondary | ICD-10-CM | POA: Diagnosis present

## 2020-10-09 DIAGNOSIS — I10 Essential (primary) hypertension: Secondary | ICD-10-CM | POA: Diagnosis present

## 2020-10-09 DIAGNOSIS — R262 Difficulty in walking, not elsewhere classified: Secondary | ICD-10-CM | POA: Diagnosis not present

## 2020-10-09 DIAGNOSIS — M9712XA Periprosthetic fracture around internal prosthetic left knee joint, initial encounter: Secondary | ICD-10-CM | POA: Diagnosis present

## 2020-10-09 DIAGNOSIS — R109 Unspecified abdominal pain: Secondary | ICD-10-CM

## 2020-10-09 DIAGNOSIS — S72432D Displaced fracture of medial condyle of left femur, subsequent encounter for closed fracture with routine healing: Secondary | ICD-10-CM | POA: Diagnosis not present

## 2020-10-09 DIAGNOSIS — Z96653 Presence of artificial knee joint, bilateral: Secondary | ICD-10-CM | POA: Diagnosis present

## 2020-10-09 DIAGNOSIS — E861 Hypovolemia: Secondary | ICD-10-CM | POA: Diagnosis present

## 2020-10-09 DIAGNOSIS — I25110 Atherosclerotic heart disease of native coronary artery with unstable angina pectoris: Secondary | ICD-10-CM | POA: Diagnosis present

## 2020-10-09 DIAGNOSIS — Z8616 Personal history of COVID-19: Secondary | ICD-10-CM | POA: Diagnosis not present

## 2020-10-09 DIAGNOSIS — D696 Thrombocytopenia, unspecified: Secondary | ICD-10-CM | POA: Diagnosis not present

## 2020-10-09 DIAGNOSIS — R5381 Other malaise: Secondary | ICD-10-CM | POA: Diagnosis not present

## 2020-10-09 DIAGNOSIS — G4733 Obstructive sleep apnea (adult) (pediatric): Secondary | ICD-10-CM | POA: Diagnosis present

## 2020-10-09 DIAGNOSIS — Z20822 Contact with and (suspected) exposure to covid-19: Secondary | ICD-10-CM | POA: Diagnosis not present

## 2020-10-09 DIAGNOSIS — K8000 Calculus of gallbladder with acute cholecystitis without obstruction: Secondary | ICD-10-CM | POA: Diagnosis not present

## 2020-10-09 DIAGNOSIS — S72352A Displaced comminuted fracture of shaft of left femur, initial encounter for closed fracture: Secondary | ICD-10-CM | POA: Diagnosis not present

## 2020-10-09 DIAGNOSIS — M341 CR(E)ST syndrome: Secondary | ICD-10-CM | POA: Diagnosis not present

## 2020-10-09 DIAGNOSIS — Z79899 Other long term (current) drug therapy: Secondary | ICD-10-CM

## 2020-10-09 DIAGNOSIS — E440 Moderate protein-calorie malnutrition: Secondary | ICD-10-CM | POA: Insufficient documentation

## 2020-10-09 DIAGNOSIS — I2510 Atherosclerotic heart disease of native coronary artery without angina pectoris: Secondary | ICD-10-CM | POA: Diagnosis not present

## 2020-10-09 DIAGNOSIS — R7989 Other specified abnormal findings of blood chemistry: Secondary | ICD-10-CM | POA: Diagnosis present

## 2020-10-09 DIAGNOSIS — D72829 Elevated white blood cell count, unspecified: Secondary | ICD-10-CM | POA: Diagnosis present

## 2020-10-09 DIAGNOSIS — F5104 Psychophysiologic insomnia: Secondary | ICD-10-CM | POA: Diagnosis present

## 2020-10-09 DIAGNOSIS — Z6820 Body mass index (BMI) 20.0-20.9, adult: Secondary | ICD-10-CM

## 2020-10-09 DIAGNOSIS — I519 Heart disease, unspecified: Secondary | ICD-10-CM | POA: Diagnosis not present

## 2020-10-09 LAB — CBC WITH DIFFERENTIAL/PLATELET
Abs Immature Granulocytes: 0.09 10*3/uL — ABNORMAL HIGH (ref 0.00–0.07)
Basophils Absolute: 0 10*3/uL (ref 0.0–0.1)
Basophils Relative: 0 %
Eosinophils Absolute: 0.1 10*3/uL (ref 0.0–0.5)
Eosinophils Relative: 0 %
HCT: 42.4 % (ref 36.0–46.0)
Hemoglobin: 13.1 g/dL (ref 12.0–15.0)
Immature Granulocytes: 1 %
Lymphocytes Relative: 17 %
Lymphs Abs: 3.2 10*3/uL (ref 0.7–4.0)
MCH: 27 pg (ref 26.0–34.0)
MCHC: 30.9 g/dL (ref 30.0–36.0)
MCV: 87.2 fL (ref 80.0–100.0)
Monocytes Absolute: 1.6 10*3/uL — ABNORMAL HIGH (ref 0.1–1.0)
Monocytes Relative: 8 %
Neutro Abs: 13.7 10*3/uL — ABNORMAL HIGH (ref 1.7–7.7)
Neutrophils Relative %: 74 %
Platelets: 149 10*3/uL — ABNORMAL LOW (ref 150–400)
RBC: 4.86 MIL/uL (ref 3.87–5.11)
RDW: 18.1 % — ABNORMAL HIGH (ref 11.5–15.5)
WBC: 18.6 10*3/uL — ABNORMAL HIGH (ref 4.0–10.5)
nRBC: 0 % (ref 0.0–0.2)

## 2020-10-09 LAB — PROCALCITONIN: Procalcitonin: 0.14 ng/mL

## 2020-10-09 LAB — COMPREHENSIVE METABOLIC PANEL
ALT: 13 U/L (ref 0–44)
AST: 30 U/L (ref 15–41)
Albumin: 3 g/dL — ABNORMAL LOW (ref 3.5–5.0)
Alkaline Phosphatase: 54 U/L (ref 38–126)
Anion gap: 8 (ref 5–15)
BUN: 30 mg/dL — ABNORMAL HIGH (ref 8–23)
CO2: 18 mmol/L — ABNORMAL LOW (ref 22–32)
Calcium: 6.6 mg/dL — ABNORMAL LOW (ref 8.9–10.3)
Chloride: 111 mmol/L (ref 98–111)
Creatinine, Ser: 2.21 mg/dL — ABNORMAL HIGH (ref 0.44–1.00)
GFR, Estimated: 23 mL/min — ABNORMAL LOW (ref >60)
Glucose, Bld: 105 mg/dL — ABNORMAL HIGH (ref 70–99)
Potassium: 5.7 mmol/L — ABNORMAL HIGH (ref 3.5–5.1)
Sodium: 137 mmol/L (ref 135–145)
Total Bilirubin: 1.3 mg/dL — ABNORMAL HIGH (ref 0.3–1.2)
Total Protein: 5.7 g/dL — ABNORMAL LOW (ref 6.5–8.1)

## 2020-10-09 LAB — TYPE AND SCREEN
ABO/RH(D): O NEG
Antibody Screen: NEGATIVE

## 2020-10-09 LAB — TROPONIN I (HIGH SENSITIVITY): Troponin I (High Sensitivity): 555 ng/L (ref <18)

## 2020-10-09 LAB — LACTIC ACID, PLASMA: Lactic Acid, Venous: 1 mmol/L (ref 0.5–1.9)

## 2020-10-09 MED ORDER — SODIUM CHLORIDE 0.9 % IV SOLN
2.0000 g | Freq: Once | INTRAVENOUS | Status: AC
  Administered 2020-10-09: 2 g via INTRAVENOUS
  Filled 2020-10-09: qty 2

## 2020-10-09 MED ORDER — SODIUM CHLORIDE 0.9 % IV BOLUS
1000.0000 mL | Freq: Once | INTRAVENOUS | Status: AC
  Administered 2020-10-09: 1000 mL via INTRAVENOUS

## 2020-10-09 MED ORDER — SODIUM CHLORIDE 0.9 % IV SOLN: 2.0000 g | INTRAVENOUS | Status: DC

## 2020-10-09 MED ORDER — SODIUM ZIRCONIUM CYCLOSILICATE 5 G PO PACK
5.0000 g | PACK | Freq: Once | ORAL | Status: AC
  Administered 2020-10-09: 5 g via ORAL
  Filled 2020-10-09: qty 1

## 2020-10-09 MED ORDER — CALCIUM GLUCONATE-NACL 1-0.675 GM/50ML-% IV SOLN
1.0000 g | Freq: Once | INTRAVENOUS | Status: AC
  Administered 2020-10-10: 1000 mg via INTRAVENOUS
  Filled 2020-10-09: qty 50

## 2020-10-09 MED ORDER — VANCOMYCIN HCL 750 MG/150ML IV SOLN: 750.0000 mg | INTRAVENOUS | Status: DC

## 2020-10-09 MED ORDER — HYDROCODONE-ACETAMINOPHEN 5-325 MG PO TABS
1.0000 | ORAL_TABLET | Freq: Four times a day (QID) | ORAL | Status: DC | PRN
Start: 2020-10-09 — End: 2020-10-13
  Administered 2020-10-10 – 2020-10-11 (×2): 2 via ORAL
  Administered 2020-10-11: 1 via ORAL
  Administered 2020-10-11 – 2020-10-12 (×2): 2 via ORAL
  Administered 2020-10-12: 1 via ORAL
  Administered 2020-10-12 – 2020-10-13 (×2): 2 via ORAL
  Filled 2020-10-09 (×2): qty 2
  Filled 2020-10-09: qty 1
  Filled 2020-10-09 (×2): qty 2
  Filled 2020-10-09: qty 1
  Filled 2020-10-09 (×2): qty 2

## 2020-10-09 MED ORDER — VANCOMYCIN HCL 1750 MG/350ML IV SOLN
1750.0000 mg | Freq: Once | INTRAVENOUS | Status: AC
  Administered 2020-10-10: 1750 mg via INTRAVENOUS
  Filled 2020-10-09: qty 350

## 2020-10-09 MED ORDER — ASPIRIN 325 MG PO TABS
325.0000 mg | ORAL_TABLET | Freq: Once | ORAL | Status: AC
  Administered 2020-10-09: 325 mg via ORAL
  Filled 2020-10-09: qty 1

## 2020-10-09 MED ORDER — METRONIDAZOLE 500 MG/100ML IV SOLN
500.0000 mg | Freq: Two times a day (BID) | INTRAVENOUS | Status: DC
  Administered 2020-10-09: 500 mg via INTRAVENOUS
  Filled 2020-10-09: qty 100

## 2020-10-09 MED ORDER — SODIUM CHLORIDE 0.9 % IV SOLN: Freq: Once | INTRAVENOUS | Status: AC

## 2020-10-09 MED ORDER — FENTANYL CITRATE (PF) 100 MCG/2ML IJ SOLN
50.0000 ug | Freq: Once | INTRAMUSCULAR | Status: AC
  Administered 2020-10-09: 50 ug via INTRAVENOUS
  Filled 2020-10-09: qty 2

## 2020-10-09 NOTE — ED Notes (Signed)
MD to bedside during triage d/t SBP 76. 1L NS via pressure bag initiated.

## 2020-10-09 NOTE — ED Provider Notes (Signed)
[facility]-EMERGENCY DEPT Provider Note   CSN: [id number] Arrival date & time: 10/09/20  9702     History Chief Complaint  Patient presents with   Heather [name] is a 75 y.o. female.  HPI She was at home when she fell.  She is not sure why she fell but describes her left knee and upper leg twisting backwards.  She states that when she was walking today her legs "felt like jelly."  She ate well today but yesterday had several episodes of vomiting and diarrhea.  She presents by EMS after receiving fentanyl during transport for suspected facture.  On arrival she is hypotensive.  She denies headache, neck pain, chest pain, weakness or dizziness at this time.     Past Medical History:  Diagnosis Date   Anxiety    Closed right ankle fracture    Complication of anesthesia    Depression    Headache    Hyperlipidemia    Hypertension    IBS (irritable bowel syndrome)    PONV (postoperative nausea and vomiting)    Seasonal allergies     Patient Active Problem List   Diagnosis Date Noted   Hypotension 10/09/2020   Burst fracture of lumbar vertebra (HCC) 05/14/2020   Chronic cough 12/29/2019   Essential hypertension 12/29/2019   Pneumonia due to COVID-19 virus 12/29/2019    Past Surgical History:  Procedure Laterality Date   APPENDECTOMY     JOINT REPLACEMENT Bilateral    TKA   LUMBAR LAMINECTOMY/DECOMPRESSION MICRODISCECTOMY N/A 05/15/2020   Procedure: LUMBAR LAMINECTOMY/DECOMPRESSION OF Lumbar three;  Surgeon: Maeola Harman, MD;  Location: [facility] OR;  Service: Neurosurgery;  Laterality: N/A;   LUMBAR PERCUTANEOUS PEDICLE SCREW 2 LEVEL N/A 05/15/2020   Procedure: LUMBAR PERCUTANEOUS SCREW, Lumbar one - Lumbar two, Lumbar four - Lumbar five;  Surgeon: Maeola Harman, MD;  Location: [facility] OR;  Service: Neurosurgery;  Laterality: N/A;   ORIF ANKLE FRACTURE Right 06/05/2018   Procedure: OPEN REDUCTION INTERNAL FIXATION (ORIF) ANKLE FRACTURE;  Surgeon:  Sheral Apley, MD;  Location: [facility];  Service: Orthopedics;  Laterality: Right;     OB History   No obstetric history on file.     No family history on file.  Social History   Tobacco Use   Smoking status: Former   Smokeless tobacco: Never  Substance Use Topics   Alcohol use: Never   Drug use: Never    Home Medications Prior to Admission medications   Medication Sig Start Date End Date Taking? Authorizing Provider  albuterol (VENTOLIN HFA) 108 (90 Base) MCG/ACT inhaler Inhale 2 puffs into the lungs every 6 (six) hours as needed for wheezing or shortness of breath. 07/12/19  Yes [provider]  buPROPion (WELLBUTRIN XL) 300 MG 24 hr tablet Take 300 mg by mouth daily.   Yes [provider]  celecoxib (CELEBREX) 200 MG capsule Take 200 mg by mouth 2 (two) times daily.   Yes [provider]  dicyclomine (BENTYL) 20 MG tablet Take 20 mg by mouth every 6 (six) hours as needed for spasms. 02/08/20  Yes [provider]  escitalopram (LEXAPRO) 20 MG tablet Take 20 mg by mouth daily. 03/29/20  Yes [provider]  fluticasone (FLONASE) 50 MCG/ACT nasal spray Place 1 spray into both nostrils daily.   Yes [provider]  gabapentin (NEURONTIN) 300 MG capsule Take 300-600 mg by mouth 2 (two) times daily. Take 1 capsule (300 mg) in  the morning & Take 2 capsules (600 mg) at bedtime   Yes [provider]  HYDROcodone-acetaminophen (NORCO/VICODIN) 5-325 MG tablet Take 1-2 tablets by mouth every 4 (four) hours as needed for severe pain ((score 7 to 10)). 05/24/20  Yes Maeola Harman, MD  methocarbamol (ROBAXIN) 500 MG tablet Take 500 mg by mouth every 12 (twelve) hours as needed for muscle spasms.   Yes [provider]  montelukast (SINGULAIR) 10 MG tablet Take 10 mg by mouth at bedtime.   Yes [provider]  omeprazole (PRILOSEC) 40 MG capsule Take 1 capsule (40 mg total) by mouth daily. 12/29/19  Yes  Nyoka Cowden, MD  ondansetron (ZOFRAN-ODT) 4 MG disintegrating tablet Take 4 mg by mouth every 8 (eight) hours as needed for nausea or vomiting. 10/08/20  Yes [provider]  rosuvastatin (CRESTOR) 10 MG tablet Take 10 mg by mouth daily.   Yes [provider]  tacrolimus (PROTOPIC) 0.03 % ointment Apply 1 application topically 2 (two) times daily. 02/25/20  Yes [provider]  traZODone (DESYREL) 150 MG tablet Take 150 mg by mouth at bedtime. 09/19/20  Yes [provider]  valsartan (DIOVAN) 80 MG tablet Take 1 tablet (80 mg total) by mouth daily. 12/29/19  Yes Nyoka Cowden, MD    Allergies    Augmentin [amoxicillin-pot clavulanate]  Review of Systems   Review of Systems  All other systems reviewed and are negative.  Physical Exam Updated Vital Signs BP (!) 103/56   Pulse 83   Temp (!) 97.5 F (36.4 C) (Oral)   Resp 17   SpO2 99%   Physical Exam Vitals and nursing note reviewed.  Constitutional:      General: She is in acute distress (Hypotensive on arrival).     Appearance: She is well-developed. She is not ill-appearing, toxic-appearing or diaphoretic.     Comments: Elderly female who appears chronically ill and frail.  HENT:     Head: Normocephalic and atraumatic.     Right Ear: External ear normal.     Left Ear: External ear normal.  Eyes:     Conjunctiva/sclera: Conjunctivae normal.     Pupils: Pupils are equal, round, and reactive to light.  Neck:     Trachea: Phonation normal.  Cardiovascular:     Rate and Rhythm: Normal rate.  Pulmonary:     Effort: Pulmonary effort is normal.  Chest:     Chest wall: No tenderness.  Abdominal:     General: There is no distension.     Tenderness: There is no abdominal tenderness.  Musculoskeletal:        General: Normal range of motion.     Cervical back: Normal range of motion and neck supple.     Comments: Left knee region swelling, with decreased mobility consistent with distal femur  fracture.  Skin:    General: Skin is warm and dry.  Neurological:     Mental Status: She is alert and oriented to person, place, and time.     Cranial Nerves: No cranial nerve deficit.     Sensory: No sensory deficit.     Motor: No abnormal muscle tone.     Coordination: Coordination normal.  Psychiatric:        Mood and Affect: Mood normal.        Behavior: Behavior normal.        Thought Content: Thought content normal.        Judgment: Judgment normal.  ED Results / Procedures / Treatments   Labs (all labs ordered are listed, but only abnormal results are displayed) Labs Reviewed  COMPREHENSIVE METABOLIC PANEL - Abnormal; Notable for the following components:      Result Value   Potassium 5.7 (*)    CO2 18 (*)    Glucose, Bld 105 (*)    BUN 30 (*)    Creatinine, Ser 2.21 (*)    Calcium 6.6 (*)    Total Protein 5.7 (*)    Albumin 3.0 (*)    Total Bilirubin 1.3 (*)    GFR, Estimated 23 (*)    All other components within normal limits  CBC WITH DIFFERENTIAL/PLATELET - Abnormal; Notable for the following components:   WBC 18.6 (*)    RDW 18.1 (*)    Platelets 149 (*)    Neutro Abs 13.7 (*)    Monocytes Absolute 1.6 (*)    Abs Immature Granulocytes 0.09 (*)    All other components within normal limits  RESP PANEL BY RT-PCR (FLU A&B, COVID) ARPGX2  CULTURE, BLOOD (ROUTINE X 2)  CULTURE, BLOOD (ROUTINE X 2)  LACTIC ACID, PLASMA  URINALYSIS, ROUTINE W REFLEX MICROSCOPIC  TYPE AND SCREEN  TROPONIN I (HIGH SENSITIVITY)    EKG EKG Interpretation  Date/Time:  Sunday October 09 2020 18:39:45 EDT Ventricular Rate:  84 PR Interval:  184 QRS Duration: 98 QT Interval:  533 QTC Calculation: 631 R Axis:   -49 Text Interpretation: Sinus rhythm Inferior infarct, old Abnrm T, probable ischemia, anterolateral lds Prolonged QT interval Since last tracing T wave abnormality and prolonged QT are new Otherwise no significant change Confirmed by Mancel Bale [phone]) on 10/09/2020  9:01:42 PM  Radiology DG Chest Port 1 View  Result Date: 10/09/2020 CLINICAL DATA:  Weakness.  Patient fell x2 today. EXAM: PORTABLE CHEST 1 VIEW COMPARISON:  12/29/2019 FINDINGS: Heart size is accentuated by AP portable technique. The lungs are clear. No pneumothorax. No consolidation or pulmonary edema. Remote RIGHT shoulder arthroplasty. IMPRESSION: No evidence for acute  abnormality. Electronically Signed   By: Norva Pavlov M.D.   On: 10/09/2020 19:39   DG Knee Left Port  Result Date: 10/09/2020 CLINICAL DATA:  Fall, left knee pain EXAM: PORTABLE LEFT KNEE - 1-2 VIEW COMPARISON:  None. FINDINGS: Two view radiograph left knee demonstrates a a comminuted fracture of the distal left femoral diaphysis extending to the level of the femoral arthroplasty component with prominent medial and posterior butterfly components. There is mild medial and moderate posterior angulation of the distal fracture fragment. 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 Arthroplasty components are in expected alignment. Small left knee effusion is present. IMPRESSION: Comminuted, angulated fracture of the distal left femur just above the femoral arthroplasty component. Electronically Signed   By: Helyn Numbers M.D.   On: 10/09/2020 19:04    Procedures .Critical Care  Date/Time: 10/09/2020 9:12 PM Performed by: Mancel Bale, MD Authorized by: Mancel Bale, MD   Critical care provider statement:    Critical care time (minutes):  80   Critical care start time:  10/09/2020 6:40 PM   Critical care end time:  10/09/2020 9:13 PM   Critical care time was exclusive of:  Separately billable procedures and treating other patients   Critical care was necessary to treat or prevent imminent or life-threatening deterioration of the following conditions:  Trauma   Critical care was time spent personally by me on the following activities:  Blood draw for specimens, development of treatment plan  with patient or surrogate,  discussions with consultants, evaluation of patient's response to treatment, examination of patient, obtaining history from patient or surrogate, ordering and performing treatments and interventions, ordering and review of laboratory studies, pulse oximetry, re-evaluation of patient's condition, review of old charts and ordering and review of radiographic studies   Medications Ordered in ED Medications  sodium zirconium cyclosilicate (LOKELMA) packet 5 g (has no administration in time range)  sodium chloride 0.9 % bolus 1,000 mL (0 mLs Intravenous Stopped 10/09/20 1932)  sodium chloride 0.9 % bolus 1,000 mL (1,000 mLs Intravenous Bolus 10/09/20 1930)  0.9 %  sodium chloride infusion ( Intravenous New Bag/Given 10/09/20 2021)  fentaNYL (SUBLIMAZE) injection 50 mcg (50 mcg Intravenous Given 10/09/20 2020)    ED Course  I have reviewed the triage vital signs and the nursing notes.  Pertinent labs & imaging results that were available during my care of the patient were reviewed by me and considered in my medical decision making (see chart for details).  Clinical Course as of 10/09/20 2113  Wynelle Link Oct 09, 2020  1610 She has persistent hypotension after 3 L of normal saline.  She is mentating well.  Labs pending.  We will give another liter of fluid and reassess. [EW]  2040 Last 2 blood pressures are improved, 90/69 and 103/56.  We will give patient fentanyl 50 mics, for pain [EW]  2102 I discussed the abnormal EKG with the cardiology fellow on-call.  His service will evaluate the patient after transfer for assessment and treatment if needed. [EW]    Clinical Course User Index [EW] Mancel Bale, MD   MDM Rules/Calculators/A&P                            Patient Vitals for the past 24 hrs:  BP Temp Temp src Pulse Resp SpO2  10/09/20 2015 (!) 103/56 -- -- 83 17 99 %  10/09/20 2007 90/69 -- -- 80 (!) 24 92 %  10/09/20 2001 -- -- -- 82 20 98 %  10/09/20 1947 103/72 -- -- -- (!) 29 --  10/09/20  1937 (!) 82/45 -- -- (!) 49 16 (!) 89 %  10/09/20 1915 (!) 75/51 -- -- 61 18 99 %  10/09/20 1846 (!) 83/49 -- -- -- (!) 25 --  10/09/20 1845 -- -- -- 75 (!) 21 94 %  10/09/20 1843 (!) 74/46 (!) 97.5 F (36.4 C) Oral 80 14 (!) 84 %  10/09/20 1835 -- -- -- -- -- 98 %    8:41 PM Reevaluation with update and discussion. After initial assessment and treatment, an updated evaluation reveals no additional complaints, patient updated on findings and plan. Mancel Bale   Medical Decision Making:  This patient is presenting for evaluation of injury to left knee area, secondary to fall, which does require a range of treatment options, and is a complaint that involves a high risk of morbidity and mortality. The differential diagnoses include fracture, contusion, preceding illness. I decided to review old records, and in summary elderly female, fell today while walking in her home, she describes weakness of her legs prior to the fall.  I did not require additional historical information from anyone.  Clinical Laboratory Tests Ordered, included CBC, Metabolic panel, Urinalysis, and blood cultures, lactate, viral panel . Review indicates initial findings normal except white count high, platelets slightly low, potassium high, CO2 low, glucose high, BUN high, creatinine high, calcium low, total protein low,  albumin low, total bilirubin high. Radiologic Tests Ordered, included left knee x-ray, chest x-ray.  I independently Visualized: Radiograph images, which show left distal femur fracture above her prosthesis; no infiltrate or edema of the chest   Critical Interventions-clinical evaluation, laboratory testing, IV fluids, repeat doses of IV boluses for persistent low blood pressure.  Additional laboratory tests ordered.  Blood pressure stabilized, and she was treated with pain medicine for fracture pain.  Observation and reassessment.  Discussion with orthopedics, they plan on operative intervention  tomorrow.  After These Interventions, the Patient was reevaluated and was found with fall likely secondary to weakness from dehydration.  She did not have syncope patient did have some vomiting and diarrhea yesterday which may have led to the dehydration.  She is overall frail and appears chronically ill.  Blood pressure improved after 4 L of saline, she did not require pressors.  Initial lactate is normal.  BUN and creatinine are abnormal with likely secondary hyperkalemia.  Lokelma given for hyperkalemia.  White count is elevated.  Nonspecific, urinalysis is pending.  No apparent pneumonia.  Incidental, abnormal EKG, without chest pain or clinical concern for acute cardiac disorder.  This may be secondary to her hyperkalemia.  Troponin ordered, and cardiology consultation will be requested.  Patient requires hospitalization for stabilization followed by orthopedic surgery.    CRITICAL CARE-yes Performed by: Mancel Bale  Nursing Notes Reviewed/ Care Coordinated Applicable Imaging Reviewed Interpretation of Laboratory Data incorporated into ED treatment   8:49 PM-Consult complete with hospitalist. Patient case explained and discussed.  She agrees to admit patient for further evaluation and treatment. Call ended at 9:12 PM    Final Clinical Impression(s) / ED Diagnoses Final diagnoses:  Closed fracture of distal end of left femur, unspecified fracture morphology, initial encounter (HCC)  AKI (acute kidney injury) (HCC)  Hyperkalemia  Dehydration  Fall, initial encounter  Hypotension due to hypovolemia  Abnormal EKG  Prolonged Q-T interval on ECG    Rx / DC Orders ED Discharge Orders     None        Mancel Bale, MD 10/09/20 2113

## 2020-10-09 NOTE — Progress Notes (Signed)
Orthopedics consulted for distal femur fracture in a patient with a total knee arthroplasty. This requires operative fixation. Will need transfer to [facility]. NPO at midnight. Formal consultation to follow.  Alfonse Alpers, PA-C 10/09/2020
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
## 2020-10-09 NOTE — ED Triage Notes (Signed)
Pt BIB [facility] from home C/O fall X 2 today. Pt reports getting L knee caught under her during second fall. Pt now C/O swelling, pain to L knee.

## 2020-10-09 NOTE — Progress Notes (Signed)
Pharmacy Antibiotic Note  Heather [name] is a 75 y.o. female admitted on 10/09/2020 with sepsis.  Pharmacy has been consulted for Vancomycin & Cefepime dosing.  No known source of infection, Afebrile, procalcitonin only 0.14  Plan: Cefepime 2gm IV q24h Decrease Flagyl 500mg  IV q12h Vancomycin 1750mg  IV x1 then 150mg  IV q48h to target AUC 400-550 Check Vancomycin levels at steady state Monitor renal function and cx data  Assess appropriateness to de-escalate antibiotics daily     Temp (24hrs), Avg:97.5 F (36.4 C), Min:97.5 F (36.4 C), Max:97.5 F (36.4 C)  Recent Labs  Lab 10/09/20 1840 10/09/20 2000  WBC 18.6*  --   CREATININE 2.21*  --   LATICACIDVEN  --  1.0    CrCl cannot be calculated (Unknown ideal weight.).    Allergies  Allergen Reactions   Augmentin [Amoxicillin-Pot Clavulanate]     Has tolerated Ancef    Antimicrobials this admission: 8/22 Cefepime >>  8/22 Flagyl >>  8/22 Vancomycin >>  Dose adjustments this admission:  Microbiology results: 8/21 BCx:   Thank you for allowing pharmacy to be a part of this patient's care.  9/22 PharmD 10/09/2020 11:20 PM

## 2020-10-09 NOTE — ED Notes (Signed)
Portable Xray at bedside.

## 2020-10-10 ENCOUNTER — Inpatient Hospital Stay (HOSPITAL_COMMUNITY): Payer: Medicare HMO

## 2020-10-10 DIAGNOSIS — N179 Acute kidney failure, unspecified: Secondary | ICD-10-CM

## 2020-10-10 DIAGNOSIS — R778 Other specified abnormalities of plasma proteins: Secondary | ICD-10-CM

## 2020-10-10 DIAGNOSIS — R9431 Abnormal electrocardiogram [ECG] [EKG]: Secondary | ICD-10-CM

## 2020-10-10 DIAGNOSIS — I10 Essential (primary) hypertension: Secondary | ICD-10-CM | POA: Diagnosis not present

## 2020-10-10 DIAGNOSIS — S72402A Unspecified fracture of lower end of left femur, initial encounter for closed fracture: Secondary | ICD-10-CM | POA: Diagnosis not present

## 2020-10-10 LAB — BLOOD CULTURE ID PANEL (REFLEXED) - BCID2

## 2020-10-10 LAB — CBC WITH DIFFERENTIAL/PLATELET
Abs Immature Granulocytes: 0.06 10*3/uL (ref 0.00–0.07)
Basophils Absolute: 0 10*3/uL (ref 0.0–0.1)
Basophils Relative: 0 %
Eosinophils Absolute: 0.2 10*3/uL (ref 0.0–0.5)
Eosinophils Relative: 2 %
HCT: 30.2 % — ABNORMAL LOW (ref 36.0–46.0)
Hemoglobin: 9.4 g/dL — ABNORMAL LOW (ref 12.0–15.0)
Immature Granulocytes: 1 %
Lymphocytes Relative: 13 %
Lymphs Abs: 1.7 10*3/uL (ref 0.7–4.0)
MCH: 27 pg (ref 26.0–34.0)
MCHC: 31.1 g/dL (ref 30.0–36.0)
MCV: 86.8 fL (ref 80.0–100.0)
Monocytes Absolute: 0.8 10*3/uL (ref 0.1–1.0)
Monocytes Relative: 6 %
Neutro Abs: 10.3 10*3/uL — ABNORMAL HIGH (ref 1.7–7.7)
Neutrophils Relative %: 78 %
Platelets: 116 10*3/uL — ABNORMAL LOW (ref 150–400)
RBC: 3.48 MIL/uL — ABNORMAL LOW (ref 3.87–5.11)
RDW: 18.2 % — ABNORMAL HIGH (ref 11.5–15.5)
WBC: 13.1 10*3/uL — ABNORMAL HIGH (ref 4.0–10.5)
nRBC: 0 % (ref 0.0–0.2)

## 2020-10-10 LAB — PROCALCITONIN: Procalcitonin: <0.1 ng/mL

## 2020-10-10 LAB — BASIC METABOLIC PANEL
Anion gap: 6 (ref 5–15)
BUN: 24 mg/dL — ABNORMAL HIGH (ref 8–23)
CO2: 19 mmol/L — ABNORMAL LOW (ref 22–32)
Calcium: 6.1 mg/dL — CL (ref 8.9–10.3)
Chloride: 116 mmol/L — ABNORMAL HIGH (ref 98–111)
Creatinine, Ser: 1.4 mg/dL — ABNORMAL HIGH (ref 0.44–1.00)
GFR, Estimated: 40 mL/min — ABNORMAL LOW (ref >60)
Glucose, Bld: 73 mg/dL (ref 70–99)
Potassium: 4.1 mmol/L (ref 3.5–5.1)
Sodium: 141 mmol/L (ref 135–145)

## 2020-10-10 LAB — URINALYSIS, ROUTINE W REFLEX MICROSCOPIC
Bacteria, UA: NONE SEEN
Bilirubin Urine: NEGATIVE
Glucose, UA: NEGATIVE mg/dL
Hgb urine dipstick: NEGATIVE
Ketones, ur: NEGATIVE mg/dL
Leukocytes,Ua: NEGATIVE
Nitrite: NEGATIVE
Protein, ur: 30 mg/dL — AB
Specific Gravity, Urine: 1.02 (ref 1.005–1.030)
pH: 5 (ref 5.0–8.0)

## 2020-10-10 LAB — VITAMIN D 25 HYDROXY (VIT D DEFICIENCY, FRACTURES): Vit D, 25-Hydroxy: 31.24 ng/mL (ref 30–100)

## 2020-10-10 LAB — COMPREHENSIVE METABOLIC PANEL
ALT: 12 U/L (ref 0–44)
AST: 15 U/L (ref 15–41)
Albumin: 2.5 g/dL — ABNORMAL LOW (ref 3.5–5.0)
Alkaline Phosphatase: 44 U/L (ref 38–126)
Anion gap: 6 (ref 5–15)
BUN: 20 mg/dL (ref 8–23)
CO2: 18 mmol/L — ABNORMAL LOW (ref 22–32)
Calcium: 6.6 mg/dL — ABNORMAL LOW (ref 8.9–10.3)
Chloride: 115 mmol/L — ABNORMAL HIGH (ref 98–111)
Creatinine, Ser: 1.1 mg/dL — ABNORMAL HIGH (ref 0.44–1.00)
GFR, Estimated: 53 mL/min — ABNORMAL LOW (ref >60)
Glucose, Bld: 84 mg/dL (ref 70–99)
Potassium: 4 mmol/L (ref 3.5–5.1)
Sodium: 139 mmol/L (ref 135–145)
Total Bilirubin: 0.6 mg/dL (ref 0.3–1.2)
Total Protein: 4.7 g/dL — ABNORMAL LOW (ref 6.5–8.1)

## 2020-10-10 LAB — MAGNESIUM: Magnesium: 1.7 mg/dL (ref 1.7–2.4)

## 2020-10-10 LAB — PHOSPHORUS: Phosphorus: 2.5 mg/dL (ref 2.5–4.6)

## 2020-10-10 LAB — ECHOCARDIOGRAM COMPLETE
Area-P 1/2: 4.8 cm2
S' Lateral: 2.4 cm
Single Plane A4C EF: 50.1 %

## 2020-10-10 LAB — TSH: TSH: 3.324 u[IU]/mL (ref 0.350–4.500)

## 2020-10-10 LAB — TROPONIN I (HIGH SENSITIVITY)
Troponin I (High Sensitivity): 316 ng/L (ref <18)
Troponin I (High Sensitivity): 295 ng/L (ref <18)
Troponin I (High Sensitivity): 294 ng/L (ref <18)

## 2020-10-10 LAB — HIV ANTIBODY (ROUTINE TESTING W REFLEX): HIV Screen 4th Generation wRfx: NONREACTIVE

## 2020-10-10 LAB — SODIUM, URINE, RANDOM: Sodium, Ur: 16 mmol/L

## 2020-10-10 LAB — RESP PANEL BY RT-PCR (FLU A&B, COVID) ARPGX2
Influenza A by PCR: NEGATIVE
Influenza B by PCR: NEGATIVE
SARS Coronavirus 2 by RT PCR: NEGATIVE

## 2020-10-10 LAB — CREATININE, URINE, RANDOM: Creatinine, Urine: 239.18 mg/dL

## 2020-10-10 LAB — CK: Total CK: 75 U/L (ref 38–234)

## 2020-10-10 LAB — HEPARIN LEVEL (UNFRACTIONATED)
Heparin Unfractionated: <0.1 IU/mL — ABNORMAL LOW (ref 0.30–0.70)
Heparin Unfractionated: 0.23 IU/mL — ABNORMAL LOW (ref 0.30–0.70)

## 2020-10-10 LAB — OSMOLALITY, URINE: Osmolality, Ur: 508 mOsm/kg (ref 300–900)

## 2020-10-10 LAB — LIPASE, BLOOD: Lipase: 28 U/L (ref 11–51)

## 2020-10-10 LAB — PROTIME-INR
INR: 1.5 — ABNORMAL HIGH (ref 0.8–1.2)
Prothrombin Time: 18.1 seconds — ABNORMAL HIGH (ref 11.4–15.2)

## 2020-10-10 LAB — D-DIMER, QUANTITATIVE: D-Dimer, Quant: 5.13 ug/mL-FEU — ABNORMAL HIGH (ref 0.00–0.50)

## 2020-10-10 LAB — LACTIC ACID, PLASMA: Lactic Acid, Venous: 0.9 mmol/L (ref 0.5–1.9)

## 2020-10-10 LAB — MRSA NEXT GEN BY PCR, NASAL: MRSA by PCR Next Gen: NOT DETECTED

## 2020-10-10 LAB — BRAIN NATRIURETIC PEPTIDE: B Natriuretic Peptide: 417.2 pg/mL — ABNORMAL HIGH (ref 0.0–100.0)

## 2020-10-10 MED ORDER — METOPROLOL TARTRATE 5 MG/5ML IV SOLN
2.5000 mg | Freq: Once | INTRAVENOUS | Status: AC
  Administered 2020-10-10: 2.5 mg via INTRAVENOUS
  Filled 2020-10-10: qty 5

## 2020-10-10 MED ORDER — HEPARIN BOLUS VIA INFUSION
2000.0000 [IU] | Freq: Once | INTRAVENOUS | Status: AC
  Administered 2020-10-10: 2000 [IU] via INTRAVENOUS
  Filled 2020-10-10: qty 2000

## 2020-10-10 MED ORDER — GABAPENTIN 300 MG PO CAPS: 300.0000 mg | ORAL_CAPSULE | Freq: Two times a day (BID) | ORAL | Status: DC

## 2020-10-10 MED ORDER — GABAPENTIN 300 MG PO CAPS
600.0000 mg | ORAL_CAPSULE | Freq: Every day | ORAL | Status: DC
  Administered 2020-10-10 – 2020-10-17 (×8): 600 mg via ORAL
  Filled 2020-10-10 (×8): qty 2

## 2020-10-10 MED ORDER — TECHNETIUM TO 99M ALBUMIN AGGREGATED
4.4000 | Freq: Once | INTRAVENOUS | Status: AC
  Administered 2020-10-10: 4.4 via INTRAVENOUS

## 2020-10-10 MED ORDER — METHOCARBAMOL 500 MG PO TABS
500.0000 mg | ORAL_TABLET | Freq: Four times a day (QID) | ORAL | Status: DC | PRN
  Administered 2020-10-10 – 2020-10-16 (×8): 500 mg via ORAL
  Filled 2020-10-10 (×8): qty 1

## 2020-10-10 MED ORDER — METHOCARBAMOL 1000 MG/10ML IJ SOLN
500.0000 mg | Freq: Four times a day (QID) | INTRAVENOUS | Status: DC | PRN
  Filled 2020-10-10: qty 5

## 2020-10-10 MED ORDER — SODIUM CHLORIDE 0.9 % IV SOLN: Freq: Once | INTRAVENOUS | Status: AC

## 2020-10-10 MED ORDER — CHLORHEXIDINE GLUCONATE CLOTH 2 % EX PADS
6.0000 | MEDICATED_PAD | Freq: Every day | CUTANEOUS | Status: DC
  Administered 2020-10-10 – 2020-10-14 (×5): 6 via TOPICAL

## 2020-10-10 MED ORDER — ALBUTEROL SULFATE HFA 108 (90 BASE) MCG/ACT IN AERS
2.0000 | INHALATION_SPRAY | Freq: Four times a day (QID) | RESPIRATORY_TRACT | Status: DC | PRN
  Filled 2020-10-10: qty 6.7

## 2020-10-10 MED ORDER — ROSUVASTATIN CALCIUM 5 MG PO TABS
10.0000 mg | ORAL_TABLET | Freq: Every day | ORAL | Status: DC
  Administered 2020-10-10 – 2020-10-18 (×9): 10 mg via ORAL
  Filled 2020-10-10 (×7): qty 2
  Filled 2020-10-10: qty 1
  Filled 2020-10-10: qty 2

## 2020-10-10 MED ORDER — GABAPENTIN 300 MG PO CAPS
300.0000 mg | ORAL_CAPSULE | Freq: Every day | ORAL | Status: DC
  Administered 2020-10-10 – 2020-10-18 (×9): 300 mg via ORAL
  Filled 2020-10-10 (×9): qty 1

## 2020-10-10 MED ORDER — BUPROPION HCL ER (XL) 150 MG PO TB24
300.0000 mg | ORAL_TABLET | Freq: Every day | ORAL | Status: DC
  Administered 2020-10-10 – 2020-10-18 (×9): 300 mg via ORAL
  Filled 2020-10-10 (×9): qty 2

## 2020-10-10 MED ORDER — MAGNESIUM SULFATE 2 GM/50ML IV SOLN
2.0000 g | Freq: Once | INTRAVENOUS | Status: AC
  Administered 2020-10-10: 2 g via INTRAVENOUS
  Filled 2020-10-10: qty 50

## 2020-10-10 MED ORDER — HEPARIN (PORCINE) 25000 UT/250ML-% IV SOLN
1600.0000 [IU]/h | INTRAVENOUS | Status: DC
  Administered 2020-10-10: 1400 [IU]/h via INTRAVENOUS
  Administered 2020-10-10: 1100 [IU]/h via INTRAVENOUS
  Filled 2020-10-10 (×2): qty 250

## 2020-10-10 NOTE — Evaluation (Signed)
Clinical/Bedside Swallow Evaluation Patient Details  Name: Heather [name] MRN: [id number] Date of Birth: 15-Nov-1945  Today's Date: 10/10/2020 Time: SLP Start Time (ACUTE ONLY): 1025 SLP Stop Time (ACUTE ONLY): 1040 SLP Time Calculation (min) (ACUTE ONLY): 15 min  Past Medical History:  Past Medical History:  Diagnosis Date   Anxiety    Closed right ankle fracture    Complication of anesthesia    Depression    Headache    Hyperlipidemia    Hypertension    IBS (irritable bowel syndrome)    PONV (postoperative nausea and vomiting)    Seasonal allergies    Past Surgical History:  Past Surgical History:  Procedure Laterality Date   APPENDECTOMY     JOINT REPLACEMENT Bilateral    TKA   LUMBAR LAMINECTOMY/DECOMPRESSION MICRODISCECTOMY N/A 05/15/2020   Procedure: LUMBAR LAMINECTOMY/DECOMPRESSION OF Lumbar three;  Surgeon: Maeola Harman, MD;  Location: [facility] OR;  Service: Neurosurgery;  Laterality: N/A;   LUMBAR PERCUTANEOUS PEDICLE SCREW 2 LEVEL N/A 05/15/2020   Procedure: LUMBAR PERCUTANEOUS SCREW, Lumbar one - Lumbar two, Lumbar four - Lumbar five;  Surgeon: Maeola Harman, MD;  Location: [facility] OR;  Service: Neurosurgery;  Laterality: N/A;   ORIF ANKLE FRACTURE Right 06/05/2018   Procedure: OPEN REDUCTION INTERNAL FIXATION (ORIF) ANKLE FRACTURE;  Surgeon: Sheral Apley, MD;  Location: [facility];  Service: Orthopedics;  Laterality: Right;   HPI:  Patient is a 75 y.o. female with PMH: hypertension, asthma, bilateral knee replacement, crest syndrome and hyperlipidemia, IBS, anxiety, HLD who presented to the ED after fall (second one of the day) when she caught her knee underneath her. She was hypotensive in ED and had a few episodes of nausea and vomiting, diarrhea. CXR did not reveal any acute abnormality. She suffered a comminuted, angulated fracture of the distal left femur with planed ORIF on 8/23. Patient had c/o recent history of difficulty swallowing.    Assessment / Plan / Recommendation Clinical Impression  Patient presents with an oropharyngeal swallow that appears WFL-WNL. No overt s/s aspiration or penetration with liquids or solids, oral and pharyngeal phases of swallow appeared timely. Patient took medication tablets one at a time with water and did not exhibit or c/o any difficulty with this. She is edentulous and reports eating soft solids at home as she does not wear her dentures (per her report they do not help and are more of a hinderance). SLP is recommending change solids to Dys 3 as this seems to be what patient was consuming prior to admission. No f/u SLP warranted and patient is expected to tolerate PO's without significant difficulty. SLP Visit Diagnosis: Dysphagia, unspecified (R13.10)    Aspiration Risk  No limitations    Diet Recommendation Dysphagia 3 (Mech soft);Thin liquid   Liquid Administration via: Straw;Cup Medication Administration: Whole meds with liquid Supervision: Patient able to self feed Compensations: Slow rate;Small sips/bites Postural Changes: Seated upright at 90 degrees    Other  Recommendations Oral Care Recommendations: Oral care BID   Follow up Recommendations None      Frequency and Duration   N/A         Prognosis   N/A     Swallow Study   General Date of Onset: 10/10/20 HPI: Patient is a 75 y.o. female with PMH: hypertension, asthma, bilateral knee replacement, crest syndrome and hyperlipidemia, IBS, anxiety, HLD who presented to the ED after fall (second one of the day) when she caught her knee underneath her. She was hypotensive in  ED and had a few episodes of nausea and vomiting, diarrhea. CXR did not reveal any acute abnormality. She suffered a comminuted, angulated fracture of the distal left femur with planed ORIF on 8/23. Patient had c/o recent history of difficulty swallowing. Type of Study: Bedside Swallow Evaluation Previous Swallow Assessment: None found Diet Prior to  this Study: Regular;Thin liquids Temperature Spikes Noted: No Respiratory Status: Nasal cannula History of Recent Intubation: No Behavior/Cognition: Alert;Cooperative;Pleasant mood Oral Cavity Assessment: Within Functional Limits Oral Care Completed by SLP: No Oral Cavity - Dentition: Edentulous;Other (Comment) (patient reports she doesnt wear her dentures but eats soft foods prior to admission) Vision: Functional for self-feeding Self-Feeding Abilities: Able to feed self Patient Positioning: Upright in bed Baseline Vocal Quality: Normal Volitional Cough: Strong Volitional Swallow: Able to elicit    Oral/Motor/Sensory Function Overall Oral Motor/Sensory Function: Within functional limits   Ice Chips     Thin Liquid Thin Liquid: Within functional limits Presentation: Straw;Self Fed    Nectar Thick     Honey Thick     Puree Puree: Within functional limits Presentation: Self Fed   Solid     Solid: Not tested     Angela Nevin, MA, CCC-SLP Speech Therapy

## 2020-10-10 NOTE — Consult Note (Signed)
Cardiology Consultation:   Patient ID: Heather [name] MRN: [id number]; DOB: 1945/07/14  Admit date: 10/09/2020 Date of Consult: 10/10/2020  PCP:  Janece Canterbury, MD   [facility] Providers Cardiologist: New   Patient Profile:   Heather [name] is a 75 y.o. female with a hx of hypertension, asthma, bilateral knee replacement, crest syndrome and hyperlipidemia who is being seen 10/10/2020 for the evaluation of elevated troponin at the request of Dr. Jacqulyn Bath.  History of Present Illness:   Heather [name] was in usual state of health up until Saturday morning when woke up with feeling sick.  She had nausea, vomiting, diarrhea, chest pain and shortness of breath.  She had chicken and mashed potato from restaurant night before.  Nothing unusual.  She was evaluated in urgent care and diagnosed with gastroenteritis.  She never had abdominal pain.  Given Zofran.  However she continued to get worse with less appetite.  Yesterday she had 2 falls due to "legs shaking".  No loss of consciousness.  During 2nd fall, patient falls backwards and caught her knee underneath.  Significant pain.  EMS was called and brought to ER for further evaluation.  On arrival to ER she was noted to be hypotensive with systolic blood pressure of 76.  Treated with normal saline.  Workup reveled comminuted, angulated fracture of the distal left femur just above the femoral arthroplasty component>>> pending orthopedic eval and transferred to [facility].  Apart from this she is also noted to have AKI>> Scr improved from 2.21 to 1.1 with hydration.  Hs-troponin 555>>316>>295>>294 D-dimer > 5 >> pending VQ scan  Chest x-ray without acute abnormality BNP 417 Potassium 5.7>>4.0   Patient reports no prior cardiac complaints or evaluation.  Remote history of tobacco smoking, (about 40-pack-year), 15 years ago.   Past Medical History:  Diagnosis Date   Anxiety    Closed right ankle fracture    Complication of  anesthesia    Depression    Headache    Hyperlipidemia    Hypertension    IBS (irritable bowel syndrome)    PONV (postoperative nausea and vomiting)    Seasonal allergies     Past Surgical History:  Procedure Laterality Date   APPENDECTOMY     JOINT REPLACEMENT Bilateral    TKA   LUMBAR LAMINECTOMY/DECOMPRESSION MICRODISCECTOMY N/A 05/15/2020   Procedure: LUMBAR LAMINECTOMY/DECOMPRESSION OF Lumbar three;  Surgeon: Maeola Harman, MD;  Location: [facility] OR;  Service: Neurosurgery;  Laterality: N/A;   LUMBAR PERCUTANEOUS PEDICLE SCREW 2 LEVEL N/A 05/15/2020   Procedure: LUMBAR PERCUTANEOUS SCREW, Lumbar one - Lumbar two, Lumbar four - Lumbar five;  Surgeon: Maeola Harman, MD;  Location: [facility] OR;  Service: Neurosurgery;  Laterality: N/A;   ORIF ANKLE FRACTURE Right 06/05/2018   Procedure: OPEN REDUCTION INTERNAL FIXATION (ORIF) ANKLE FRACTURE;  Surgeon: Sheral Apley, MD;  Location: [facility];  Service: Orthopedics;  Laterality: Right;    Inpatient Medications: Scheduled Meds:  buPROPion  300 mg Oral Daily   gabapentin  300 mg Oral Daily   And   gabapentin  600 mg Oral QHS   rosuvastatin  10 mg Oral Daily   Continuous Infusions:  heparin 1,100 Units/hr (10/10/20 0321)   methocarbamol (ROBAXIN) IV     PRN Meds: albuterol, HYDROcodone-acetaminophen, methocarbamol **OR** methocarbamol (ROBAXIN) IV  Allergies:    Allergies  Allergen Reactions   Augmentin [Amoxicillin-Pot Clavulanate]     Has tolerated Ancef    Social History:   Social History  Socioeconomic History   Marital status: Single    Spouse name: Not on file   Number of children: Not on file   Years of education: Not on file   Highest education level: Not on file  Occupational History   Not on file  Tobacco Use   Smoking status: Former   Smokeless tobacco: Never  Substance and Sexual Activity   Alcohol use: Never   Drug use: Never   Sexual activity: Not on file  Other Topics Concern   Not on  file  Social History Narrative   ** Merged History Encounter **       Social Determinants of Health   Financial Resource Strain: Not on file  Food Insecurity: Not on file  Transportation Needs: Not on file  Physical Activity: Not on file  Stress: Not on file  Social Connections: Not on file  Intimate Partner Violence: Not on file    Family History:   Family History  Problem Relation Age of Onset   Hypertension Other      ROS:  Please see the history of present illness.  All other ROS reviewed and negative.     Physical Exam/Data:   Vitals:   10/10/20 0930 10/10/20 0945 10/10/20 1000 10/10/20 1100  BP: 129/62  (!) 105/95 (!) 105/95  Pulse: 98 99 98 98  Resp: 18 (!) 25 (!) 29 20  Temp:      TempSrc:      SpO2: 99% 95% 97% 97%    Intake/Output Summary (Last 24 hours) at 10/10/2020 1119 Last data filed at 10/10/2020 0500 Gross per 24 hour  Intake 4054.17 ml  Output 600 ml  Net 3454.17 ml   Last 3 Weights 05/14/2020 12/29/2019 06/05/2018  Weight (lbs) 188 lb 7.9 oz 188 lb 6.4 oz 164 lb 14.5 oz  Weight (kg) 85.5 kg 85.458 kg 74.8 kg     There is no height or weight on file to calculate BMI.  General:  Thin frail female  in no acute distress HEENT: normal Lymph: no adenopathy Neck: no JVD Endocrine:  No thryomegaly Vascular: No carotid bruits; FA pulses 2+ bilaterally without bruits  Cardiac:  normal S1, S2; RRR; no murmur  Lungs:  clear to auscultation bilaterally, no wheezing, rhonchi or rales  Abd: soft, nontender, no hepatomegaly  Ext: no edema Musculoskeletal:  No deformities, L Knee swelling  Skin: warm and dry  Neuro:  CNs 2-12 intact, no focal abnormalities noted Psych:  Normal affect   EKG:  The EKG was personally reviewed and demonstrates:  Sinus rhythm with inferior lateral TWI  Telemetry:  Telemetry was personally reviewed and demonstrates:  Sinus rhythm/tachycardia at rate of 90-100s  Relevant CV Studies:  Pending echo   Laboratory Data:  High  Sensitivity Troponin:   Recent Labs  Lab 10/09/20 2056 10/09/20 2317 10/10/20 0100 10/10/20 0540  TROPONINIHS 555* 316* 295* 294*     Chemistry Recent Labs  Lab 10/09/20 1840 10/10/20 0000 10/10/20 0540  NA 137 141 139  K 5.7* 4.1 4.0  CL 111 116* 115*  CO2 18* 19* 18*  GLUCOSE 105* 73 84  BUN 30* 24* 20  CREATININE 2.21* 1.40* 1.10*  CALCIUM 6.6* 6.1* 6.6*  GFRNONAA 23* 40* 53*  ANIONGAP 8 6 6     Recent Labs  Lab 10/09/20 1840 10/10/20 0540  PROT 5.7* 4.7*  ALBUMIN 3.0* 2.5*  AST 30 15  ALT 13 12  ALKPHOS 54 44  BILITOT 1.3* 0.6   Hematology Recent  Labs  Lab 10/09/20 1840 10/10/20 0639  WBC 18.6* 13.1*  RBC 4.86 3.48*  HGB 13.1 9.4*  HCT 42.4 30.2*  MCV 87.2 86.8  MCH 27.0 27.0  MCHC 30.9 31.1  RDW 18.1* 18.2*  PLT 149* 116*   BNP Recent Labs  Lab 10/09/20 1925  BNP 417.2*    DDimer  Recent Labs  Lab 10/10/20 0000  DDIMER 5.13*     Radiology/Studies:  DG Chest Port 1 View  Result Date: 10/09/2020 CLINICAL DATA:  Weakness.  Patient fell x2 today. EXAM: PORTABLE CHEST 1 VIEW COMPARISON:  12/29/2019 FINDINGS: Heart size is accentuated by AP portable technique. The lungs are clear. No pneumothorax. No consolidation or pulmonary edema. Remote RIGHT shoulder arthroplasty. IMPRESSION: No evidence for acute  abnormality. Electronically Signed   By: Norva Pavlov M.D.   On: 10/09/2020 19:39   DG Knee Left Port  Result Date: 10/09/2020 CLINICAL DATA:  Fall, left knee pain EXAM: PORTABLE LEFT KNEE - 1-2 VIEW COMPARISON:  None. FINDINGS: Two view radiograph left knee demonstrates a a comminuted fracture of the distal left femoral diaphysis extending to the level of the femoral arthroplasty component with prominent medial and posterior butterfly components. There is mild medial and moderate posterior angulation of the distal fracture fragment. 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 Arthroplasty components are in expected alignment. Small left  knee effusion is present. IMPRESSION: Comminuted, angulated fracture of the distal left femur just above the femoral arthroplasty component. Electronically Signed   By: Helyn Numbers M.D.   On: 10/09/2020 19:04     Assessment and Plan:   Chest pain with elevated troponin / NSTEMI -Her chest pain sounds pleuritic in nature.  No issue prior to 2 days ago.  EKG with inferior lateral T wave inversion concerning for ischemia. Hs-troponin 555>>316>>295>>294.  -Patient is on heparin for anticoagulation currently.  Agree with ruling out pulmonary embolism (pending VQ scan) -Pending echocardiogram -We will review ischemic evaluation planning with Dr. Mayford Knife. -Cardiac risk factor includes hypertension, hyperlipidemia and remote history of tobacco smoking  2.  AKI -Likely due to poor p.o. intake secondary to nausea vomiting diarrhea -Kidney function improved to 1.1 with hydration  3.  Hypertension -Home antihypertensive on hold due to soft blood pressure and AKI  4.  Prolonged QT -Improved to QT/QTcB 449/562 ms - Avoid QT prolonging agent   Risk Assessment/Risk Scores:   TIMI Risk Score for Unstable Angina or Non-ST Elevation MI:   The patient's TIMI risk score is 4, which indicates a 20% risk of all cause mortality, new or recurrent myocardial infarction or need for urgent revascularization in the next 14 days.{   For questions or updates, please contact [facility] Please consult [email or web address] for contact info under    Lorelei Pont, PA  10/10/2020 11:19 AM

## 2020-10-10 NOTE — Progress Notes (Signed)
PHARMACY - PHYSICIAN COMMUNICATION CRITICAL VALUE ALERT - BLOOD CULTURE IDENTIFICATION (BCID)  Heather [name] is an 75 y.o. female who presented to [facility] on 10/09/2020 with a chief complaint of NSTEMI  Assessment:  1/4 BC with MSSE, likely contaminant  Name of physician (or Provider) Contacted: Kirke Shaggy  Current antibiotics: cefepime  Changes to prescribed antibiotics recommended:  No changes  Results for orders placed or performed during the hospital encounter of 10/09/20  Blood Culture ID Panel (Reflexed) (Collected: 10/09/2020  7:55 PM)  Result Value Ref Range   Enterococcus faecalis NOT DETECTED NOT DETECTED   Enterococcus Faecium NOT DETECTED NOT DETECTED   Listeria monocytogenes NOT DETECTED NOT DETECTED   Staphylococcus species DETECTED (A) NOT DETECTED   Staphylococcus aureus (BCID) NOT DETECTED NOT DETECTED   Staphylococcus epidermidis DETECTED (A) NOT DETECTED   Staphylococcus lugdunensis NOT DETECTED NOT DETECTED   Streptococcus species NOT DETECTED NOT DETECTED   Streptococcus agalactiae NOT DETECTED NOT DETECTED   Streptococcus pneumoniae NOT DETECTED NOT DETECTED   Streptococcus pyogenes NOT DETECTED NOT DETECTED   A.calcoaceticus-baumannii NOT DETECTED NOT DETECTED   Bacteroides fragilis NOT DETECTED NOT DETECTED   Enterobacterales NOT DETECTED NOT DETECTED   Enterobacter cloacae complex NOT DETECTED NOT DETECTED   Escherichia coli NOT DETECTED NOT DETECTED   Klebsiella aerogenes NOT DETECTED NOT DETECTED   Klebsiella oxytoca NOT DETECTED NOT DETECTED   Klebsiella pneumoniae NOT DETECTED NOT DETECTED   Proteus species NOT DETECTED NOT DETECTED   Salmonella species NOT DETECTED NOT DETECTED   Serratia marcescens NOT DETECTED NOT DETECTED   Haemophilus influenzae NOT DETECTED NOT DETECTED   Neisseria meningitidis NOT DETECTED NOT DETECTED   Pseudomonas aeruginosa NOT DETECTED NOT DETECTED   Stenotrophomonas maltophilia NOT DETECTED NOT DETECTED    Candida albicans NOT DETECTED NOT DETECTED   Candida auris NOT DETECTED NOT DETECTED   Candida glabrata NOT DETECTED NOT DETECTED   Candida krusei NOT DETECTED NOT DETECTED   Candida parapsilosis NOT DETECTED NOT DETECTED   Candida tropicalis NOT DETECTED NOT DETECTED   Cryptococcus neoformans/gattii NOT DETECTED NOT DETECTED   Methicillin resistance mecA/C DETECTED (A) NOT DETECTED    Woodfin Ganja 10/10/2020  11:37 PM

## 2020-10-10 NOTE — Progress Notes (Addendum)
[facility] night shift PCU coverage note.  The nursing staff reported that the patient is having tachycardia ranging between 105-140 BPN with no associated symptoms.  Her magnesium level this morning was 1.7 mg/dL.  Potassium was 4.1 mmol/L.  She had an echo done today with an EF of 50 to 55% and grade 2 diastolic dysfunction.  Magnesium sulfate 2 g IVPB and metoprolol 2.5 mg IVP x1 dose ordered.  Sanda Klein, MD.

## 2020-10-10 NOTE — Progress Notes (Signed)
  Echocardiogram 2D Echocardiogram has been performed.  Delcie Roch 10/10/2020, 6:27 PM

## 2020-10-10 NOTE — Progress Notes (Signed)
ANTICOAGULATION CONSULT NOTE - Initial Consult  Pharmacy Consult for Heparin Indication: rule out pulmonary embolus  Allergies  Allergen Reactions   Augmentin [Amoxicillin-Pot Clavulanate]     Has tolerated Ancef   Patient Measurements:   Heparin Dosing Weight: 70kg  Vital Signs: Temp: 97.6 F (36.4 C) (08/22 0400) Temp Source: Oral (08/22 0400) BP: 138/73 (08/22 1244) Pulse Rate: 106 (08/22 1244)  Labs: Recent Labs    10/09/20 1840 10/09/20 2056 10/09/20 2317 10/10/20 0000 10/10/20 0100 10/10/20 0540 10/10/20 0639 10/10/20 1226  HGB 13.1  --   --   --   --   --  9.4*  --   HCT 42.4  --   --   --   --   --  30.2*  --   PLT 149*  --   --   --   --   --  116*  --   LABPROT  --   --  18.1*  --   --   --   --   --   INR  --   --  1.5*  --   --   --   --   --   HEPARINUNFRC  --   --   --   --   --   --   --  <0.10*  CREATININE 2.21*  --   --  1.40*  --  1.10*  --   --   CKTOTAL  --   --   --  75  --   --   --   --   TROPONINIHS  --    < > 316*  --  295* 294*  --   --    < > = values in this interval not displayed.    CrCl cannot be calculated (Unknown ideal weight.).  Medical History: Past Medical History:  Diagnosis Date   Anxiety    Closed right ankle fracture    Complication of anesthesia    Depression    Headache    Hyperlipidemia    Hypertension    IBS (irritable bowel syndrome)    PONV (postoperative nausea and vomiting)    Seasonal allergies    Medications:  Infusions:   heparin 1,100 Units/hr (10/10/20 0321)   methocarbamol (ROBAXIN) IV     Assessment: 56 yoF who presents with chest pain, weakness, fall at home with hip fx and persistent hypotension, hypoxia since admission.   DDimer elevated at 5.13. Pharmacy consulted to start IV heparin for possible PE - VQ scan due to AKI completed > neg for PE Heparin bolus 2000 units, begin infusion at 1100 units/hr    Baseline CBC- Hg WNL, pltc slightly low at 149 however patient's baseline appears to  be ~150. She is not on anticoagulation PTA.   Elevated Troponins, NSTEMI   Goal of Therapy:  Heparin level 0.3-0.7 units/ml Monitor platelets by anticoagulation protocol: Yes  Today, 10/10/2020 1230 Hep level < 0.10, level delayed with V/Q scan, no report that heparin was stopped for any length of time.   Plan:  Repeat Heparin bolus 2000 units Increase Heparin rate to 1300 units/hr Check heparin level in 8h Daily CBC, plan daily Hep level at steady state, while on heparin Monitor for s/sx of bleeding  Otho Bellows PharmD 10/10/2020,1:41 PM

## 2020-10-10 NOTE — Progress Notes (Addendum)
Patient admitted to [phone]. Patient complains of no pain at this time. Per [facility] transporter, patient received Fentanyl during transport due to poor pain control prior. Patient placed on 2L Short due to O2 sats at 87% RA.  Patient's only belongings are a cell phone. She is missing teeth but per pt her daughter has her dentures at home. Her VS are stable and telemetry has been initiated.   Patient has an IV in each arm and a foley in place with light yellow urine.

## 2020-10-10 NOTE — ED Notes (Signed)
Called [facility] 5W for report, spoke to <onique, informed nurse was off floor and would call back for report when available, gave reporting name and phone # for callback at that time.

## 2020-10-10 NOTE — Progress Notes (Signed)
Patient was found to have elevated troponins. Currently undergoing cardiac work-up. Per cardiology note, "2D echo is pending.  If EF is down then needs cath.  If EF normal then would pursue coronary CTA.  Continue on IV heparin gtt for now."  Keep NPO at midnight for possible surgical fixation tomorrow if patient gets cleared by cardiology. Heparin will need to be held 6 hours prior to surgery. Formal consultation to follow.   Alfonse Alpers, PA-C 10/10/2020

## 2020-10-10 NOTE — Progress Notes (Signed)
ANTICOAGULATION CONSULT NOTE  Pharmacy Consult for Heparin Indication: NSTEMI  Allergies  Allergen Reactions   Augmentin [Amoxicillin-Pot Clavulanate]     Has tolerated Ancef   Patient Measurements: Height: 5\' 6"  (167.6 cm) Weight: 58.2 kg (128 lb 4.9 oz) IBW/kg (Calculated) : 59.3 Heparin Dosing Weight: 70kg  Vital Signs: Temp: 100.1 F (37.8 C) (08/22 2000) Temp Source: Oral (08/22 2000) BP: 112/81 (08/22 2000) Pulse Rate: 100 (08/22 2000)  Labs: Recent Labs    10/09/20 1840 10/09/20 2056 10/09/20 2317 10/10/20 0000 10/10/20 0100 10/10/20 0540 10/10/20 0639 10/10/20 1226 10/10/20 2211  HGB 13.1  --   --   --   --   --  9.4*  --   --   HCT 42.4  --   --   --   --   --  30.2*  --   --   PLT 149*  --   --   --   --   --  116*  --   --   LABPROT  --   --  18.1*  --   --   --   --   --   --   INR  --   --  1.5*  --   --   --   --   --   --   HEPARINUNFRC  --   --   --   --   --   --   --  <0.10* 0.23*  CREATININE 2.21*  --   --  1.40*  --  1.10*  --   --   --   CKTOTAL  --   --   --  75  --   --   --   --   --   TROPONINIHS  --    < > 316*  --  295* 294*  --   --   --    < > = values in this interval not displayed.    Estimated Creatinine Clearance: 41.8 mL/min (A) (by C-G formula based on SCr of 1.1 mg/dL (H)).  Medical History: Past Medical History:  Diagnosis Date   Anxiety    Closed right ankle fracture    Complication of anesthesia    Depression    Headache    Hyperlipidemia    Hypertension    IBS (irritable bowel syndrome)    PONV (postoperative nausea and vomiting)    Seasonal allergies    Medications:  Infusions:   heparin 1,300 Units/hr (10/10/20 1402)   methocarbamol (ROBAXIN) IV     Assessment: 84 yoF who presents with chest pain, weakness, fall at home with hip fx and persistent hypotension, hypoxia since admission. D-Dimer elevated at 5.13. Pharmacy originally consulted to start IV heparin for possible PE - VQ scan negative for PE. Pt  now also with NSTEMI so heparin will be continued.   Heparin level subtherapeutic (0.23) on gtt at 1300 units/hr. No issues with line or bleeding reported per RN. Hgb down to 9.4 (?whether 13.1 was accurate on admission).  Goal of Therapy:  Heparin level 0.3-0.7 units/ml Monitor platelets by anticoagulation protocol: Yes  Plan:  Increase Heparin rate to 1400 units/hr Check heparin level in 8hr  66, PharmD, BCPS Please see amion for complete clinical pharmacist phone list 10/10/2020,10:50 PM
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
## 2020-10-10 NOTE — Progress Notes (Signed)
ANTICOAGULATION CONSULT NOTE - Initial Consult  Pharmacy Consult for Heparin Indication: rule out pulmonary embolus  Allergies  Allergen Reactions   Augmentin [Amoxicillin-Pot Clavulanate]     Has tolerated Ancef    Patient Measurements:   Heparin Dosing Weight: 70kg  Vital Signs: Temp: 97.5 F (36.4 C) (08/21 1843) Temp Source: Oral (08/21 1843) BP: 100/58 (08/22 0132) Pulse Rate: 93 (08/22 0132)  Labs: Recent Labs    10/09/20 1840 10/09/20 2056 10/09/20 2317 10/10/20 0000 10/10/20 0100  HGB 13.1  --   --   --   --   HCT 42.4  --   --   --   --   PLT 149*  --   --   --   --   LABPROT  --   --  18.1*  --   --   INR  --   --  1.5*  --   --   CREATININE 2.21*  --   --  1.40*  --   CKTOTAL  --   --   --  75  --   TROPONINIHS  --  555* 316*  --  295*    CrCl cannot be calculated (Unknown ideal weight.).   Medical History: Past Medical History:  Diagnosis Date   Anxiety    Closed right ankle fracture    Complication of anesthesia    Depression    Headache    Hyperlipidemia    Hypertension    IBS (irritable bowel syndrome)    PONV (postoperative nausea and vomiting)    Seasonal allergies     Medications:  Infusions:   sodium chloride 125 mL/hr at 10/10/20 0240   ceFEPime (MAXIPIME) IV     metronidazole 500 mg (10/09/20 2328)   vancomycin 1,750 mg (10/10/20 0058)   [START ON 10/11/2020] vancomycin      Assessment: 11 yoF who presents with chest pain, weakness, fall at home with hip fx and persistent hypotension, hypoxia since admission.   DDimer elevated at 5.13. Pharmacy consulted to start IV heparin for possible PE.  Plan to confirm with VQ scan in the morning due AKI.   Baseline CBC- Hg WNL, pltc slightly low at 149 however patient's baseline appears to be ~150. She is not on anticoagulation PTA.    Goal of Therapy:  Heparin level 0.3-0.7 units/ml Monitor platelets by anticoagulation protocol: Yes   Plan:  Heparin 2000 units IV bolus followed  by heparin infusion at 1100 units/hr Check heparin level in 8h Daily heparin level & CBC while on heparin F/U VQ scan results Monitor for s/sx of bleeding  Junita Push PharmD 10/10/2020,2:47 AM

## 2020-10-10 NOTE — Plan of Care (Signed)
Pt presented s/p fall with distal femur fracture. Pending transfer to [facility] for orthopedic evaluation. In ED with soft BP. Admitted initially to hospitalist service.  Called for persistent hypotension despite aggressive fluid resuscitation.  On my exam she is awake, alert and oriented x3 with no acute complaints. MAPs persistently 65-75. Extremities warm without edema. Pulmonary exam unremarkable and is comfortable appearing on 2L with Spo2 99%. Markers of perfusion such as lactate are normal.   Does c/o intermittent chest pain (none on my exam). hsTrop with peak of 555. dDimer elevated and leukocytosis of 19K without obvious source of infection.  In addition primary team has started broad spectrum abx, held home antihypertensives, started empiric heparin gtt with plans for V/Q vs. CTA to assess for pulmonary embolism.  Agree with plan at this time. No indication for ICU bed at this time. Please re-engage if clinical status changes or any questions arise.  Chance Addison Bailey Pulmonary/Critical Care

## 2020-10-11 ENCOUNTER — Encounter (HOSPITAL_COMMUNITY)
Admission: EM | Disposition: A | Discharge status: Rehabilitation Facility | Payer: Self-pay | Source: Home / Self Care | Attending: Family Medicine

## 2020-10-11 DIAGNOSIS — I519 Heart disease, unspecified: Secondary | ICD-10-CM

## 2020-10-11 DIAGNOSIS — I10 Essential (primary) hypertension: Secondary | ICD-10-CM | POA: Diagnosis not present

## 2020-10-11 DIAGNOSIS — I214 Non-ST elevation (NSTEMI) myocardial infarction: Secondary | ICD-10-CM

## 2020-10-11 DIAGNOSIS — I2510 Atherosclerotic heart disease of native coronary artery without angina pectoris: Secondary | ICD-10-CM

## 2020-10-11 DIAGNOSIS — R9431 Abnormal electrocardiogram [ECG] [EKG]: Secondary | ICD-10-CM | POA: Diagnosis not present

## 2020-10-11 DIAGNOSIS — S72402A Unspecified fracture of lower end of left femur, initial encounter for closed fracture: Secondary | ICD-10-CM | POA: Diagnosis not present

## 2020-10-11 DIAGNOSIS — R778 Other specified abnormalities of plasma proteins: Secondary | ICD-10-CM | POA: Diagnosis not present

## 2020-10-11 DIAGNOSIS — N179 Acute kidney failure, unspecified: Secondary | ICD-10-CM | POA: Diagnosis not present

## 2020-10-11 HISTORY — PX: LEFT HEART CATH AND CORONARY ANGIOGRAPHY: CATH118249

## 2020-10-11 LAB — CBC
HCT: 30.2 % — ABNORMAL LOW (ref 36.0–46.0)
Hemoglobin: 9.9 g/dL — ABNORMAL LOW (ref 12.0–15.0)
MCH: 27.5 pg (ref 26.0–34.0)
MCHC: 32.8 g/dL (ref 30.0–36.0)
MCV: 83.9 fL (ref 80.0–100.0)
Platelets: 119 10*3/uL — ABNORMAL LOW (ref 150–400)
RBC: 3.6 MIL/uL — ABNORMAL LOW (ref 3.87–5.11)
RDW: 18 % — ABNORMAL HIGH (ref 11.5–15.5)
WBC: 12.4 10*3/uL — ABNORMAL HIGH (ref 4.0–10.5)
nRBC: 0 % (ref 0.0–0.2)

## 2020-10-11 LAB — RENAL FUNCTION PANEL
Albumin: 2.2 g/dL — ABNORMAL LOW (ref 3.5–5.0)
Anion gap: 5 (ref 5–15)
BUN: 10 mg/dL (ref 8–23)
CO2: 19 mmol/L — ABNORMAL LOW (ref 22–32)
Calcium: 6.6 mg/dL — ABNORMAL LOW (ref 8.9–10.3)
Chloride: 114 mmol/L — ABNORMAL HIGH (ref 98–111)
Creatinine, Ser: 0.86 mg/dL (ref 0.44–1.00)
GFR, Estimated: >60 mL/min (ref >60)
Glucose, Bld: 100 mg/dL — ABNORMAL HIGH (ref 70–99)
Phosphorus: 1.4 mg/dL — ABNORMAL LOW (ref 2.5–4.6)
Potassium: 3.7 mmol/L (ref 3.5–5.1)
Sodium: 138 mmol/L (ref 135–145)

## 2020-10-11 LAB — TROPONIN I (HIGH SENSITIVITY): Troponin I (High Sensitivity): 239 ng/L (ref <18)

## 2020-10-11 LAB — PROCALCITONIN: Procalcitonin: 0.1 ng/mL

## 2020-10-11 LAB — HEPARIN LEVEL (UNFRACTIONATED): Heparin Unfractionated: 0.17 IU/mL — ABNORMAL LOW (ref 0.30–0.70)

## 2020-10-11 SURGERY — LEFT HEART CATH AND CORONARY ANGIOGRAPHY
Anesthesia: LOCAL

## 2020-10-11 MED ORDER — FENTANYL CITRATE (PF) 100 MCG/2ML IJ SOLN
INTRAMUSCULAR | Status: AC
  Filled 2020-10-11: qty 2

## 2020-10-11 MED ORDER — VERAPAMIL HCL 2.5 MG/ML IV SOLN
INTRAVENOUS | Status: DC | PRN
  Administered 2020-10-11: 10 mL via INTRA_ARTERIAL

## 2020-10-11 MED ORDER — HEPARIN (PORCINE) IN NACL 1000-0.9 UT/500ML-% IV SOLN
INTRAVENOUS | Status: AC
  Filled 2020-10-11: qty 500

## 2020-10-11 MED ORDER — IOHEXOL 350 MG/ML SOLN
INTRAVENOUS | Status: DC | PRN
  Administered 2020-10-11: 50 mL via INTRA_ARTERIAL

## 2020-10-11 MED ORDER — ASPIRIN 81 MG PO CHEW: 81.0000 mg | CHEWABLE_TABLET | ORAL | Status: DC

## 2020-10-11 MED ORDER — LACTATED RINGERS IV BOLUS
500.0000 mL | Freq: Once | INTRAVENOUS | Status: AC
  Administered 2020-10-11: 500 mL via INTRAVENOUS

## 2020-10-11 MED ORDER — METOPROLOL TARTRATE 5 MG/5ML IV SOLN
2.5000 mg | Freq: Once | INTRAVENOUS | Status: AC
  Administered 2020-10-11: 2.5 mg via INTRAVENOUS
  Filled 2020-10-11: qty 5

## 2020-10-11 MED ORDER — FENTANYL CITRATE (PF) 100 MCG/2ML IJ SOLN
INTRAMUSCULAR | Status: DC | PRN
  Administered 2020-10-11: 50 ug via INTRAVENOUS

## 2020-10-11 MED ORDER — HEPARIN (PORCINE) IN NACL 1000-0.9 UT/500ML-% IV SOLN
INTRAVENOUS | Status: DC | PRN
  Administered 2020-10-11 (×2): 500 mL

## 2020-10-11 MED ORDER — ALBUMIN HUMAN 25 % IV SOLN
50.0000 g | Freq: Once | INTRAVENOUS | Status: AC
  Administered 2020-10-11: 50 g via INTRAVENOUS
  Filled 2020-10-11: qty 200

## 2020-10-11 MED ORDER — VERAPAMIL HCL 2.5 MG/ML IV SOLN
INTRAVENOUS | Status: AC
  Filled 2020-10-11: qty 2

## 2020-10-11 MED ORDER — SODIUM CHLORIDE 0.9 % IV SOLN: 250.0000 mL | INTRAVENOUS | Status: DC | PRN

## 2020-10-11 MED ORDER — ASPIRIN 81 MG PO CHEW
81.0000 mg | CHEWABLE_TABLET | ORAL | Status: AC
  Administered 2020-10-11: 81 mg via ORAL
  Filled 2020-10-11: qty 1

## 2020-10-11 MED ORDER — SODIUM PHOSPHATES 45 MMOLE/15ML IV SOLN
30.0000 mmol | Freq: Once | INTRAVENOUS | Status: DC
  Filled 2020-10-11: qty 10

## 2020-10-11 MED ORDER — SODIUM CHLORIDE 0.9 % WEIGHT BASED INFUSION: 3.0000 mL/kg/h | INTRAVENOUS | Status: DC

## 2020-10-11 MED ORDER — SODIUM CHLORIDE 0.9% FLUSH
3.0000 mL | Freq: Two times a day (BID) | INTRAVENOUS | Status: DC
  Administered 2020-10-11: 3 mL via INTRAVENOUS

## 2020-10-11 MED ORDER — HEPARIN SODIUM (PORCINE) 1000 UNIT/ML IJ SOLN
INTRAMUSCULAR | Status: DC | PRN
  Administered 2020-10-11: 3000 [IU] via INTRAVENOUS

## 2020-10-11 MED ORDER — POTASSIUM PHOSPHATES 15 MMOLE/5ML IV SOLN
30.0000 mmol | Freq: Once | INTRAVENOUS | Status: AC
  Administered 2020-10-11: 30 mmol via INTRAVENOUS
  Filled 2020-10-11: qty 10

## 2020-10-11 MED ORDER — SODIUM CHLORIDE 0.9% FLUSH: 3.0000 mL | INTRAVENOUS | Status: DC | PRN

## 2020-10-11 MED ORDER — SODIUM CHLORIDE 0.9% FLUSH
3.0000 mL | Freq: Two times a day (BID) | INTRAVENOUS | Status: DC
  Administered 2020-10-12 – 2020-10-17 (×7): 3 mL via INTRAVENOUS

## 2020-10-11 MED ORDER — LIDOCAINE HCL (PF) 1 % IJ SOLN
INTRAMUSCULAR | Status: DC | PRN
  Administered 2020-10-11: 2 mL via INTRADERMAL

## 2020-10-11 MED ORDER — SODIUM CHLORIDE 0.9% FLUSH
3.0000 mL | INTRAVENOUS | Status: DC | PRN
  Administered 2020-10-17: 3 mL via INTRAVENOUS

## 2020-10-11 MED ORDER — HEPARIN SODIUM (PORCINE) 5000 UNIT/ML IJ SOLN
5000.0000 [IU] | Freq: Three times a day (TID) | INTRAMUSCULAR | Status: DC
  Administered 2020-10-11 – 2020-10-13 (×4): 5000 [IU] via SUBCUTANEOUS
  Filled 2020-10-11 (×4): qty 1

## 2020-10-11 MED ORDER — TRAZODONE HCL 50 MG PO TABS
25.0000 mg | ORAL_TABLET | Freq: Every evening | ORAL | Status: DC | PRN
  Administered 2020-10-11 – 2020-10-17 (×6): 25 mg via ORAL
  Filled 2020-10-11 (×6): qty 1

## 2020-10-11 MED ORDER — ADULT MULTIVITAMIN W/MINERALS CH
1.0000 | ORAL_TABLET | Freq: Every day | ORAL | Status: DC
  Administered 2020-10-12 – 2020-10-18 (×7): 1 via ORAL
  Filled 2020-10-11 (×7): qty 1

## 2020-10-11 MED ORDER — MIDAZOLAM HCL 2 MG/2ML IJ SOLN
INTRAMUSCULAR | Status: DC | PRN
  Administered 2020-10-11: 1 mg via INTRAVENOUS

## 2020-10-11 MED ORDER — LIDOCAINE HCL (PF) 1 % IJ SOLN
INTRAMUSCULAR | Status: AC
  Filled 2020-10-11: qty 30

## 2020-10-11 MED ORDER — SODIUM CHLORIDE 0.9 % WEIGHT BASED INFUSION: 1.0000 mL/kg/h | INTRAVENOUS | Status: DC

## 2020-10-11 MED ORDER — HEPARIN BOLUS VIA INFUSION
1000.0000 [IU] | Freq: Once | INTRAVENOUS | Status: AC
  Administered 2020-10-11: 1000 [IU] via INTRAVENOUS
  Filled 2020-10-11: qty 1000

## 2020-10-11 MED ORDER — MIDAZOLAM HCL 2 MG/2ML IJ SOLN
INTRAMUSCULAR | Status: AC
  Filled 2020-10-11: qty 2

## 2020-10-11 MED ORDER — ENSURE ENLIVE PO LIQD
237.0000 mL | Freq: Three times a day (TID) | ORAL | Status: DC
  Administered 2020-10-11 – 2020-10-18 (×14): 237 mL via ORAL
  Filled 2020-10-11: qty 237

## 2020-10-11 MED ORDER — SODIUM CHLORIDE 0.9 % WEIGHT BASED INFUSION: 1.0000 mL/kg/h | INTRAVENOUS | Status: AC

## 2020-10-11 MED ORDER — POTASSIUM PHOSPHATES 15 MMOLE/5ML IV SOLN
30.0000 mmol | Freq: Once | INTRAVENOUS | Status: DC
  Filled 2020-10-11 (×5): qty 10

## 2020-10-11 MED ORDER — CALCIUM GLUCONATE-NACL 2-0.675 GM/100ML-% IV SOLN
2.0000 g | Freq: Once | INTRAVENOUS | Status: AC
  Administered 2020-10-11: 2000 mg via INTRAVENOUS
  Filled 2020-10-11: qty 100

## 2020-10-11 MED ORDER — CALCIUM CARBONATE 1250 (500 CA) MG PO TABS
1.0000 | ORAL_TABLET | Freq: Two times a day (BID) | ORAL | Status: DC
  Administered 2020-10-11 – 2020-10-18 (×14): 500 mg via ORAL
  Filled 2020-10-11 (×14): qty 1

## 2020-10-11 SURGICAL SUPPLY — 11 items
CATH 5FR JL3.5 JR4 ANG PIG MP (CATHETERS) ×1 IMPLANT
DEVICE RAD COMP TR BAND LRG (VASCULAR PRODUCTS) ×1 IMPLANT
GLIDESHEATH SLEND SS 6F .021 (SHEATH) ×1 IMPLANT
GUIDEWIRE INQWIRE 1.5J.035X260 (WIRE) IMPLANT
INQWIRE 1.5J .035X260CM (WIRE) ×2
KIT HEART LEFT (KITS) ×2 IMPLANT
PACK CARDIAC CATHETERIZATION (CUSTOM PROCEDURE TRAY) ×2 IMPLANT
SHEATH PROBE COVER 6X72 (BAG) ×1 IMPLANT
SYR MEDRAD MARK 7 150ML (SYRINGE) ×2 IMPLANT
TRANSDUCER W/STOPCOCK (MISCELLANEOUS) ×2 IMPLANT
TUBING CIL FLEX 10 FLL-RA (TUBING) ×2 IMPLANT

## 2020-10-11 NOTE — Consult Note (Signed)
Reason for Consult:Left distal femur fx Referring Physician: Lurene Shadow Time called: 0730 Time at bedside: 0850   Heather [name] is an 75 y.o. female.  HPI: [name] was at home and fell in the kitchen when her legs gave out. She was helped up by her daughter and was being helped down the hall when they gave out again and she fell. She had immediate left knee pain this time and could not get up. She was brought to [facility] where x-rays showed a distal femur fx and orthopedic surgery was consulted. She was transferred to [facility] due to the need for further cardiac workup. She lives at home with her daughter and generally does not use any assistive devices unless she's out for an extended time.  Past Medical History:  Diagnosis Date   Anxiety    Closed right ankle fracture    Complication of anesthesia    Depression    Headache    Hyperlipidemia    Hypertension    IBS (irritable bowel syndrome)    PONV (postoperative nausea and vomiting)    Seasonal allergies     Past Surgical History:  Procedure Laterality Date   APPENDECTOMY     JOINT REPLACEMENT Bilateral    TKA   LUMBAR LAMINECTOMY/DECOMPRESSION MICRODISCECTOMY N/A 05/15/2020   Procedure: LUMBAR LAMINECTOMY/DECOMPRESSION OF Lumbar three;  Surgeon: Maeola Harman, MD;  Location: [facility] OR;  Service: Neurosurgery;  Laterality: N/A;   LUMBAR PERCUTANEOUS PEDICLE SCREW 2 LEVEL N/A 05/15/2020   Procedure: LUMBAR PERCUTANEOUS SCREW, Lumbar one - Lumbar two, Lumbar four - Lumbar five;  Surgeon: Maeola Harman, MD;  Location: [facility] OR;  Service: Neurosurgery;  Laterality: N/A;   ORIF ANKLE FRACTURE Right 06/05/2018   Procedure: OPEN REDUCTION INTERNAL FIXATION (ORIF) ANKLE FRACTURE;  Surgeon: Sheral Apley, MD;  Location: [facility];  Service: Orthopedics;  Laterality: Right;    Family History  Problem Relation Age of Onset   Hypertension Other     Social History:  reports that she has quit smoking. She has never used  smokeless tobacco. She reports that she does not drink alcohol and does not use drugs.  Allergies:  Allergies  Allergen Reactions   Augmentin [Amoxicillin-Pot Clavulanate]     Has tolerated Ancef    Medications: I have reviewed the patient's current medications.  Results for orders placed or performed during the hospital encounter of 10/09/20 (from the past 48 hour(s))  Comprehensive metabolic panel     Status: Abnormal   Collection Time: 10/09/20  6:40 PM  Result Value Ref Range   Sodium 137 135 - 145 mmol/L   Potassium 5.7 (H) 3.5 - 5.1 mmol/L   Chloride 111 98 - 111 mmol/L   CO2 18 (L) 22 - 32 mmol/L   Glucose, Bld 105 (H) 70 - 99 mg/dL    Comment: Glucose reference range applies only to samples taken after fasting for at least 8 hours.   BUN 30 (H) 8 - 23 mg/dL   Creatinine, Ser 1.61 (H) 0.44 - 1.00 mg/dL   Calcium 6.6 (L) 8.9 - 10.3 mg/dL   Total Protein 5.7 (L) 6.5 - 8.1 g/dL   Albumin 3.0 (L) 3.5 - 5.0 g/dL   AST 30 15 - 41 U/L   ALT 13 0 - 44 U/L   Alkaline Phosphatase 54 38 - 126 U/L   Total Bilirubin 1.3 (H) 0.3 - 1.2 mg/dL   GFR, Estimated 23 (L) >60 mL/min    Comment: (NOTE) Calculated using the  CKD-EPI Creatinine Equation (2021)    Anion gap 8 5 - 15    Comment: Performed at [facility], [address] [place], [place] [address]  CBC with Differential     Status: Abnormal   Collection Time: 10/09/20  6:40 PM  Result Value Ref Range   WBC 18.6 (H) 4.0 - 10.5 K/uL   RBC 4.86 3.87 - 5.11 MIL/uL   Hemoglobin 13.1 12.0 - 15.0 g/dL   HCT 98.1 19.1 - 47.8 %   MCV 87.2 80.0 - 100.0 fL   MCH 27.0 26.0 - 34.0 pg   MCHC 30.9 30.0 - 36.0 g/dL   RDW 29.5 (H) 62.1 - 30.8 %   Platelets 149 (L) 150 - 400 K/uL   nRBC 0.0 0.0 - 0.2 %   Neutrophils Relative % 74 %   Neutro Abs 13.7 (H) 1.7 - 7.7 K/uL   Lymphocytes Relative 17 %   Lymphs Abs 3.2 0.7 - 4.0 K/uL   Monocytes Relative 8 %   Monocytes Absolute 1.6 (H) 0.1 - 1.0 K/uL   Eosinophils  Relative 0 %   Eosinophils Absolute 0.1 0.0 - 0.5 K/uL   Basophils Relative 0 %   Basophils Absolute 0.0 0.0 - 0.1 K/uL   Immature Granulocytes 1 %   Abs Immature Granulocytes 0.09 (H) 0.00 - 0.07 K/uL    Comment: Performed at [facility], [address], [place], [place] [address]  Resp Panel by RT-PCR (Flu A&B, Covid) Nasopharyngeal Swab     Status: None   Collection Time: 10/09/20  6:41 PM   Specimen: Nasopharyngeal Swab; Nasopharyngeal(NP) swabs in vial transport medium  Result Value Ref Range   SARS Coronavirus 2 by RT PCR NEGATIVE NEGATIVE    Comment: (NOTE) SARS-CoV-2 target nucleic acids are NOT DETECTED.  The SARS-CoV-2 RNA is generally detectable in upper respiratory specimens during the acute phase of infection. The lowest concentration of SARS-CoV-2 viral copies this assay can detect is 138 copies/mL. A negative result does not preclude SARS-Cov-2 infection and should not be used as the sole basis for treatment or other patient management decisions. A negative result may occur with  improper specimen collection/handling, submission of specimen other than nasopharyngeal swab, presence of viral mutation(s) within the areas targeted by this assay, and inadequate number of viral copies(<138 copies/mL). A negative result must be combined with clinical observations, patient history, and epidemiological information. The expected result is Negative.  Fact Sheet for Patients:  BloggerCourse.com  Fact Sheet for Healthcare Providers:  SeriousBroker.it  This test is no t yet approved or cleared by the [place] FDA and  has been authorized for detection and/or diagnosis of SARS-CoV-2 by FDA under an Emergency Use Authorization (EUA). This EUA will remain  in effect (meaning this test can be used) for the duration of the COVID-19 declaration under Section 564(b)(1) of the Act, 21 U.S.C.section  360bbb-3(b)(1), unless the authorization is terminated  or revoked sooner.       Influenza A by PCR NEGATIVE NEGATIVE   Influenza B by PCR NEGATIVE NEGATIVE    Comment: (NOTE) The Xpert Xpress SARS-CoV-2/FLU/RSV plus assay is intended as an aid in the diagnosis of influenza from Nasopharyngeal swab specimens and should not be used as a sole basis for treatment. Nasal washings and aspirates are unacceptable for Xpert Xpress SARS-CoV-2/FLU/RSV testing.  Fact Sheet for Patients: BloggerCourse.com  Fact Sheet for Healthcare Providers: SeriousBroker.it  This test is not yet approved or cleared by the [place] FDA and has been authorized for detection and/or diagnosis of SARS-CoV-2 by FDA under an Emergency Use Authorization (EUA). This EUA will remain in effect (meaning this test can be used) for the duration of the COVID-19 declaration under Section 564(b)(1) of the Act, 21 U.S.C. section 360bbb-3(b)(1), unless the authorization is terminated or revoked.  Performed at [facility], [address] [place], [place] [address]   Type and screen     Status: None   Collection Time: 10/09/20  7:25 PM  Result Value Ref Range   ABO/RH(D) O NEG    Antibody Screen NEG    Sample Expiration      10/12/2020,2359 Performed at [facility], [address] [place], [place] [address]   Brain natriuretic peptide     Status: Abnormal   Collection Time: 10/09/20  7:25 PM  Result Value Ref Range   B Natriuretic Peptide 417.2 (H) 0.0 - 100.0 pg/mL    Comment: Performed at [facility], [address] [place], [place] [address]  Culture, blood (routine x 2)     Status: None (Preliminary result)   Collection Time: 10/09/20  7:55 PM   Specimen: BLOOD  Result Value Ref Range   Specimen Description      BLOOD RIGHT ANTECUBITAL Performed at [facility], [address] [place], [place] [address]    Special Requests      BOTTLES DRAWN AEROBIC AND ANAEROBIC Blood Culture results may not be optimal due to an inadequate volume of blood received in culture bottles Performed at [facility], [address], [place], [place] [address]    Culture      NO GROWTH 1 DAY Performed at [facility], [address], [place], [place] [address]    Report Status PENDING   Culture, blood (routine x 2)     Status: None (Preliminary result)   Collection Time: 10/09/20  7:55 PM   Specimen: BLOOD  Result Value Ref Range   Specimen Description      BLOOD LEFT ANTECUBITAL Performed at [facility], [address] [place], [place] [address]    Special Requests      BOTTLES DRAWN AEROBIC AND ANAEROBIC Blood Culture results may not be optimal due to an inadequate volume of blood received in culture bottles Performed at [facility], [address], [place], [place] [address]    Culture  Setup Time      GRAM POSITIVE COCCI IN CLUSTERS IN BOTH AEROBIC AND ANAEROBIC BOTTLES CRITICAL RESULT CALLED TO, READ BACK BY AND VERIFIED WITH: L SEAY,PHARMD@2330  10/10/20 MK Performed at [facility], [address], [place], [place] [address]    Culture GRAM POSITIVE COCCI    Report Status PENDING   Blood Culture ID Panel (Reflexed)     Status: Abnormal   Collection Time: 10/09/20  7:55 PM  Result Value Ref Range   Enterococcus faecalis NOT DETECTED NOT DETECTED   Enterococcus Faecium NOT DETECTED NOT DETECTED   Listeria monocytogenes NOT DETECTED NOT DETECTED   Staphylococcus species DETECTED (A) NOT DETECTED    Comment: CRITICAL RESULT CALLED TO, READ BACK BY AND VERIFIED WITH: L SEAY,PHARMD@2330  10/10/20 MK    Staphylococcus aureus (BCID) NOT DETECTED NOT DETECTED   Staphylococcus epidermidis DETECTED (A) NOT DETECTED    Comment: Methicillin (oxacillin) resistant coagulase negative staphylococcus.  Possible blood culture contaminant (unless isolated from more than one blood culture draw  or clinical case suggests pathogenicity). No antibiotic treatment is indicated for blood  culture contaminants. CRITICAL RESULT CALLED TO, READ BACK BY AND VERIFIED WITH: L SEAY,PHARMD@2330  10/10/20 MK    Staphylococcus lugdunensis NOT DETECTED NOT DETECTED   Streptococcus species NOT DETECTED NOT DETECTED   Streptococcus agalactiae NOT DETECTED NOT DETECTED   Streptococcus pneumoniae NOT DETECTED NOT DETECTED   Streptococcus pyogenes NOT DETECTED NOT DETECTED   A.calcoaceticus-baumannii NOT DETECTED NOT DETECTED   Bacteroides fragilis NOT DETECTED NOT DETECTED   Enterobacterales NOT DETECTED NOT DETECTED   Enterobacter cloacae complex NOT DETECTED NOT DETECTED   Escherichia coli NOT DETECTED NOT DETECTED   Klebsiella aerogenes NOT DETECTED NOT DETECTED   Klebsiella oxytoca NOT DETECTED NOT DETECTED   Klebsiella pneumoniae NOT DETECTED NOT DETECTED   Proteus species NOT DETECTED NOT DETECTED   Salmonella species NOT DETECTED NOT DETECTED   Serratia marcescens NOT DETECTED NOT DETECTED   Haemophilus influenzae NOT DETECTED NOT DETECTED   Neisseria meningitidis NOT DETECTED NOT DETECTED   Pseudomonas aeruginosa NOT DETECTED NOT DETECTED   Stenotrophomonas maltophilia NOT DETECTED NOT DETECTED   Candida albicans NOT DETECTED NOT DETECTED   Candida auris NOT DETECTED NOT DETECTED   Candida glabrata NOT DETECTED NOT DETECTED   Candida krusei NOT DETECTED NOT DETECTED   Candida parapsilosis NOT DETECTED NOT DETECTED   Candida tropicalis NOT DETECTED NOT DETECTED   Cryptococcus neoformans/gattii NOT DETECTED NOT DETECTED   Methicillin resistance mecA/C DETECTED (A) NOT DETECTED    Comment: CRITICAL RESULT CALLED TO, READ BACK BY AND VERIFIED WITH: L SEAY,PHARMD  10/10/20 MK Performed at [facility], [address] [place], [place] [address]   Lactic acid, plasma     Status: None    Collection Time: 10/09/20  8:00 PM  Result Value Ref Range   Lactic Acid, Venous 1.0 0.5 - 1.9 mmol/L    Comment: Performed at [facility], [address], [place], [place] [address]  Troponin I (High Sensitivity)     Status: Abnormal   Collection Time: 10/09/20  8:56 PM  Result Value Ref Range   Troponin I (High Sensitivity) 555 (HH) <18 ng/L    Comment: CRITICAL RESULT CALLED TO, READ BACK BY AND VERIFIED WITH: JACK T. ON 10/09/2020 @ 1020 BY MECIAL J. (NOTE) Elevated high sensitivity troponin I (hsTnI) values and significant  changes across serial measurements may suggest ACS but many other  chronic and acute conditions are known to elevate hsTnI results.  Refer to the Links section for chest pain algorithms and additional  guidance. Performed at [facility], [address], [place], [place] [address]   Procalcitonin - Baseline     Status: None   Collection Time: 10/09/20  8:56 PM  Result Value Ref Range   Procalcitonin 0.14 ng/mL    Comment:        Interpretation: PCT (Procalcitonin) <= 0.5 ng/mL: Systemic infection (sepsis) is not likely. Local bacterial infection is possible. (NOTE)       Sepsis PCT Algorithm           Lower Respiratory Tract                                      Infection PCT Algorithm    ----------------------------     ----------------------------         PCT < 0.25 ng/mL  PCT < 0.10 ng/mL          Strongly encourage             Strongly discourage   discontinuation of antibiotics    initiation of antibiotics    ----------------------------     -----------------------------       PCT 0.25 - 0.50 ng/mL            PCT 0.10 - 0.25 ng/mL               OR       >80% decrease in PCT            Discourage initiation of                                            antibiotics      Encourage discontinuation           of antibiotics    ----------------------------     -----------------------------          PCT >= 0.50 ng/mL              PCT 0.26 - 0.50 ng/mL               AND        <80% decrease in PCT             Encourage initiation of                                             antibiotics       Encourage continuation           of antibiotics    ----------------------------     -----------------------------        PCT >= 0.50 ng/mL                  PCT > 0.50 ng/mL               AND         increase in PCT                  Strongly encourage                                      initiation of antibiotics    Strongly encourage escalation           of antibiotics                                     -----------------------------                                           PCT <= 0.25 ng/mL                                                 OR                                        >  80% decrease in PCT                                      Discontinue / Do not initiate                                             antibiotics  Performed at [facility], [address] [place], [place] [address]   Urinalysis, Routine w reflex microscopic Urine, Catheterized     Status: Abnormal   Collection Time: 10/09/20 11:07 PM  Result Value Ref Range   Color, Urine AMBER (A) YELLOW    Comment: BIOCHEMICALS MAY BE AFFECTED BY COLOR   APPearance HAZY (A) CLEAR   Specific Gravity, Urine 1.020 1.005 - 1.030   pH 5.0 5.0 - 8.0   Glucose, UA NEGATIVE NEGATIVE mg/dL   Hgb urine dipstick NEGATIVE NEGATIVE   Bilirubin Urine NEGATIVE NEGATIVE   Ketones, ur NEGATIVE NEGATIVE mg/dL   Protein, ur 30 (A) NEGATIVE mg/dL   Nitrite NEGATIVE NEGATIVE   Leukocytes,Ua NEGATIVE NEGATIVE   RBC / HPF 0-5 0 - 5 RBC/hpf   WBC, UA 0-5 0 - 5 WBC/hpf   Bacteria, UA NONE SEEN NONE SEEN   Squamous Epithelial / LPF 0-5 0 - 5   Mucus PRESENT    Hyaline Casts, UA PRESENT     Comment: Performed at [facility], [address], [place], [place] [address]  Sodium, urine, random     Status:  None   Collection Time: 10/09/20 11:07 PM  Result Value Ref Range   Sodium, Ur 16 mmol/L    Comment: Performed at [facility], [address], [place], [place] [address]  Creatinine, urine, random     Status: None   Collection Time: 10/09/20 11:07 PM  Result Value Ref Range   Creatinine, Urine 239.18 mg/dL    Comment: Performed at [facility], [address] [place], [place] [address]  Osmolality, urine     Status: None   Collection Time: 10/09/20 11:07 PM  Result Value Ref Range   Osmolality, Ur 508 300 - 900 mOsm/kg    Comment: Performed at [facility], [address] [place], [place] [address]  Troponin I (High Sensitivity)     Status: Abnormal   Collection Time: 10/09/20 11:17 PM  Result Value Ref Range   Troponin I (High Sensitivity) 316 (HH) <18 ng/L    Comment: CRITICAL VALUE NOTED.  VALUE IS CONSISTENT WITH PREVIOUSLY REPORTED AND CALLED VALUE. (NOTE) Elevated high sensitivity troponin I (hsTnI) values and significant  changes across serial measurements may suggest ACS but many other  chronic and acute conditions are known to elevate hsTnI results.  Refer to the Links section for chest pain algorithms and additional  guidance. Performed at [facility], [address], [place], [place] [address]   Lactic acid, plasma     Status: None   Collection Time: 10/09/20 11:17 PM  Result Value Ref Range   Lactic Acid, Venous 0.9 0.5 - 1.9 mmol/L    Comment: Performed at [facility], [address], [place], [place] [address]  Protime-INR     Status: Abnormal   Collection Time: 10/09/20 11:17 PM  Result Value Ref Range  Prothrombin Time 18.1 (H) 11.4 - 15.2 seconds   INR 1.5 (H) 0.8 - 1.2    Comment: (NOTE) INR goal varies based on device and disease states. Performed at [facility], [address] [place], [place] [address]   TSH     Status: None   Collection Time:  10/09/20 11:21 PM  Result Value Ref Range   TSH 3.324 0.350 - 4.500 uIU/mL    Comment: Performed by a 3rd Generation assay with a functional sensitivity of <=0.01 uIU/mL. Performed at [facility], [address] [place], [place] [address]   Basic metabolic panel     Status: Abnormal   Collection Time: 10/10/20 12:00 AM  Result Value Ref Range   Sodium 141 135 - 145 mmol/L   Potassium 4.1 3.5 - 5.1 mmol/L    Comment: NO VISIBLE HEMOLYSIS DELTA CHECK NOTED    Chloride 116 (H) 98 - 111 mmol/L   CO2 19 (L) 22 - 32 mmol/L   Glucose, Bld 73 70 - 99 mg/dL    Comment: Glucose reference range applies only to samples taken after fasting for at least 8 hours.   BUN 24 (H) 8 - 23 mg/dL   Creatinine, Ser 3.41 (H) 0.44 - 1.00 mg/dL   Calcium 6.1 (LL) 8.9 - 10.3 mg/dL    Comment: CRITICAL RESULT CALLED TO, READ BACK BY AND VERIFIED WITH: RIMANBO J. RN ON 10/10/2020 @0240  BY MECIAL J.    GFR, Estimated 40 (L) >60 mL/min    Comment: (NOTE) Calculated using the CKD-EPI Creatinine Equation (2021)    Anion gap 6 5 - 15    Comment: Performed at [facility], [address] [place], [place]  CK     Status: None   Collection Time: 10/10/20 12:00 AM  Result Value Ref Range   Total CK 75 38 - 234 U/L    Comment: Performed at [facility], [address] [place], [place]  Magnesium     Status: None   Collection Time: 10/10/20 12:00 AM  Result Value Ref Range   Magnesium 1.7 1.7 - 2.4 mg/dL    Comment: Performed at [facility], [address] [place], [place]  Phosphorus     Status: None   Collection Time: 10/10/20 12:00 AM  Result Value Ref Range   Phosphorus 2.5 2.5 - 4.6 mg/dL    Comment: Performed at [facility], [address] [place], [place]  Lipase, blood     Status: None   Collection Time: 10/10/20 12:00 AM  Result Value Ref Range   Lipase 28 11  - 51 U/L    Comment: Performed at [facility], [address], [place], [place]  D-dimer, quantitative     Status: Abnormal   Collection Time: 10/10/20 12:00 AM  Result Value Ref Range   D-Dimer, Quant 5.13 (H) 0.00 - 0.50 ug/mL-FEU    Comment: (NOTE) At the manufacturer cut-off value of 0.5 g/mL FEU, this assay has a negative predictive value of 95-100%.This assay is intended for use in conjunction with a clinical pretest probability (PTP) assessment model to exclude pulmonary embolism (PE) and deep venous thrombosis (DVT) in outpatients suspected of PE or DVT. Results should be correlated with clinical presentation. Performed at [facility], [address] [place], [place]   Troponin I (High Sensitivity)     Status: Abnormal  Collection Time: 10/10/20  1:00 AM  Result Value Ref Range   Troponin I (High Sensitivity) 295 (HH) <18 ng/L    Comment: CRITICAL VALUE NOTED.  VALUE IS CONSISTENT WITH PREVIOUSLY REPORTED AND CALLED VALUE. (NOTE) Elevated high sensitivity troponin I (hsTnI) values and significant  changes across serial measurements may suggest ACS but many other  chronic and acute conditions are known to elevate hsTnI results.  Refer to the Links section for chest pain algorithms and additional  guidance. Performed at [facility], [address], [place], [place] [address]   Procalcitonin     Status: None   Collection Time: 10/10/20  1:10 AM  Result Value Ref Range   Procalcitonin <0.10 ng/mL    Comment:        Interpretation: PCT (Procalcitonin) <= 0.5 ng/mL: Systemic infection (sepsis) is not likely. Local bacterial infection is possible. (NOTE)       Sepsis PCT Algorithm           Lower Respiratory Tract                                      Infection PCT Algorithm    ----------------------------     ----------------------------         PCT < 0.25 ng/mL                PCT < 0.10  ng/mL          Strongly encourage             Strongly discourage   discontinuation of antibiotics    initiation of antibiotics    ----------------------------     -----------------------------       PCT 0.25 - 0.50 ng/mL            PCT 0.10 - 0.25 ng/mL               OR       >80% decrease in PCT            Discourage initiation of                                            antibiotics      Encourage discontinuation           of antibiotics    ----------------------------     -----------------------------         PCT >= 0.50 ng/mL              PCT 0.26 - 0.50 ng/mL               AND        <80% decrease in PCT             Encourage initiation of                                             antibiotics       Encourage continuation           of antibiotics    ----------------------------     -----------------------------        PCT >= 0.50 ng/mL  PCT > 0.50 ng/mL               AND         increase in PCT                  Strongly encourage                                      initiation of antibiotics    Strongly encourage escalation           of antibiotics                                     -----------------------------                                           PCT <= 0.25 ng/mL                                                 OR                                        > 80% decrease in PCT                                      Discontinue / Do not initiate                                             antibiotics  Performed at [facility], [address], [place], [place] [address]   HIV Antibody (routine testing w rflx)     Status: None   Collection Time: 10/10/20  1:10 AM  Result Value Ref Range   HIV Screen 4th Generation wRfx Non Reactive Non Reactive    Comment: Performed at [facility], [address] [place], [place] [address]  Troponin I (High Sensitivity)     Status: Abnormal   Collection Time: 10/10/20  5:40 AM  Result  Value Ref Range   Troponin I (High Sensitivity) 294 (HH) <18 ng/L    Comment: CRITICAL VALUE NOTED.  VALUE IS CONSISTENT WITH PREVIOUSLY REPORTED AND CALLED VALUE. (NOTE) Elevated high sensitivity troponin I (hsTnI) values and significant  changes across serial measurements may suggest ACS but many other  chronic and acute conditions are known to elevate hsTnI results.  Refer to the Links section for chest pain algorithms and additional  guidance. Performed at [facility], [address], [place], [place] [address]   VITAMIN D 25 Hydroxy (Vit-D Deficiency, Fractures)     Status: None   Collection Time: 10/10/20  5:40 AM  Result Value Ref Range   Vit D, 25-Hydroxy 31.24 30 - 100 ng/mL    Comment: (NOTE) Vitamin D deficiency has been defined by  the Institute of Medicine  and an Endocrine Society practice guideline as a level of serum 25-OH  vitamin D less than 20 ng/mL (1,2). The Endocrine Society went on to  further define vitamin D insufficiency as a level between 21 and 29  ng/mL (2).  1. IOM (Institute of Medicine). 2010. Dietary reference intakes for  calcium and D. [place]: [employer]. 2. Holick MF, [facility], Bischoff-Ferrari HA, et al. Evaluation,  treatment, and prevention of vitamin D deficiency: an Endocrine  Society clinical practice guideline, JCEM. 2011 Jul; 96(7): 1911-30.  Performed at [facility], [address] [place], [place] [address]   Comprehensive metabolic panel     Status: Abnormal   Collection Time: 10/10/20  5:40 AM  Result Value Ref Range   Sodium 139 135 - 145 mmol/L   Potassium 4.0 3.5 - 5.1 mmol/L   Chloride 115 (H) 98 - 111 mmol/L   CO2 18 (L) 22 - 32 mmol/L   Glucose, Bld 84 70 - 99 mg/dL    Comment: Glucose reference range applies only to samples taken after fasting for at least 8 hours.   BUN 20 8 - 23 mg/dL   Creatinine, Ser 1.60 (H) 0.44 - 1.00 mg/dL   Calcium 6.6 (L) 8.9 - 10.3 mg/dL    Total Protein 4.7 (L) 6.5 - 8.1 g/dL   Albumin 2.5 (L) 3.5 - 5.0 g/dL   AST 15 15 - 41 U/L   ALT 12 0 - 44 U/L   Alkaline Phosphatase 44 38 - 126 U/L   Total Bilirubin 0.6 0.3 - 1.2 mg/dL   GFR, Estimated 53 (L) >60 mL/min    Comment: (NOTE) Calculated using the CKD-EPI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

## 2020-10-11 NOTE — Progress Notes (Signed)
Orthopedic Tech Progress Note Patient Details:  Heather [name] 1946-02-17 [id number]  Ortho Devices Type of Ortho Device: Knee Immobilizer Ortho Device/Splint Location: LLE Ortho Device/Splint Interventions: Ordered, Application, Adjustment   Post Interventions Patient Tolerated: Well Instructions Provided: Care of device  [name] 10/11/2020, 4:18 PM
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
## 2020-10-11 NOTE — Progress Notes (Addendum)
Progress Note  Patient Name: Heather [name] Date of Encounter: 10/11/2020  [facility] Cardiologist: None   Subjective   Had an episode of SVT last night.  Denies any chest pain or SOB  Inpatient Medications    Scheduled Meds:  buPROPion  300 mg Oral Daily   Chlorhexidine Gluconate Cloth  6 each Topical Daily   gabapentin  300 mg Oral Daily   And   gabapentin  600 mg Oral QHS   rosuvastatin  10 mg Oral Daily   Continuous Infusions:  heparin 1,600 Units/hr (10/11/20 0750)   methocarbamol (ROBAXIN) IV     PRN Meds: albuterol, HYDROcodone-acetaminophen, methocarbamol **OR** methocarbamol (ROBAXIN) IV   Vital Signs    Vitals:   10/11/20 0500 10/11/20 0600 10/11/20 0645 10/11/20 0714  BP:   118/71 132/75  Pulse: 97 96 (!) 10 100  Resp:  20 20 (!) 23  Temp:   99.1 F (37.3 C) 99.3 F (37.4 C)  TempSrc:   Oral Oral  SpO2: 93% 92% 92% 92%  Weight:      Height:        Intake/Output Summary (Last 24 hours) at 10/11/2020 0834 Last data filed at 10/11/2020 0807 Gross per 24 hour  Intake 1066.58 ml  Output 1825 ml  Net -758.42 ml   Last 3 Weights 10/10/2020 05/14/2020 12/29/2019  Weight (lbs) 128 lb 4.9 oz 188 lb 7.9 oz 188 lb 6.4 oz  Weight (kg) 58.2 kg 85.5 kg 85.458 kg      Telemetry    NSR with run of SVT - Personally Reviewed  ECG    SVT with inferolateral ST abnormality - Personally Reviewed  Physical Exam   GEN: No acute distress.   Neck: No JVD Cardiac: RRR, no murmurs, rubs, or gallops.  Respiratory: Clear to auscultation bilaterally. GI: Soft, nontender, non-distended  MS: No edema; No deformity. Neuro:  Nonfocal  Psych: Normal affect   Labs    High Sensitivity Troponin:   Recent Labs  Lab 10/09/20 2056 10/09/20 2317 10/10/20 0100 10/10/20 0540 10/11/20 0037  TROPONINIHS 555* 316* 295* 294* 239*      Chemistry Recent Labs  Lab 10/09/20 1840 10/10/20 0000 10/10/20 0540 10/11/20 0037  NA 137 141 139 138  K 5.7* 4.1  4.0 3.7  CL 111 116* 115* 114*  CO2 18* 19* 18* 19*  GLUCOSE 105* 73 84 100*  BUN 30* 24* 20 10  CREATININE 2.21* 1.40* 1.10* 0.86  CALCIUM 6.6* 6.1* 6.6* 6.6*  PROT 5.7*  --  4.7*  --   ALBUMIN 3.0*  --  2.5* 2.2*  AST 30  --  15  --   ALT 13  --  12  --   ALKPHOS 54  --  44  --   BILITOT 1.3*  --  0.6  --   GFRNONAA 23* 40* 53* >60  ANIONGAP 8 6 6 5      Hematology Recent Labs  Lab 10/09/20 1840 10/10/20 0639 10/11/20 0037  WBC 18.6* 13.1* 12.4*  RBC 4.86 3.48* 3.60*  HGB 13.1 9.4* 9.9*  HCT 42.4 30.2* 30.2*  MCV 87.2 86.8 83.9  MCH 27.0 27.0 27.5  MCHC 30.9 31.1 32.8  RDW 18.1* 18.2* 18.0*  PLT 149* 116* 119*    BNP Recent Labs  Lab 10/09/20 1925  BNP 417.2*     DDimer  Recent Labs  Lab 10/10/20 0000  DDIMER 5.13*    Radiology    NM Pulmonary Perfusion  Result Date:  10/10/2020 CLINICAL DATA:  Pulmonary emboli suspected, high probability. Shortness of breath over the last 3 days. EXAM: NUCLEAR MEDICINE PERFUSION LUNG SCAN TECHNIQUE: Perfusion images were obtained in multiple projections after intravenous injection of radiopharmaceutical. Ventilation scans intentionally deferred if perfusion scan and chest x-ray adequate for interpretation during COVID 19 epidemic. RADIOPHARMACEUTICALS:  4.4 mCi Tc-23m MAA IV COMPARISON:  Chest radiography yesterday. FINDINGS: Negative perfusion study.  No evidence of focal defect. IMPRESSION: Negative/normal nuclear medicine pulmonary perfusion study. No visible defect. Electronically Signed   By: Paulina Fusi M.D.   On: 10/10/2020 11:43   DG Chest Port 1 View  Result Date: 10/09/2020 CLINICAL DATA:  Weakness.  Patient fell x2 today. EXAM: PORTABLE CHEST 1 VIEW COMPARISON:  12/29/2019 FINDINGS: Heart size is accentuated by AP portable technique. The lungs are clear. No pneumothorax. No consolidation or pulmonary edema. Remote RIGHT shoulder arthroplasty. IMPRESSION: No evidence for acute  abnormality. Electronically Signed    By: Norva Pavlov M.D.   On: 10/09/2020 19:39   DG Knee Left Port  Result Date: 10/09/2020 CLINICAL DATA:  Fall, left knee pain EXAM: PORTABLE LEFT KNEE - 1-2 VIEW COMPARISON:  None. FINDINGS: Two view radiograph left knee demonstrates a a comminuted fracture of the distal left femoral diaphysis extending to the level of the femoral arthroplasty component with prominent medial and posterior butterfly components. There is mild medial and moderate posterior angulation of the distal fracture fragment. 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 Arthroplasty components are in expected alignment. Small left knee effusion is present. IMPRESSION: Comminuted, angulated fracture of the distal left femur just above the femoral arthroplasty component. Electronically Signed   By: Helyn Numbers M.D.   On: 10/09/2020 19:04   ECHOCARDIOGRAM COMPLETE  Result Date: 10/10/2020    ECHOCARDIOGRAM REPORT   Patient Name:   Heather [name] Date of Exam: 10/10/2020 Medical Rec #:  [id number]              Height:       63.0 in Accession #:    [id number]             Weight:       188.5 lb Date of Birth:  09-Feb-1946             BSA:          1.886 m Patient Age:    75 years               BP:           140/71 mmHg Patient Gender: F                      HR:           93 bpm. Exam Location:  Inpatient Procedure: 2D Echo Indications:    elevated troponin  History:        Patient has no prior history of Echocardiogram examinations.                 Signs/Symptoms:elevated troponin; Risk Factors:Dyslipidemia and                 Hypertension.  Sonographer:    Delcie Roch RDCS Referring Phys: [id number] ANASTASSIA DOUTOVA IMPRESSIONS  1. Mild mid-apical anterolateral and posterolateral hypokinesis. Left ventricular ejection fraction, by estimation, is 50 to 55%. The left ventricle has low normal function. The left ventricle demonstrates regional wall motion abnormalities (see scoring  diagram/findings for description). There is  mild concentric  left ventricular hypertrophy. Left ventricular diastolic parameters are consistent with Grade II diastolic dysfunction (pseudonormalization). Elevated left ventricular end-diastolic pressure.  2. Right ventricular systolic function is normal. The right ventricular size is normal. There is mildly elevated pulmonary artery systolic pressure.  3. Left atrial size was mildly dilated.  4. The mitral valve is normal in structure. No evidence of mitral valve regurgitation. No evidence of mitral stenosis.  5. The aortic valve is tricuspid. Aortic valve regurgitation is not visualized. No aortic stenosis is present.  6. The inferior vena cava is normal in size with greater than 50% respiratory variability, suggesting right atrial pressure of 3 mmHg. FINDINGS  Left Ventricle: Mild mid-apical anterolateral and posterolateral hypokinesis. Left ventricular ejection fraction, by estimation, is 50 to 55%. The left ventricle has low normal function. The left ventricle demonstrates regional wall motion abnormalities. The left ventricular internal cavity size was normal in size. There is mild concentric left ventricular hypertrophy. Left ventricular diastolic parameters are consistent with Grade II diastolic dysfunction (pseudonormalization). Elevated left ventricular end-diastolic pressure. Right Ventricle: The right ventricular size is normal. No increase in right ventricular wall thickness. Right ventricular systolic function is normal. There is mildly elevated pulmonary artery systolic pressure. The tricuspid regurgitant velocity is 3.19  m/s, and with an assumed right atrial pressure of 3 mmHg, the estimated right ventricular systolic pressure is 43.7 mmHg. Left Atrium: Left atrial size was mildly dilated. Right Atrium: Right atrial size was normal in size. Pericardium: There is no evidence of pericardial effusion. Mitral Valve: The mitral valve is normal in structure. No evidence of mitral valve regurgitation.  No evidence of mitral valve stenosis. Tricuspid Valve: The tricuspid valve is normal in structure. Tricuspid valve regurgitation is trivial. No evidence of tricuspid stenosis. Aortic Valve: The aortic valve is tricuspid. Aortic valve regurgitation is not visualized. No aortic stenosis is present. Pulmonic Valve: The pulmonic valve was normal in structure. Pulmonic valve regurgitation is mild. No evidence of pulmonic stenosis. Aorta: The aortic root is normal in size and structure. Venous: The inferior vena cava is normal in size with greater than 50% respiratory variability, suggesting right atrial pressure of 3 mmHg. IAS/Shunts: No atrial level shunt detected by color flow Doppler.  LEFT VENTRICLE PLAX 2D LVIDd:         4.10 cm     Diastology LVIDs:         2.40 cm     LV e' medial:    6.53 cm/s LV PW:         1.20 cm     LV E/e' medial:  14.1 LV IVS:        1.06 cm     LV e' lateral:   9.36 cm/s LVOT diam:     1.80 cm     LV E/e' lateral: 9.8 LV SV:         43 LV SV Index:   23 LVOT Area:     2.54 cm  LV Volumes (MOD) LV vol d, MOD A4C: 71.5 ml LV vol s, MOD A4C: 35.7 ml LV SV MOD A4C:     71.5 ml RIGHT VENTRICLE             IVC RV S prime:     11.20 cm/s  IVC diam: 1.70 cm TAPSE (M-mode): 2.0 cm LEFT ATRIUM             Index       RIGHT ATRIUM             Index LA diam:        4.00 cm 2.12 cm/m  RA Area:     11.90 cm LA Vol (A2C):   75.3 ml 39.94 ml/m RA Volume:   27.30 ml  14.48 ml/m LA Vol (A4C):   45.0 ml 23.87 ml/m LA Biplane Vol: 59.3 ml 31.45 ml/m  AORTIC VALVE LVOT Vmax:   108.00 cm/s LVOT Vmean:  69.500 cm/s LVOT VTI:    0.170 m  AORTA Ao Root diam: 2.80 cm Ao Asc diam:  3.10 cm MITRAL VALVE               TRICUSPID VALVE MV Area (PHT): 4.80 cm    TR Peak grad:   40.7 mmHg MV Decel Time: 158 msec    TR Vmax:        319.00 cm/s MV E velocity: 92.10 cm/s MV A velocity: 84.80 cm/s  SHUNTS MV E/A ratio:  1.09        Systemic VTI:  0.17 m                            Systemic Diam: 1.80 cm Chilton Si  MD Electronically signed by Chilton Si MD Signature Date/Time: 10/10/2020/6:46:32 PM    Final     Cardiac Studies   2D echo 10/10/2020 IMPRESSIONS    1. Mild mid-apical anterolateral and posterolateral hypokinesis. Left  ventricular ejection fraction, by estimation, is 50 to 55%. The left  ventricle has low normal function. The left ventricle demonstrates  regional wall motion abnormalities (see scoring   diagram/findings for description). There is mild concentric left  ventricular hypertrophy. Left ventricular diastolic parameters are  consistent with Grade II diastolic dysfunction (pseudonormalization).  Elevated left ventricular end-diastolic pressure.   2. Right ventricular systolic function is normal. The right ventricular  size is normal. There is mildly elevated pulmonary artery systolic  pressure.   3. Left atrial size was mildly dilated.   4. The mitral valve is normal in structure. No evidence of mitral valve  regurgitation. No evidence of mitral stenosis.   5. The aortic valve is tricuspid. Aortic valve regurgitation is not  visualized. No aortic stenosis is present.   6. The inferior vena cava is normal in size with greater than 50%  respiratory variability, suggesting right atrial pressure of 3 mmHg.   Patient Profile     75 y.o. female with a hx of hypertension, asthma, bilateral knee replacement, crest syndrome and hyperlipidemia who is being seen 10/10/2020 for the evaluation of elevated troponin at the request of Dr. Jacqulyn Bath.  Assessment & Plan    Chest pain with elevated troponin / NSTEMI -No issue prior to this past weekend.   -developed N/V/diarrhea and then chest pain -EKG with inferior lateral T wave inversion concerning for ischemia.EKG much worse than prior EKGs -Hs-troponin 555>>316>>295>>294.  -no PE on VQ scan -2D echo showed E 50-55% with mild mid-apical anterolateral and posterolateral HK -patient needs cath prior to undergoing knee surgery -she  is NPO -will plan LHC with possible PCI today -Shared Decision Making/Informed Consent{ The risks [stroke (1 in 1000), death (1 in 1000), kidney failure [usually temporary] (1 in 500), bleeding (1 in 200), allergic reaction [possibly serious] (1 in 200)], benefits (diagnostic support and management of coronary artery disease) and alternatives of a cardiac catheterization were discussed in detail with Ms. Darrington and she is willing to proceed.  -Patient is on heparin for anticoagulation currently.   -  start ASA  daily and Lopressor  BID -increase Crestor to  daily  2.  LV dysfunction -likely ischemic related based on presentation and elevated Trop -cath today -start Lopressor  BID (SVT suppression ) and consolidate to Toprol at discharge -restart ARB home dose if BP remains stable on BB   2.  AKI -Likely due to poor p.o. intake secondary to nausea vomiting diarrhea -Kidney function improved to 0.86 with hydration   3.  Hypertension -Home antihypertensive on hold due to soft blood pressure and AKI -BP much improved this am -start Lopressor  BID for BP and SVT suppression -resume ARB if BP stable after addition of BB  4.  Prolonged QT -Improved to QT/QTcB 449/562 ms -Avoid QT prolonging agent   5.  Femur Fx related to mechanical fall -distal femur fracture in a patient with a total knee arthroplasty.  -This requires operative fixation per ortho  I have spent a total of 35 minutes with patient reviewing 2D echo , telemetry, EKGs, labs and examining patient as well as establishing an assessment and plan that was discussed with the patient.  > 50% of time was spent in direct patient care.     For questions or updates, please contact [facility] Please consult [email or web address] for contact info under        Signed, Armanda Magic, MD  10/11/2020, 8:34 AM

## 2020-10-11 NOTE — Interval H&P Note (Signed)
History and Physical Interval Note:  10/11/2020 12:25 PM  Heather [name]  has presented today for surgery, with the diagnosis of nstemi.  The various methods of treatment have been discussed with the patient and family. After consideration of risks, benefits and other options for treatment, the patient has consented to  Procedure(s): LEFT HEART CATH AND CORONARY ANGIOGRAPHY (N/A) as a surgical intervention.  The patient's history has been reviewed, patient examined, no change in status, stable for surgery.  I have reviewed the patient's chart and labs.  Questions were answered to the patient's satisfaction.   Cath Lab Visit (complete for each Cath Lab visit)  Clinical Evaluation Leading to the Procedure:   ACS: Yes.    Non-ACS:    Anginal Classification: CCS III  Anti-ischemic medical therapy: No Therapy  Non-Invasive Test Results: No non-invasive testing performed  Prior CABG: No previous CABG        Theron Arista [facility] 10/11/2020 12:25 PM

## 2020-10-11 NOTE — Progress Notes (Addendum)
Progress Note    Heather [name]  YNW:[id number] DOB: Jul 26, 1945  DOA: 10/09/2020 PCP: Janece Canterbury, MD      Brief Narrative:    Medical records reviewed and are as summarized below:  Heather [name] is a 75 y.o. female with medical history significant for depression, hyperlipidemia, hypertension, asthma, L3 burst fracture s/p repair, who was brought to the hospital after a fall at home.  She was diagnosed with a distal femoral fracture.  She was also found to have AKI, hyperkalemia, elevated troponins concerning for NSTEMI.  She was treated with IV fluids and analgesics.  She underwent left heart cath which showed minimal nonobstructive CAD.      Assessment/Plan:   Active Problems:   Essential hypertension   Hypotension   Femur fracture (HCC)   Prolonged QT interval   Abnormal ECG   AKI (acute kidney injury) (HCC)   Hyperkalemia   Elevated troponin   Leukocytosis    Body mass index is 20.71 kg/m.   Left distal femur fracture: Continue analgesics as needed.  Plan for surgical intervention tomorrow.  Follow-up with orthopedics.  Chest pain with elevated troponins: Troponin peaked at 555.  Left heart cath showed nonobstructive CAD.  2D echo showed EF estimated at 50 to 55%, LVH, grade 2 diastolic dysfunction.  No evidence of PE on VQ scan.  AKI with hyperkalemia: Improved.  Valsartan has been held.  Hypocalcemia and hypophosphatemia: Calcium was 6.6 and phosphorus was 1.4.  Replete calcium and phosphorus with IV calcium gluconate and IV sodium phosphate respectively.  Monitor levels.  Prolonged QTc interval: Improved from 639-508.  Monitor QTc interval after correction of abnormal electrolytes.  Trazodone, escitalopram and Zofran have been held.   Thrombocytopenia, likely acute: Platelet count was 151 on 05/15/2020.  Platelets down to 119.  Monitor platelet levels.    Diet Order             Diet NPO time specified  Diet effective now                       Consultants: Cardiologist Orthopedic surgeon  Procedures: Left heart cath on 10/11/2020    Medications:    buPROPion  300 mg Oral Daily   calcium carbonate  1 tablet Oral BID WC   Chlorhexidine Gluconate Cloth  6 each Topical Daily   feeding supplement  237 mL Oral TID BM   gabapentin  300 mg Oral Daily   And   gabapentin  600 mg Oral QHS   heparin  5,000 Units Subcutaneous Q8H   [START ON 10/12/2020] multivitamin with minerals  1 tablet Oral Daily   rosuvastatin  10 mg Oral Daily   sodium chloride flush  3 mL Intravenous Q12H   sodium chloride flush  3 mL Intravenous Q12H   Continuous Infusions:  sodium chloride     sodium chloride     sodium chloride     methocarbamol (ROBAXIN) IV     sodium phosphate  Dextrose 5% IVPB       Anti-infectives (From admission, onward)    Start     Dose/Rate Route Frequency Ordered Stop   10/12/20 0100  vancomycin (VANCOREADY) IVPB 750 mg/150 mL  Status:  Discontinued        750 mg 150 mL/hr over 60 Minutes Intravenous Every 48 hours 10/09/20 2343 10/10/20 0958   10/10/20 0000  vancomycin (VANCOREADY) IVPB 1750 mg/350 mL        1,750  mg 175 mL/hr over 120 Minutes Intravenous  Once 10/09/20 2302 10/10/20 0308   10/09/20 2315  ceFEPIme (MAXIPIME) 2 g in sodium chloride 0.9 % 100 mL IVPB        2 g 200 mL/hr over 30 Minutes Intravenous  Once 10/09/20 2302 10/10/20 0026   10/09/20 2300  metroNIDAZOLE (FLAGYL) IVPB 500 mg  Status:  Discontinued        500 mg 100 mL/hr over 60 Minutes Intravenous Every 12 hours 10/09/20 2251 10/10/20 0958   10/09/20 2200  ceFEPIme (MAXIPIME) 2 g in sodium chloride 0.9 % 100 mL IVPB  Status:  Discontinued        2 g 200 mL/hr over 30 Minutes Intravenous Every 24 hours 10/09/20 2343 10/10/20 0958              Family Communication/Anticipated D/C date and plan/Code Status   DVT prophylaxis: heparin injection 5,000 Units Start: 10/11/20 2200 SCDs Start: 10/10/20 0544      Code Status: Full Code  Family Communication: None Disposition Plan:    Status is: Inpatient  Remains inpatient appropriate because:Inpatient level of care appropriate due to severity of illness  Dispo: The patient is from: Home              Anticipated d/c is to: SNF              Patient currently is not medically stable to d/c.   Difficult to place patient No           Subjective:   C/o pain in the left thigh.  No chest pain or shortness of breath.  Her nurse was at the bedside.  Objective:    Vitals:   10/11/20 1326 10/11/20 1331 10/11/20 1336 10/11/20 1343  BP:    (!) 142/87  Pulse: (!) 0 (!) 121 (!) 191 (!) 101  Resp: (!) 8 (!) 0 (!) 0 (!) 22  Temp:    98.9 F (37.2 C)  TempSrc:    Oral  SpO2: (!) 0% (!) 0% (!) 0% 93%  Weight:      Height:       No data found.   Intake/Output Summary (Last 24 hours) at 10/11/2020 1412 Last data filed at 10/11/2020 1045 Gross per 24 hour  Intake 1066.58 ml  Output 2075 ml  Net -1008.42 ml   Filed Weights   10/10/20 2000  Weight: 58.2 kg    Exam:  GEN: NAD SKIN: No rash EYES: EOMI ENT: MMM CV: Regular rate and rhythm, tachycardic PULM: CTA B ABD: soft, ND, NT, +BS CNS: AAO x 3, non focal EXT: Left thigh tenderness        Data Reviewed:   I have personally reviewed following labs and imaging studies:  Labs: Labs show the following:   Basic Metabolic Panel: Recent Labs  Lab 10/09/20 1840 10/10/20 0000 10/10/20 0540 10/11/20 0037  NA 137 141 139 138  K 5.7* 4.1 4.0 3.7  CL 111 116* 115* 114*  CO2 18* 19* 18* 19*  GLUCOSE 105* 73 84 100*  BUN 30* 24* 20 10  CREATININE 2.21* 1.40* 1.10* 0.86  CALCIUM 6.6* 6.1* 6.6* 6.6*  MG  --  1.7  --   --   PHOS  --  2.5  --  1.4*   GFR Estimated Creatinine Clearance: 53.5 mL/min (by C-G formula based on SCr of 0.86 mg/dL). Liver Function Tests: Recent Labs  Lab 10/09/20 1840 10/10/20 0540 10/11/20 0037  AST 30 15  --  ALT 13 12  --    ALKPHOS 54 44  --   BILITOT 1.3* 0.6  --   PROT 5.7* 4.7*  --   ALBUMIN 3.0* 2.5* 2.2*   Recent Labs  Lab 10/10/20 0000  LIPASE 28   No results for input(s): AMMONIA in the last 168 hours. Coagulation profile Recent Labs  Lab 10/09/20 2317  INR 1.5*    CBC: Recent Labs  Lab 10/09/20 1840 10/10/20 0639 10/11/20 0037  WBC 18.6* 13.1* 12.4*  NEUTROABS 13.7* 10.3*  --   HGB 13.1 9.4* 9.9*  HCT 42.4 30.2* 30.2*  MCV 87.2 86.8 83.9  PLT 149* 116* 119*   Cardiac Enzymes: Recent Labs  Lab 10/10/20 0000  CKTOTAL 75   BNP (last 3 results) No results for input(s): PROBNP in the last 8760 hours. CBG: No results for input(s): GLUCAP in the last 168 hours. D-Dimer: Recent Labs    10/10/20 0000  DDIMER 5.13*   Hgb A1c: No results for input(s): HGBA1C in the last 72 hours. Lipid Profile: No results for input(s): CHOL, HDL, LDLCALC, TRIG, CHOLHDL, LDLDIRECT in the last 72 hours. Thyroid function studies: Recent Labs    10/09/20 2321  TSH 3.324   Anemia work up: No results for input(s): VITAMINB12, FOLATE, FERRITIN, TIBC, IRON, RETICCTPCT in the last 72 hours. Sepsis Labs: Recent Labs  Lab 10/09/20 1840 10/09/20 2000 10/09/20 2056 10/09/20 2317 10/10/20 0110 10/10/20 0639 10/11/20 0037  PROCALCITON  --   --  0.14  --  <0.10  --  0.10  WBC 18.6*  --   --   --   --  13.1* 12.4*  LATICACIDVEN  --  1.0  --  0.9  --   --   --     Microbiology Recent Results (from the past 240 hour(s))  Resp Panel by RT-PCR (Flu A&B, Covid) Nasopharyngeal Swab     Status: None   Collection Time: 10/09/20  6:41 PM   Specimen: Nasopharyngeal Swab; Nasopharyngeal(NP) swabs in vial transport medium  Result Value Ref Range Status   SARS Coronavirus 2 by RT PCR NEGATIVE NEGATIVE Final    Comment: (NOTE) SARS-CoV-2 target nucleic acids are NOT DETECTED.  The SARS-CoV-2 RNA is generally detectable in upper respiratory specimens during the acute phase of infection. The  lowest concentration of SARS-CoV-2 viral copies this assay can detect is 138 copies/mL. A negative result does not preclude SARS-Cov-2 infection and should not be used as the sole basis for treatment or other patient management decisions. A negative result may occur with  improper specimen collection/handling, submission of specimen other than nasopharyngeal swab, presence of viral mutation(s) within the areas targeted by this assay, and inadequate number of viral copies(<138 copies/mL). A negative result must be combined with clinical observations, patient history, and epidemiological information. The expected result is Negative.  Fact Sheet for Patients:  BloggerCourse.com  Fact Sheet for Healthcare Providers:  SeriousBroker.it  This test is no t yet approved or cleared by the [place] FDA and  has been authorized for detection and/or diagnosis of SARS-CoV-2 by FDA under an Emergency Use Authorization (EUA). This EUA will remain  in effect (meaning this test can be used) for the duration of the COVID-19 declaration under Section 564(b)(1) of the Act, 21 U.S.C.section 360bbb-3(b)(1), unless the authorization is terminated  or revoked sooner.       Influenza A by PCR NEGATIVE NEGATIVE Final   Influenza B by PCR NEGATIVE NEGATIVE Final    Comment: (NOTE)  The Xpert Xpress SARS-CoV-2/FLU/RSV plus assay is intended as an aid in the diagnosis of influenza from Nasopharyngeal swab specimens and should not be used as a sole basis for treatment. Nasal washings and aspirates are unacceptable for Xpert Xpress SARS-CoV-2/FLU/RSV testing.  Fact Sheet for Patients: BloggerCourse.com  Fact Sheet for Healthcare Providers: SeriousBroker.it  This test is not yet approved or cleared by the [place] FDA and has been authorized for detection and/or diagnosis of SARS-CoV-2 by FDA under  an Emergency Use Authorization (EUA). This EUA will remain in effect (meaning this test can be used) for the duration of the COVID-19 declaration under Section 564(b)(1) of the Act, 21 U.S.C. section 360bbb-3(b)(1), unless the authorization is terminated or revoked.  Performed at [facility], [address] [place], [place] [address]   Culture, blood (routine x 2)     Status: None (Preliminary result)   Collection Time: 10/09/20  7:55 PM   Specimen: BLOOD  Result Value Ref Range Status   Specimen Description   Final    BLOOD RIGHT ANTECUBITAL Performed at [facility], [address], [place], [place] [address]    Special Requests   Final    BOTTLES DRAWN AEROBIC AND ANAEROBIC Blood Culture results may not be optimal due to an inadequate volume of blood received in culture bottles Performed at [facility], [address] [place], [place] [address]    Culture   Final    NO GROWTH 1 DAY Performed at [facility], [address] [place], [place] [address]    Report Status PENDING  Incomplete  Culture, blood (routine x 2)     Status: Abnormal (Preliminary result)   Collection Time: 10/09/20  7:55 PM   Specimen: BLOOD  Result Value Ref Range Status   Specimen Description   Final    BLOOD LEFT ANTECUBITAL Performed at [facility], [address] [place], [place] [address]    Special Requests   Final    BOTTLES DRAWN AEROBIC AND ANAEROBIC Blood Culture results may not be optimal due to an inadequate volume of blood received in culture bottles Performed at [facility], [address] [place], [place] [address]    Culture  Setup Time   Final    GRAM POSITIVE COCCI IN CLUSTERS IN BOTH AEROBIC AND ANAEROBIC BOTTLES CRITICAL RESULT CALLED TO, READ BACK BY AND VERIFIED WITH: L SEAY,PHARMD@2330  10/10/20 MK    Culture (A)  Final    STAPHYLOCOCCUS EPIDERMIDIS THE SIGNIFICANCE OF  ISOLATING THIS ORGANISM FROM A SINGLE SET OF BLOOD CULTURES WHEN MULTIPLE SETS ARE DRAWN IS UNCERTAIN. PLEASE NOTIFY THE MICROBIOLOGY DEPARTMENT WITHIN ONE WEEK IF SPECIATION AND SENSITIVITIES ARE REQUIRED. Performed at [facility], [address], [place], [place] [address]    Report Status PENDING  Incomplete  Blood Culture ID Panel (Reflexed)     Status: Abnormal   Collection Time: 10/09/20  7:55 PM  Result Value Ref Range Status   Enterococcus faecalis NOT DETECTED NOT DETECTED Final   Enterococcus Faecium NOT DETECTED NOT DETECTED Final   Listeria monocytogenes NOT DETECTED NOT DETECTED Final   Staphylococcus species DETECTED (A) NOT DETECTED Final    Comment: CRITICAL RESULT CALLED TO, READ BACK BY AND VERIFIED WITH: L SEAY,PHARMD@2330  10/10/20 MK    Staphylococcus aureus (BCID) NOT DETECTED NOT DETECTED Final   Staphylococcus epidermidis DETECTED (A) NOT DETECTED Final    Comment: Methicillin (oxacillin) resistant coagulase negative staphylococcus.  Possible blood culture contaminant (unless isolated from more than one blood culture draw or clinical case suggests pathogenicity). No antibiotic treatment is indicated for blood  culture contaminants. CRITICAL RESULT CALLED TO, READ BACK BY AND VERIFIED WITH: L SEAY,PHARMD@2330  10/10/20 MK    Staphylococcus lugdunensis NOT DETECTED NOT DETECTED Final   Streptococcus species NOT DETECTED NOT DETECTED Final   Streptococcus agalactiae NOT DETECTED NOT DETECTED Final   Streptococcus pneumoniae NOT DETECTED NOT DETECTED Final   Streptococcus pyogenes NOT DETECTED NOT DETECTED Final   A.calcoaceticus-baumannii NOT DETECTED NOT DETECTED Final   Bacteroides fragilis NOT DETECTED NOT DETECTED Final   Enterobacterales NOT DETECTED NOT DETECTED Final   Enterobacter cloacae complex NOT DETECTED NOT DETECTED Final   Escherichia coli NOT DETECTED NOT DETECTED Final   Klebsiella aerogenes NOT DETECTED NOT DETECTED Final   Klebsiella oxytoca  NOT DETECTED NOT DETECTED Final   Klebsiella pneumoniae NOT DETECTED NOT DETECTED Final   Proteus species NOT DETECTED NOT DETECTED Final   Salmonella species NOT DETECTED NOT DETECTED Final   Serratia marcescens NOT DETECTED NOT DETECTED Final   Haemophilus influenzae NOT DETECTED NOT DETECTED Final   Neisseria meningitidis NOT DETECTED NOT DETECTED Final   Pseudomonas aeruginosa NOT DETECTED NOT DETECTED Final   Stenotrophomonas maltophilia NOT DETECTED NOT DETECTED Final   Candida albicans NOT DETECTED NOT DETECTED Final   Candida auris NOT DETECTED NOT DETECTED Final   Candida glabrata NOT DETECTED NOT DETECTED Final   Candida krusei NOT DETECTED NOT DETECTED Final   Candida parapsilosis NOT DETECTED NOT DETECTED Final   Candida tropicalis NOT DETECTED NOT DETECTED Final   Cryptococcus neoformans/gattii NOT DETECTED NOT DETECTED Final   Methicillin resistance mecA/C DETECTED (A) NOT DETECTED Final    Comment: CRITICAL RESULT CALLED TO, READ BACK BY AND VERIFIED WITH: L SEAY,PHARMD @2330  10/10/20 MK Performed at [facility], [address], [place], [place]   MRSA Next Gen by PCR, Nasal     Status: None   Collection Time: 10/10/20  4:17 PM   Specimen: Nasal Mucosa; Nasal Swab  Result Value Ref Range Status   MRSA by PCR Next Gen NOT DETECTED NOT DETECTED Final    Comment: (NOTE) The GeneXpert MRSA Assay (FDA approved for NASAL specimens only), is one component of a comprehensive MRSA colonization surveillance program. It is not intended to diagnose MRSA infection nor to guide or monitor treatment for MRSA infections. Test performance is not FDA approved in patients less than 61 years old. Performed at [facility], [address] [place], [place]     Procedures and diagnostic studies:  NM Pulmonary Perfusion  Result Date: 10/10/2020 CLINICAL DATA:  Pulmonary emboli suspected, high probability. Shortness of breath over the last 3 days.  EXAM: NUCLEAR MEDICINE PERFUSION LUNG SCAN TECHNIQUE: Perfusion images were obtained in multiple projections after intravenous injection of radiopharmaceutical. Ventilation scans intentionally deferred if perfusion scan and chest x-ray adequate for interpretation during COVID 19 epidemic. RADIOPHARMACEUTICALS:  4.4 mCi Tc-91m MAA IV COMPARISON:  Chest radiography yesterday. FINDINGS: Negative perfusion study.  No evidence of focal defect. IMPRESSION: Negative/normal nuclear medicine pulmonary perfusion study. No visible defect. Electronically Signed   By: 84m M.D.   On: 10/10/2020 11:43   CARDIAC CATHETERIZATION  Result Date: 10/11/2020   Prox LAD lesion is 30% stenosed.   Prox RCA to Mid RCA lesion is 20% stenosed.   LV end diastolic pressure is normal. Minimal nonobstructive CAD Normal LVEDP Plan: medical  management.   DG Chest Port 1 View  Result Date: 10/09/2020 CLINICAL DATA:  Weakness.  Patient fell x2 today. EXAM: PORTABLE CHEST 1 VIEW COMPARISON:  12/29/2019 FINDINGS: Heart size is accentuated by AP portable technique. The lungs are clear. No pneumothorax. No consolidation or pulmonary edema. Remote RIGHT shoulder arthroplasty. IMPRESSION: No evidence for acute  abnormality. Electronically Signed   By: Norva Pavlov M.D.   On: 10/09/2020 19:39   DG Knee Left Port  Result Date: 10/09/2020 CLINICAL DATA:  Fall, left knee pain EXAM: PORTABLE LEFT KNEE - 1-2 VIEW COMPARISON:  None. FINDINGS: Two view radiograph left knee demonstrates a a comminuted fracture of the distal left femoral diaphysis extending to the level of the femoral arthroplasty component with prominent medial and posterior butterfly components. There is mild medial and moderate posterior angulation of the distal fracture fragment. 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 Arthroplasty components are in expected alignment. Small left knee effusion is present. IMPRESSION: Comminuted, angulated fracture of the distal  left femur just above the femoral arthroplasty component. Electronically Signed   By: Helyn Numbers M.D.   On: 10/09/2020 19:04   ECHOCARDIOGRAM COMPLETE  Result Date: 10/10/2020    ECHOCARDIOGRAM REPORT   Patient Name:   Heather [name] Date of Exam: 10/10/2020 Medical Rec #:  [id number]              Height:       63.0 in Accession #:    [id number]             Weight:       188.5 lb Date of Birth:  [birth year]             BSA:          1.886 m Patient Age:    74 years               BP:           140/71 mmHg Patient Gender: F                      HR:           93 bpm. Exam Location:  Inpatient Procedure: 2D Echo Indications:    elevated troponin  History:        Patient has no prior history of Echocardiogram examinations.                 Signs/Symptoms:elevated troponin; Risk Factors:Dyslipidemia and                 Hypertension.  Sonographer:    Delcie Roch RDCS Referring Phys: [id number] ANASTASSIA DOUTOVA IMPRESSIONS  1. Mild mid-apical anterolateral and posterolateral hypokinesis. Left ventricular ejection fraction, by estimation, is 50 to 55%. The left ventricle has low normal function. The left ventricle demonstrates regional wall motion abnormalities (see scoring  diagram/findings for description). There is mild concentric left ventricular hypertrophy. Left ventricular diastolic parameters are consistent with Grade II diastolic dysfunction (pseudonormalization). Elevated left ventricular end-diastolic pressure.  2. Right ventricular systolic function is normal. The right ventricular size is normal. There is mildly elevated pulmonary artery systolic pressure.  3. Left atrial size was mildly dilated.  4. The mitral valve is normal in structure. No evidence of mitral valve regurgitation. No evidence of mitral stenosis.  5. The aortic valve is tricuspid. Aortic valve regurgitation is not visualized. No aortic stenosis is present.  6. The inferior vena cava is normal in size with  greater than 50%  respiratory variability, suggesting right atrial pressure of 3 mmHg. FINDINGS  Left Ventricle: Mild mid-apical anterolateral and posterolateral hypokinesis. Left ventricular ejection fraction, by estimation, is 50 to 55%. The left ventricle has low normal function. The left ventricle demonstrates regional wall motion abnormalities. The left ventricular internal cavity size was normal in size. There is mild concentric left ventricular hypertrophy. Left ventricular diastolic parameters are consistent with Grade II diastolic dysfunction (pseudonormalization). Elevated left ventricular end-diastolic pressure. Right Ventricle: The right ventricular size is normal. No increase in right ventricular wall thickness. Right ventricular systolic function is normal. There is mildly elevated pulmonary artery systolic pressure. The tricuspid regurgitant velocity is 3.19  m/s, and with an assumed right atrial pressure of 3 mmHg, the estimated right ventricular systolic pressure is 43.7 mmHg. Left Atrium: Left atrial size was mildly dilated. Right Atrium: Right atrial size was normal in size. Pericardium: There is no evidence of pericardial effusion. Mitral Valve: The mitral valve is normal in structure. No evidence of mitral valve regurgitation. No evidence of mitral valve stenosis. Tricuspid Valve: The tricuspid valve is normal in structure. Tricuspid valve regurgitation is trivial. No evidence of tricuspid stenosis. Aortic Valve: The aortic valve is tricuspid. Aortic valve regurgitation is not visualized. No aortic stenosis is present. Pulmonic Valve: The pulmonic valve was normal in structure. Pulmonic valve regurgitation is mild. No evidence of pulmonic stenosis. Aorta: The aortic root is normal in size and structure. Venous: The inferior vena cava is normal in size with greater than 50% respiratory variability, suggesting right atrial pressure of 3 mmHg. IAS/Shunts: No atrial level shunt detected by color flow Doppler.  LEFT  VENTRICLE PLAX 2D LVIDd:         4.10 cm     Diastology LVIDs:         2.40 cm     LV e' medial:    6.53 cm/s LV PW:         1.20 cm     LV E/e' medial:  14.1 LV IVS:        1.06 cm     LV e' lateral:   9.36 cm/s LVOT diam:     1.80 cm     LV E/e' lateral: 9.8 LV SV:         43 LV SV Index:   23 LVOT Area:     2.54 cm  LV Volumes (MOD) LV vol d, MOD A4C: 71.5 ml LV vol s, MOD A4C: 35.7 ml LV SV MOD A4C:     71.5 ml RIGHT VENTRICLE             IVC RV S prime:     11.20 cm/s  IVC diam: 1.70 cm TAPSE (M-mode): 2.0 cm LEFT ATRIUM             Index       RIGHT ATRIUM           Index LA diam:        4.00 cm 2.12 cm/m  RA Area:     11.90 cm LA Vol (A2C):   75.3 ml 39.94 ml/m RA Volume:   27.30 ml  14.48 ml/m LA Vol (A4C):   45.0 ml 23.87 ml/m LA Biplane Vol: 59.3 ml 31.45 ml/m  AORTIC VALVE LVOT Vmax:   108.00 cm/s LVOT Vmean:  69.500 cm/s LVOT VTI:    0.170 m  AORTA Ao Root diam: 2.80 cm Ao Asc diam:  3.10 cm MITRAL VALVE  TRICUSPID VALVE MV Area (PHT): 4.80 cm    TR Peak grad:   40.7 mmHg MV Decel Time: 158 msec    TR Vmax:        319.00 cm/s MV E velocity: 92.10 cm/s MV A velocity: 84.80 cm/s  SHUNTS MV E/A ratio:  1.09        Systemic VTI:  0.17 m                            Systemic Diam: 1.80 cm Chilton Si MD Electronically signed by Chilton Si MD Signature Date/Time: 10/10/2020/6:46:32 PM    Final                LOS: 2 days   Angelito Hopping  [facility]   Pager on [email or web address]. If 7PM-7AM, please contact night-coverage at [email or web address]     10/11/2020, 2:12 PM

## 2020-10-11 NOTE — Progress Notes (Signed)
[facility] night shift PCU coverage note.  The nursing staff reported that the patient was having tachycardia ranging from 100 to 130 bpm.  Her most recent BP was 101/57 with a MAP of 71 mmHg but during dayshift SBP was at 1 point in the 70s.  He has improved with IV hydration and her AKI has almost resolved.  She is asymptomatic at this time.  Her tachycardia improved yesterday evening after I ordered metoprolol 2.5 mg IVP x1 and magnesium sulfate 2 g IVPB to optimize a 1.7 mg/dL level. This morning I have octane an EKG which showed junctional tachycardia at 128 bpm with retrograde conduction.  EKG Vent. rate 128 BPM PR interval * ms QRS duration 90 ms QT/QTcB 348/508 ms P-R-T axes * -37 190 Accelerated Junctional rhythm with retrograde conduction Left axis deviation ST & T wave abnormality, consider inferior ischemia ST & T wave abnormality, consider anterolateral ischemia Abnormal ECG  Assessment:  Asymptomatic junctional tachycardia with retrograde conduction. On heparin due to high suspicion for acute coronary syndrome. Hypoalbuminemia with severe protein malnutrition. I have ordered earlier an LR bolus of 500 mL and will add: Second 500 mL LR bolus. Repeat metoprolol 2.5 mg IVP x1. Albumin 50 g IVPB x1. Add on troponin level. Add-on renal function panel. We will continue to monitor.  Sanda Klein, MD.

## 2020-10-11 NOTE — H&P (View-Only) (Signed)
Progress Note  Patient Name: Heather [name] Date of Encounter: 10/11/2020  [facility] Cardiologist: None   Subjective   Had an episode of SVT last night.  Denies any chest pain or SOB  Inpatient Medications    Scheduled Meds:  buPROPion  300 mg Oral Daily   Chlorhexidine Gluconate Cloth  6 each Topical Daily   gabapentin  300 mg Oral Daily   And   gabapentin  600 mg Oral QHS   rosuvastatin  10 mg Oral Daily   Continuous Infusions:  heparin 1,600 Units/hr (10/11/20 0750)   methocarbamol (ROBAXIN) IV     PRN Meds: albuterol, HYDROcodone-acetaminophen, methocarbamol **OR** methocarbamol (ROBAXIN) IV   Vital Signs    Vitals:   10/11/20 0500 10/11/20 0600 10/11/20 0645 10/11/20 0714  BP:   118/71 132/75  Pulse: 97 96 (!) 10 100  Resp:  20 20 (!) 23  Temp:   99.1 F (37.3 C) 99.3 F (37.4 C)  TempSrc:   Oral Oral  SpO2: 93% 92% 92% 92%  Weight:      Height:        Intake/Output Summary (Last 24 hours) at 10/11/2020 0834 Last data filed at 10/11/2020 0807 Gross per 24 hour  Intake 1066.58 ml  Output 1825 ml  Net -758.42 ml   Last 3 Weights 10/10/2020 05/14/2020 12/29/2019  Weight (lbs) 128 lb 4.9 oz 188 lb 7.9 oz 188 lb 6.4 oz  Weight (kg) 58.2 kg 85.5 kg 85.458 kg      Telemetry    NSR with run of SVT - Personally Reviewed  ECG    SVT with inferolateral ST abnormality - Personally Reviewed  Physical Exam   GEN: No acute distress.   Neck: No JVD Cardiac: RRR, no murmurs, rubs, or gallops.  Respiratory: Clear to auscultation bilaterally. GI: Soft, nontender, non-distended  MS: No edema; No deformity. Neuro:  Nonfocal  Psych: Normal affect   Labs    High Sensitivity Troponin:   Recent Labs  Lab 10/09/20 2056 10/09/20 2317 10/10/20 0100 10/10/20 0540 10/11/20 0037  TROPONINIHS 555* 316* 295* 294* 239*      Chemistry Recent Labs  Lab 10/09/20 1840 10/10/20 0000 10/10/20 0540 10/11/20 0037  NA 137 141 139 138  K 5.7* 4.1  4.0 3.7  CL 111 116* 115* 114*  CO2 18* 19* 18* 19*  GLUCOSE 105* 73 84 100*  BUN 30* 24* 20 10  CREATININE 2.21* 1.40* 1.10* 0.86  CALCIUM 6.6* 6.1* 6.6* 6.6*  PROT 5.7*  --  4.7*  --   ALBUMIN 3.0*  --  2.5* 2.2*  AST 30  --  15  --   ALT 13  --  12  --   ALKPHOS 54  --  44  --   BILITOT 1.3*  --  0.6  --   GFRNONAA 23* 40* 53* >60  ANIONGAP 8 6 6 5      Hematology Recent Labs  Lab 10/09/20 1840 10/10/20 0639 10/11/20 0037  WBC 18.6* 13.1* 12.4*  RBC 4.86 3.48* 3.60*  HGB 13.1 9.4* 9.9*  HCT 42.4 30.2* 30.2*  MCV 87.2 86.8 83.9  MCH 27.0 27.0 27.5  MCHC 30.9 31.1 32.8  RDW 18.1* 18.2* 18.0*  PLT 149* 116* 119*    BNP Recent Labs  Lab 10/09/20 1925  BNP 417.2*     DDimer  Recent Labs  Lab 10/10/20 0000  DDIMER 5.13*    Radiology    NM Pulmonary Perfusion  Result Date:  10/10/2020 CLINICAL DATA:  Pulmonary emboli suspected, high probability. Shortness of breath over the last 3 days. EXAM: NUCLEAR MEDICINE PERFUSION LUNG SCAN TECHNIQUE: Perfusion images were obtained in multiple projections after intravenous injection of radiopharmaceutical. Ventilation scans intentionally deferred if perfusion scan and chest x-ray adequate for interpretation during COVID 19 epidemic. RADIOPHARMACEUTICALS:  4.4 mCi Tc-23m MAA IV COMPARISON:  Chest radiography yesterday. FINDINGS: Negative perfusion study.  No evidence of focal defect. IMPRESSION: Negative/normal nuclear medicine pulmonary perfusion study. No visible defect. Electronically Signed   By: Paulina Fusi M.D.   On: 10/10/2020 11:43   DG Chest Port 1 View  Result Date: 10/09/2020 CLINICAL DATA:  Weakness.  Patient fell x2 today. EXAM: PORTABLE CHEST 1 VIEW COMPARISON:  12/29/2019 FINDINGS: Heart size is accentuated by AP portable technique. The lungs are clear. No pneumothorax. No consolidation or pulmonary edema. Remote RIGHT shoulder arthroplasty. IMPRESSION: No evidence for acute  abnormality. Electronically Signed    By: Norva Pavlov M.D.   On: 10/09/2020 19:39   DG Knee Left Port  Result Date: 10/09/2020 CLINICAL DATA:  Fall, left knee pain EXAM: PORTABLE LEFT KNEE - 1-2 VIEW COMPARISON:  None. FINDINGS: Two view radiograph left knee demonstrates a a comminuted fracture of the distal left femoral diaphysis extending to the level of the femoral arthroplasty component with prominent medial and posterior butterfly components. There is mild medial and moderate posterior angulation of the distal fracture fragment. 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 Arthroplasty components are in expected alignment. Small left knee effusion is present. IMPRESSION: Comminuted, angulated fracture of the distal left femur just above the femoral arthroplasty component. Electronically Signed   By: Helyn Numbers M.D.   On: 10/09/2020 19:04   ECHOCARDIOGRAM COMPLETE  Result Date: 10/10/2020    ECHOCARDIOGRAM REPORT   Patient Name:   Heather [name] Date of Exam: 10/10/2020 Medical Rec #:  [id number]              Height:       63.0 in Accession #:    [id number]             Weight:       188.5 lb Date of Birth:  09-Feb-1946             BSA:          1.886 m Patient Age:    74 years               BP:           140/71 mmHg Patient Gender: F                      HR:           93 bpm. Exam Location:  Inpatient Procedure: 2D Echo Indications:    elevated troponin  History:        Patient has no prior history of Echocardiogram examinations.                 Signs/Symptoms:elevated troponin; Risk Factors:Dyslipidemia and                 Hypertension.  Sonographer:    Delcie Roch RDCS Referring Phys: [id number] ANASTASSIA DOUTOVA IMPRESSIONS  1. Mild mid-apical anterolateral and posterolateral hypokinesis. Left ventricular ejection fraction, by estimation, is 50 to 55%. The left ventricle has low normal function. The left ventricle demonstrates regional wall motion abnormalities (see scoring  diagram/findings for description). There is  mild concentric  left ventricular hypertrophy. Left ventricular diastolic parameters are consistent with Grade II diastolic dysfunction (pseudonormalization). Elevated left ventricular end-diastolic pressure.  2. Right ventricular systolic function is normal. The right ventricular size is normal. There is mildly elevated pulmonary artery systolic pressure.  3. Left atrial size was mildly dilated.  4. The mitral valve is normal in structure. No evidence of mitral valve regurgitation. No evidence of mitral stenosis.  5. The aortic valve is tricuspid. Aortic valve regurgitation is not visualized. No aortic stenosis is present.  6. The inferior vena cava is normal in size with greater than 50% respiratory variability, suggesting right atrial pressure of 3 mmHg. FINDINGS  Left Ventricle: Mild mid-apical anterolateral and posterolateral hypokinesis. Left ventricular ejection fraction, by estimation, is 50 to 55%. The left ventricle has low normal function. The left ventricle demonstrates regional wall motion abnormalities. The left ventricular internal cavity size was normal in size. There is mild concentric left ventricular hypertrophy. Left ventricular diastolic parameters are consistent with Grade II diastolic dysfunction (pseudonormalization). Elevated left ventricular end-diastolic pressure. Right Ventricle: The right ventricular size is normal. No increase in right ventricular wall thickness. Right ventricular systolic function is normal. There is mildly elevated pulmonary artery systolic pressure. The tricuspid regurgitant velocity is 3.19  m/s, and with an assumed right atrial pressure of 3 mmHg, the estimated right ventricular systolic pressure is 43.7 mmHg. Left Atrium: Left atrial size was mildly dilated. Right Atrium: Right atrial size was normal in size. Pericardium: There is no evidence of pericardial effusion. Mitral Valve: The mitral valve is normal in structure. No evidence of mitral valve regurgitation.  No evidence of mitral valve stenosis. Tricuspid Valve: The tricuspid valve is normal in structure. Tricuspid valve regurgitation is trivial. No evidence of tricuspid stenosis. Aortic Valve: The aortic valve is tricuspid. Aortic valve regurgitation is not visualized. No aortic stenosis is present. Pulmonic Valve: The pulmonic valve was normal in structure. Pulmonic valve regurgitation is mild. No evidence of pulmonic stenosis. Aorta: The aortic root is normal in size and structure. Venous: The inferior vena cava is normal in size with greater than 50% respiratory variability, suggesting right atrial pressure of 3 mmHg. IAS/Shunts: No atrial level shunt detected by color flow Doppler.  LEFT VENTRICLE PLAX 2D LVIDd:         4.10 cm     Diastology LVIDs:         2.40 cm     LV e' medial:    6.53 cm/s LV PW:         1.20 cm     LV E/e' medial:  14.1 LV IVS:        1.06 cm     LV e' lateral:   9.36 cm/s LVOT diam:     1.80 cm     LV E/e' lateral: 9.8 LV SV:         43 LV SV Index:   23 LVOT Area:     2.54 cm  LV Volumes (MOD) LV vol d, MOD A4C: 71.5 ml LV vol s, MOD A4C: 35.7 ml LV SV MOD A4C:     71.5 ml RIGHT VENTRICLE             IVC RV S prime:     11.20 cm/s  IVC diam: 1.70 cm TAPSE (M-mode): 2.0 cm LEFT ATRIUM             Index       RIGHT ATRIUM  Index LA diam:        4.00 cm 2.12 cm/m  RA Area:     11.90 cm LA Vol (A2C):   75.3 ml 39.94 ml/m RA Volume:   27.30 ml  14.48 ml/m LA Vol (A4C):   45.0 ml 23.87 ml/m LA Biplane Vol: 59.3 ml 31.45 ml/m  AORTIC VALVE LVOT Vmax:   108.00 cm/s LVOT Vmean:  69.500 cm/s LVOT VTI:    0.170 m  AORTA Ao Root diam: 2.80 cm Ao Asc diam:  3.10 cm MITRAL VALVE               TRICUSPID VALVE MV Area (PHT): 4.80 cm    TR Peak grad:   40.7 mmHg MV Decel Time: 158 msec    TR Vmax:        319.00 cm/s MV E velocity: 92.10 cm/s MV A velocity: 84.80 cm/s  SHUNTS MV E/A ratio:  1.09        Systemic VTI:  0.17 m                            Systemic Diam: 1.80 cm Chilton Si  MD Electronically signed by Chilton Si MD Signature Date/Time: 10/10/2020/6:46:32 PM    Final     Cardiac Studies   2D echo 10/10/2020 IMPRESSIONS    1. Mild mid-apical anterolateral and posterolateral hypokinesis. Left  ventricular ejection fraction, by estimation, is 50 to 55%. The left  ventricle has low normal function. The left ventricle demonstrates  regional wall motion abnormalities (see scoring   diagram/findings for description). There is mild concentric left  ventricular hypertrophy. Left ventricular diastolic parameters are  consistent with Grade II diastolic dysfunction (pseudonormalization).  Elevated left ventricular end-diastolic pressure.   2. Right ventricular systolic function is normal. The right ventricular  size is normal. There is mildly elevated pulmonary artery systolic  pressure.   3. Left atrial size was mildly dilated.   4. The mitral valve is normal in structure. No evidence of mitral valve  regurgitation. No evidence of mitral stenosis.   5. The aortic valve is tricuspid. Aortic valve regurgitation is not  visualized. No aortic stenosis is present.   6. The inferior vena cava is normal in size with greater than 50%  respiratory variability, suggesting right atrial pressure of 3 mmHg.   Patient Profile     75 y.o. female with a hx of hypertension, asthma, bilateral knee replacement, crest syndrome and hyperlipidemia who is being seen 10/10/2020 for the evaluation of elevated troponin at the request of Dr. Jacqulyn Bath.  Assessment & Plan    Chest pain with elevated troponin / NSTEMI -No issue prior to this past weekend.   -developed N/V/diarrhea and then chest pain -EKG with inferior lateral T wave inversion concerning for ischemia.EKG much worse than prior EKGs -Hs-troponin 555>>316>>295>>294.  -no PE on VQ scan -2D echo showed E 50-55% with mild mid-apical anterolateral and posterolateral HK -patient needs cath prior to undergoing knee surgery -she  is NPO -will plan LHC with possible PCI today -Shared Decision Making/Informed Consent{ The risks [stroke (1 in 1000), death (1 in 1000), kidney failure [usually temporary] (1 in 500), bleeding (1 in 200), allergic reaction [possibly serious] (1 in 200)], benefits (diagnostic support and management of coronary artery disease) and alternatives of a cardiac catheterization were discussed in detail with Ms. Darrington and she is willing to proceed.  -Patient is on heparin for anticoagulation currently.   -  start ASA  daily and Lopressor  BID -increase Crestor to  daily  2.  LV dysfunction -likely ischemic related based on presentation and elevated Trop -cath today -start Lopressor  BID (SVT suppression ) and consolidate to Toprol at discharge -restart ARB home dose if BP remains stable on BB   2.  AKI -Likely due to poor p.o. intake secondary to nausea vomiting diarrhea -Kidney function improved to 0.86 with hydration   3.  Hypertension -Home antihypertensive on hold due to soft blood pressure and AKI -BP much improved this am -start Lopressor  BID for BP and SVT suppression -resume ARB if BP stable after addition of BB  4.  Prolonged QT -Improved to QT/QTcB 449/562 ms -Avoid QT prolonging agent   5.  Femur Fx related to mechanical fall -distal femur fracture in a patient with a total knee arthroplasty.  -This requires operative fixation per ortho  I have spent a total of 35 minutes with patient reviewing 2D echo , telemetry, EKGs, labs and examining patient as well as establishing an assessment and plan that was discussed with the patient.  > 50% of time was spent in direct patient care.     For questions or updates, please contact [facility] Please consult [email or web address] for contact info under        Signed, Armanda Magic, MD  10/11/2020, 8:34 AM

## 2020-10-11 NOTE — Progress Notes (Signed)
Cardiac Cath results reviewed  Conclusion      Prox LAD lesion is 30% stenosed.   Prox RCA to Mid RCA lesion is 20% stenosed.   LV end diastolic pressure is normal.   Minimal nonobstructive CAD Normal LVEDP   Plan: medical management.  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  She has had some episodes of SVT on tele today and given IV Lopressor.  Currently in SVT.  Will start Lopressor 25mg  BID and consolidate to Toprol at discharge.  Add ASA 81mg  daily.  Add Entresto 24-26mg  BID after surgery tomorrow if BP is stable.  Current goal preop is to get HR under control.  QTC on EKG remains prolonged but may be from EKG changes with marked ST/T wave abnormality resulting from stress MI.  Would recommend repeat echo in 2 months to see if LVF has normalized.

## 2020-10-11 NOTE — Progress Notes (Signed)
ANTICOAGULATION CONSULT NOTE  Pharmacy Consult for Heparin Indication: NSTEMI  Allergies  Allergen Reactions   Augmentin [Amoxicillin-Pot Clavulanate]     Has tolerated Ancef   Patient Measurements: Height: 5\' 6"  (167.6 cm) Weight: 58.2 kg (128 lb 4.9 oz) IBW/kg (Calculated) : 59.3 Heparin Dosing Weight: 70kg  Vital Signs: Temp: 99.3 F (37.4 C) (08/23 0714) Temp Source: Oral (08/23 0714) BP: 132/75 (08/23 0714) Pulse Rate: 100 (08/23 0714)  Labs: Recent Labs    10/09/20 1840 10/09/20 2056 10/09/20 2317 10/10/20 0000 10/10/20 0100 10/10/20 0540 10/10/20 0639 10/10/20 1226 10/10/20 2211 10/11/20 0037 10/11/20 0628  HGB 13.1  --   --   --   --   --  9.4*  --   --  9.9*  --   HCT 42.4  --   --   --   --   --  30.2*  --   --  30.2*  --   PLT 149*  --   --   --   --   --  116*  --   --  119*  --   LABPROT  --   --  18.1*  --   --   --   --   --   --   --   --   INR  --   --  1.5*  --   --   --   --   --   --   --   --   HEPARINUNFRC  --   --   --   --   --   --   --  <0.10* 0.23*  --  0.17*  CREATININE 2.21*  --   --  1.40*  --  1.10*  --   --   --  0.86  --   CKTOTAL  --   --   --  75  --   --   --   --   --   --   --   TROPONINIHS  --    < > 316*  --  295* 294*  --   --   --  239*  --    < > = values in this interval not displayed.    Estimated Creatinine Clearance: 53.5 mL/min (by C-G formula based on SCr of 0.86 mg/dL).  Medical History: Past Medical History:  Diagnosis Date   Anxiety    Closed right ankle fracture    Complication of anesthesia    Depression    Headache    Hyperlipidemia    Hypertension    IBS (irritable bowel syndrome)    PONV (postoperative nausea and vomiting)    Seasonal allergies    Medications:  Infusions:   heparin 1,400 Units/hr (10/10/20 2303)   methocarbamol (ROBAXIN) IV     Assessment: 6 yoF who presents with chest pain, weakness, fall at home with hip fx and persistent hypotension, hypoxia since admission. D-Dimer  elevated at 5.13. Pharmacy originally consulted to start IV heparin for possible PE - VQ scan negative for PE. Pt now also with NSTEMI so heparin will be continued.   Heparin level down further to 0.17 on gtt at 1400 units/hr. No issues with line or bleeding reported per RN. Hgb 9.9 (?whether 13.1 was accurate on admission).  Goal of Therapy:  Heparin level 0.3-0.7 units/ml Monitor platelets by anticoagulation protocol: Yes  Plan:  Rebolus heparin 1000 units IV Increase heparin infusion to 1600 units/hr Check heparin level in  8hr  Christoper Fabian, PharmD, BCPS Please see amion for complete clinical pharmacist phone list 10/11/2020,7:28 AM

## 2020-10-11 NOTE — Progress Notes (Signed)
Paged cardiologist on-call (Dr.Chandrasekhar) regarding patient's irregular HR this AM. Patient asymptomatic, but HR ranging from mid-90s-130. This also happened overnight (see notes from Dr.Ortiz).   Per cardiologist, his colleague will assess the patient this morning to assure she is ready for surgery this afternoon from a cardiac standpoint.

## 2020-10-11 NOTE — TOC CAGE-AID Note (Signed)
Transition of Care [facility]) - CAGE-AID Screening   Patient Details  Name: Heather [name] MRN: [id number] Date of Birth: 02-May-1945  [facility] CM/SW Contact:    Lossie Faes Tarpley-Carter, LCSWA Phone Number: 10/11/2020, 3:01 PM   Clinical Narrative: Pt participated in Cage-Aid.  Pt stated she does not use substance or ETOH.  Pt was not offered resources, due to no usage of substance or ETOH.   Tonatiuh Mallon Tarpley-Carter, MSW, LCSW-A Pronouns:  She/Her/Hers [facility]itions of Care Clinical Social Worker Direct Number:  [phone] [email or web address]    CAGE-AID Screening:    Have You Ever Felt You Ought to Cut Down on Your Drinking or Drug Use?: No Have People Annoyed You By [employer] Your Drinking Or Drug Use?: No Have You Felt Bad Or Guilty About Your Drinking Or Drug Use?: No Have You Ever Had a Drink or Used Drugs First Thing In The Morning to Steady Your Nerves or to Get Rid of a Hangover?: No CAGE-AID Score: 0  Substance Abuse Education Offered: No

## 2020-10-11 NOTE — Progress Notes (Signed)
TRH night shift telemetry coverage note.  The nursing staff reported that the patient would like something to help her sleep.  She weights 58 kg, also receiving as needed hydrocodone, methocarbamol and has scheduled gabapentin.  However, has been having difficulty sleeping since admitted 2 evenings ago.  I will try trazodone 25 mg p.o. nightly as needed.  Sanda Klein, MD.

## 2020-10-11 NOTE — Progress Notes (Signed)
Orthopedic Tech Progress Note Patient Details:  Heather [name] [birth year] [id number]  Heather [name] to apply KNEE IMMOBILIZER but patient was getting a bath. Told SECRETARY I would return when I could   Patient ID: Heather [name], female   DOB: [birth year], 75 y.o.   MRN: [id number]  Heather [name] 10/11/2020, 10:26 AM

## 2020-10-12 ENCOUNTER — Inpatient Hospital Stay (HOSPITAL_COMMUNITY): Payer: Medicare HMO

## 2020-10-12 ENCOUNTER — Encounter (HOSPITAL_COMMUNITY): Payer: Self-pay | Admitting: Cardiology

## 2020-10-12 DIAGNOSIS — R509 Fever, unspecified: Secondary | ICD-10-CM

## 2020-10-12 DIAGNOSIS — I10 Essential (primary) hypertension: Secondary | ICD-10-CM | POA: Diagnosis not present

## 2020-10-12 DIAGNOSIS — R9431 Abnormal electrocardiogram [ECG] [EKG]: Secondary | ICD-10-CM | POA: Diagnosis not present

## 2020-10-12 DIAGNOSIS — E440 Moderate protein-calorie malnutrition: Secondary | ICD-10-CM | POA: Insufficient documentation

## 2020-10-12 DIAGNOSIS — R778 Other specified abnormalities of plasma proteins: Secondary | ICD-10-CM | POA: Diagnosis not present

## 2020-10-12 DIAGNOSIS — S72402A Unspecified fracture of lower end of left femur, initial encounter for closed fracture: Secondary | ICD-10-CM | POA: Diagnosis not present

## 2020-10-12 DIAGNOSIS — I519 Heart disease, unspecified: Secondary | ICD-10-CM | POA: Diagnosis not present

## 2020-10-12 HISTORY — PX: IR PERC CHOLECYSTOSTOMY: IMG2326

## 2020-10-12 LAB — COMPREHENSIVE METABOLIC PANEL
ALT: 15 U/L (ref 0–44)
AST: 22 U/L (ref 15–41)
Albumin: 3 g/dL — ABNORMAL LOW (ref 3.5–5.0)
Alkaline Phosphatase: 59 U/L (ref 38–126)
Anion gap: 7 (ref 5–15)
BUN: 12 mg/dL (ref 8–23)
CO2: 22 mmol/L (ref 22–32)
Calcium: 7.9 mg/dL — ABNORMAL LOW (ref 8.9–10.3)
Chloride: 109 mmol/L (ref 98–111)
Creatinine, Ser: 0.81 mg/dL (ref 0.44–1.00)
GFR, Estimated: >60 mL/min (ref >60)
Glucose, Bld: 166 mg/dL — ABNORMAL HIGH (ref 70–99)
Potassium: 4.1 mmol/L (ref 3.5–5.1)
Sodium: 138 mmol/L (ref 135–145)
Total Bilirubin: 0.8 mg/dL (ref 0.3–1.2)
Total Protein: 5.5 g/dL — ABNORMAL LOW (ref 6.5–8.1)

## 2020-10-12 LAB — CBC WITH DIFFERENTIAL/PLATELET
Abs Immature Granulocytes: 0.03 10*3/uL (ref 0.00–0.07)
Basophils Absolute: 0 10*3/uL (ref 0.0–0.1)
Basophils Relative: 0 %
Eosinophils Absolute: 0.1 10*3/uL (ref 0.0–0.5)
Eosinophils Relative: 1 %
HCT: 29.1 % — ABNORMAL LOW (ref 36.0–46.0)
Hemoglobin: 9.3 g/dL — ABNORMAL LOW (ref 12.0–15.0)
Immature Granulocytes: 0 %
Lymphocytes Relative: 14 %
Lymphs Abs: 1.4 10*3/uL (ref 0.7–4.0)
MCH: 26.8 pg (ref 26.0–34.0)
MCHC: 32 g/dL (ref 30.0–36.0)
MCV: 83.9 fL (ref 80.0–100.0)
Monocytes Absolute: 0.6 10*3/uL (ref 0.1–1.0)
Monocytes Relative: 5 %
Neutro Abs: 8.4 10*3/uL — ABNORMAL HIGH (ref 1.7–7.7)
Neutrophils Relative %: 80 %
Platelets: 115 10*3/uL — ABNORMAL LOW (ref 150–400)
RBC: 3.47 MIL/uL — ABNORMAL LOW (ref 3.87–5.11)
RDW: 18 % — ABNORMAL HIGH (ref 11.5–15.5)
WBC: 10.6 10*3/uL — ABNORMAL HIGH (ref 4.0–10.5)
nRBC: 0 % (ref 0.0–0.2)

## 2020-10-12 LAB — TYPE AND SCREEN
ABO/RH(D): O NEG
Antibody Screen: NEGATIVE

## 2020-10-12 LAB — APTT: aPTT: 25 seconds (ref 24–36)

## 2020-10-12 LAB — PROTIME-INR
INR: 1.3 — ABNORMAL HIGH (ref 0.8–1.2)
Prothrombin Time: 16.1 seconds — ABNORMAL HIGH (ref 11.4–15.2)

## 2020-10-12 LAB — MAGNESIUM: Magnesium: 1.9 mg/dL (ref 1.7–2.4)

## 2020-10-12 LAB — LACTIC ACID, PLASMA
Lactic Acid, Venous: 2.7 mmol/L (ref 0.5–1.9)
Lactic Acid, Venous: 1 mmol/L (ref 0.5–1.9)

## 2020-10-12 LAB — PROCALCITONIN: Procalcitonin: <0.1 ng/mL

## 2020-10-12 LAB — PHOSPHORUS: Phosphorus: 2.1 mg/dL — ABNORMAL LOW (ref 2.5–4.6)

## 2020-10-12 MED ORDER — LIDOCAINE HCL 1 % IJ SOLN
INTRAMUSCULAR | Status: AC
  Filled 2020-10-12: qty 20

## 2020-10-12 MED ORDER — FENTANYL CITRATE (PF) 100 MCG/2ML IJ SOLN
INTRAMUSCULAR | Status: AC
  Filled 2020-10-12: qty 2

## 2020-10-12 MED ORDER — MIDAZOLAM HCL 2 MG/2ML IJ SOLN
INTRAMUSCULAR | Status: AC | PRN
Start: 2020-10-12 — End: 2020-10-12
  Administered 2020-10-12: 1 mg via INTRAVENOUS
  Administered 2020-10-12: 0.5 mg via INTRAVENOUS
  Administered 2020-10-12: 1 mg via INTRAVENOUS

## 2020-10-12 MED ORDER — METOPROLOL TARTRATE 5 MG/5ML IV SOLN
5.0000 mg | Freq: Four times a day (QID) | INTRAVENOUS | Status: DC
  Administered 2020-10-12 – 2020-10-13 (×4): 5 mg via INTRAVENOUS
  Filled 2020-10-12 (×4): qty 5

## 2020-10-12 MED ORDER — ACETAMINOPHEN 325 MG PO TABS
650.0000 mg | ORAL_TABLET | Freq: Four times a day (QID) | ORAL | Status: DC | PRN
  Administered 2020-10-12 (×2): 650 mg via ORAL
  Filled 2020-10-12 (×2): qty 2

## 2020-10-12 MED ORDER — SODIUM CHLORIDE 0.9 % IV SOLN
2.0000 g | Freq: Two times a day (BID) | INTRAVENOUS | Status: DC
  Administered 2020-10-12 – 2020-10-15 (×6): 2 g via INTRAVENOUS
  Filled 2020-10-12 (×6): qty 2

## 2020-10-12 MED ORDER — LACTATED RINGERS IV BOLUS
500.0000 mL | Freq: Once | INTRAVENOUS | Status: AC
  Administered 2020-10-12: 500 mL via INTRAVENOUS

## 2020-10-12 MED ORDER — CHLORHEXIDINE GLUCONATE 0.12 % MT SOLN: 15.0000 mL | Freq: Once | OROMUCOSAL | Status: AC

## 2020-10-12 MED ORDER — FENTANYL CITRATE (PF) 100 MCG/2ML IJ SOLN
INTRAMUSCULAR | Status: AC | PRN
  Administered 2020-10-12 (×2): 50 ug via INTRAVENOUS

## 2020-10-12 MED ORDER — SODIUM CHLORIDE 0.9 % IV SOLN
2.0000 g | Freq: Once | INTRAVENOUS | Status: AC
  Administered 2020-10-12: 2 g via INTRAVENOUS
  Filled 2020-10-12: qty 2

## 2020-10-12 MED ORDER — SODIUM CHLORIDE 0.9% FLUSH
5.0000 mL | Freq: Three times a day (TID) | INTRAVENOUS | Status: DC
  Administered 2020-10-13 – 2020-10-18 (×15): 5 mL

## 2020-10-12 MED ORDER — METRONIDAZOLE 500 MG/100ML IV SOLN
500.0000 mg | Freq: Three times a day (TID) | INTRAVENOUS | Status: DC
  Administered 2020-10-12 – 2020-10-15 (×10): 500 mg via INTRAVENOUS
  Filled 2020-10-12 (×11): qty 100

## 2020-10-12 MED ORDER — MIDAZOLAM HCL 2 MG/2ML IJ SOLN
INTRAMUSCULAR | Status: AC
  Filled 2020-10-12: qty 4

## 2020-10-12 MED ORDER — LACTATED RINGERS IV BOLUS (SEPSIS)
1000.0000 mL | Freq: Once | INTRAVENOUS | Status: AC
  Administered 2020-10-12: 1000 mL via INTRAVENOUS

## 2020-10-12 MED ORDER — VANCOMYCIN HCL IN DEXTROSE 1-5 GM/200ML-% IV SOLN
1000.0000 mg | Freq: Once | INTRAVENOUS | Status: AC
  Administered 2020-10-12: 1000 mg via INTRAVENOUS
  Filled 2020-10-12: qty 200

## 2020-10-12 MED ORDER — SODIUM CHLORIDE 0.9 % IV SOLN
2.0000 g | INTRAVENOUS | Status: AC
  Filled 2020-10-12 (×2): qty 2

## 2020-10-12 MED ORDER — ORAL CARE MOUTH RINSE: 15.0000 mL | Freq: Once | OROMUCOSAL | Status: AC

## 2020-10-12 MED ORDER — POTASSIUM PHOSPHATES 15 MMOLE/5ML IV SOLN
20.0000 mmol | Freq: Once | INTRAVENOUS | Status: AC
  Administered 2020-10-12: 20 mmol via INTRAVENOUS
  Filled 2020-10-12: qty 6.67

## 2020-10-12 MED ORDER — LACTATED RINGERS IV SOLN: INTRAVENOUS | Status: DC

## 2020-10-12 MED ORDER — FENTANYL CITRATE (PF) 100 MCG/2ML IJ SOLN
25.0000 ug | INTRAMUSCULAR | Status: AC | PRN
  Administered 2020-10-13: 50 ug via INTRAVENOUS
  Administered 2020-10-13: 25 ug via INTRAVENOUS
  Filled 2020-10-12 (×2): qty 2

## 2020-10-12 MED ORDER — VANCOMYCIN HCL IN DEXTROSE 1-5 GM/200ML-% IV SOLN: 1000.0000 mg | INTRAVENOUS | Status: DC

## 2020-10-12 MED ORDER — FENTANYL CITRATE (PF) 250 MCG/5ML IJ SOLN
INTRAMUSCULAR | Status: AC
  Filled 2020-10-12: qty 5

## 2020-10-12 MED ORDER — CHLORHEXIDINE GLUCONATE 0.12 % MT SOLN
OROMUCOSAL | Status: AC
  Administered 2020-10-12: 15 mL via OROMUCOSAL
  Filled 2020-10-12: qty 15

## 2020-10-12 NOTE — Progress Notes (Addendum)
Progress Note  Patient Name: Heather [name] Date of Encounter: 10/12/2020  [facility] Cardiologist: None   Subjective   Spiked temp to 102.5 this am, tachycardic in the 110's and O2 sats 92% on RA.  Lactic acid up at 2.7.  Sepsis workup in progress and now getting IVF and broad spectrum antibx.  Dx with acute cholecystitis  Inpatient Medications    Scheduled Meds:  [MAR Hold] buPROPion  300 mg Oral Daily   [MAR Hold] calcium carbonate  1 tablet Oral BID WC   chlorhexidine  15 mL Mouth/Throat Once   Or   mouth rinse  15 mL Mouth Rinse Once   chlorhexidine       [MAR Hold] Chlorhexidine Gluconate Cloth  6 each Topical Daily   [MAR Hold] feeding supplement  237 mL Oral TID BM   [MAR Hold] gabapentin  300 mg Oral Daily   And   [MAR Hold] gabapentin  600 mg Oral QHS   [MAR Hold] heparin  5,000 Units Subcutaneous Q8H   [MAR Hold] multivitamin with minerals  1 tablet Oral Daily   [MAR Hold] rosuvastatin  10 mg Oral Daily   [MAR Hold] sodium chloride flush  3 mL Intravenous Q12H   Continuous Infusions:  [MAR Hold] sodium chloride     [MAR Hold] ceFEPime (MAXIPIME) IV     lactated ringers     [MAR Hold] methocarbamol (ROBAXIN) IV     [MAR Hold] metronidazole 500 mg (10/12/20 0340)   potassium PHOSPHATE IVPB (in mmol) 20 mmol (10/12/20 0614)   PRN Meds: [MAR Hold] sodium chloride, [MAR Hold] acetaminophen, [MAR Hold] albuterol, [MAR Hold] HYDROcodone-acetaminophen, [MAR Hold] methocarbamol **OR** [MAR Hold] methocarbamol (ROBAXIN) IV, [MAR Hold] sodium chloride flush, [MAR Hold] traZODone   Vital Signs    Vitals:   10/12/20 0146 10/12/20 0245 10/12/20 0346 10/12/20 0731  BP: (!) 156/76 (!) 154/81 (!) 146/74 (!) 143/81  Pulse: (!) 114 (!) 114 (!) 112 98  Resp: (!) 25 (!) 29 (!) 24 (!) 21  Temp: (!) 102.9 F (39.4 C) (!) 101.7 F (38.7 C) 99.3 F (37.4 C) 98.5 F (36.9 C)  TempSrc: Oral Oral Oral Oral  SpO2: 92% (!) 89% 90% 100%  Weight:      Height:         Intake/Output Summary (Last 24 hours) at 10/12/2020 0839 Last data filed at 10/12/2020 6295 Gross per 24 hour  Intake 158.11 ml  Output 2225 ml  Net -2066.89 ml    Last 3 Weights 10/10/2020 05/14/2020 12/29/2019  Weight (lbs) 128 lb 4.9 oz 188 lb 7.9 oz 188 lb 6.4 oz  Weight (kg) 58.2 kg 85.5 kg 85.458 kg      Telemetry    Sinus tachycardia- Personally Reviewed  ECG    No new EKG to review - Personally Reviewed  Physical Exam   GEN: Well nourished, well developed in no acute distress HEENT: Normal NECK: No JVD; No carotid bruits LYMPHATICS: No lymphadenopathy CARDIAC:regular but tachycardic, no murmurs, rubs, gallops RESPIRATORY:  Clear to auscultation without rales, wheezing or rhonchi  ABDOMEN: Soft, non-tender, non-distended MUSCULOSKELETAL:  No edema; No deformity  SKIN: Warm and dry NEUROLOGIC:  Alert and oriented x 3 PSYCHIATRIC:  Normal affect   Labs    High Sensitivity Troponin:   Recent Labs  Lab 10/09/20 2056 10/09/20 2317 10/10/20 0100 10/10/20 0540 10/11/20 0037  TROPONINIHS 555* 316* 295* 294* 239*       Chemistry Recent Labs  Lab 10/09/20 1840 10/10/20  0000 10/10/20 0540 10/11/20 0037 10/12/20 0110  NA 137   < > 139 138 138  K 5.7*   < > 4.0 3.7 4.1  CL 111   < > 115* 114* 109  CO2 18*   < > 18* 19* 22  GLUCOSE 105*   < > 84 100* 166*  BUN 30*   < > 20 10 12   CREATININE 2.21*   < > 1.10* 0.86 0.81  CALCIUM 6.6*   < > 6.6* 6.6* 7.9*  PROT 5.7*  --  4.7*  --  5.5*  ALBUMIN 3.0*  --  2.5* 2.2* 3.0*  AST 30  --  15  --  22  ALT 13  --  12  --  15  ALKPHOS 54  --  44  --  59  BILITOT 1.3*  --  0.6  --  0.8  GFRNONAA 23*   < > 53* >60 >60  ANIONGAP 8   < > 6 5 7    < > = values in this interval not displayed.      Hematology Recent Labs  Lab 10/10/20 0639 10/11/20 0037 10/12/20 0110  WBC 13.1* 12.4* 10.6*  RBC 3.48* 3.60* 3.47*  HGB 9.4* 9.9* 9.3*  HCT 30.2* 30.2* 29.1*  MCV 86.8 83.9 83.9  MCH 27.0 27.5 26.8  MCHC 31.1  32.8 32.0  RDW 18.2* 18.0* 18.0*  PLT 116* 119* 115*     BNP Recent Labs  Lab 10/09/20 1925  BNP 417.2*      DDimer  Recent Labs  Lab 10/10/20 0000  DDIMER 5.13*     Radiology    NM Pulmonary Perfusion  Result Date: 10/10/2020 CLINICAL DATA:  Pulmonary emboli suspected, high probability. Shortness of breath over the last 3 days. EXAM: NUCLEAR MEDICINE PERFUSION LUNG SCAN TECHNIQUE: Perfusion images were obtained in multiple projections after intravenous injection of radiopharmaceutical. Ventilation scans intentionally deferred if perfusion scan and chest x-ray adequate for interpretation during COVID 19 epidemic. RADIOPHARMACEUTICALS:  4.4 mCi Tc-44m MAA IV COMPARISON:  Chest radiography yesterday. FINDINGS: Negative perfusion study.  No evidence of focal defect. IMPRESSION: Negative/normal nuclear medicine pulmonary perfusion study. No visible defect. Electronically Signed   By: 10/12/2020 M.D.   On: 10/10/2020 11:43   CARDIAC CATHETERIZATION  Result Date: 10/11/2020   Prox LAD lesion is 30% stenosed.   Prox RCA to Mid RCA lesion is 20% stenosed.   LV end diastolic pressure is normal. Minimal nonobstructive CAD Normal LVEDP Plan: medical management.   DG Chest Port 1 View  Result Date: 10/12/2020 CLINICAL DATA:  Sepsis. EXAM: PORTABLE CHEST 1 VIEW COMPARISON:  Chest radiograph dated 10/09/2020. FINDINGS: There is cardiomegaly with vascular congestion. No focal consolidation, pleural effusion or pneumothorax. Degenerative changes of the spine. No acute osseous pathology. Partially visualized right shoulder arthroplasty. IMPRESSION: Cardiomegaly with vascular congestion. No focal consolidation. Electronically Signed   By: 10/14/2020 M.D.   On: 10/12/2020 01:33   ECHOCARDIOGRAM COMPLETE  Result Date: 10/10/2020    ECHOCARDIOGRAM REPORT   Patient Name:   Heather [name] Date of Exam: 10/10/2020 Medical Rec #:  Dennison Mascot              Height:       63.0 in Accession #:     10/12/2020             Weight:       188.5 lb Date of Birth:  [birth year]  BSA:          1.886 m Patient Age:    74 years               BP:           140/71 mmHg Patient Gender: F                      HR:           93 bpm. Exam Location:  Inpatient Procedure: 2D Echo Indications:    elevated troponin  History:        Patient has no prior history of Echocardiogram examinations.                 Signs/Symptoms:elevated troponin; Risk Factors:Dyslipidemia and                 Hypertension.  Sonographer:    Delcie Roch RDCS Referring Phys: [id number] ANASTASSIA DOUTOVA IMPRESSIONS  1. Mild mid-apical anterolateral and posterolateral hypokinesis. Left ventricular ejection fraction, by estimation, is 50 to 55%. The left ventricle has low normal function. The left ventricle demonstrates regional wall motion abnormalities (see scoring  diagram/findings for description). There is mild concentric left ventricular hypertrophy. Left ventricular diastolic parameters are consistent with Grade II diastolic dysfunction (pseudonormalization). Elevated left ventricular end-diastolic pressure.  2. Right ventricular systolic function is normal. The right ventricular size is normal. There is mildly elevated pulmonary artery systolic pressure.  3. Left atrial size was mildly dilated.  4. The mitral valve is normal in structure. No evidence of mitral valve regurgitation. No evidence of mitral stenosis.  5. The aortic valve is tricuspid. Aortic valve regurgitation is not visualized. No aortic stenosis is present.  6. The inferior vena cava is normal in size with greater than 50% respiratory variability, suggesting right atrial pressure of 3 mmHg. FINDINGS  Left Ventricle: Mild mid-apical anterolateral and posterolateral hypokinesis. Left ventricular ejection fraction, by estimation, is 50 to 55%. The left ventricle has low normal function. The left ventricle demonstrates regional wall motion abnormalities. The left ventricular  internal cavity size was normal in size. There is mild concentric left ventricular hypertrophy. Left ventricular diastolic parameters are consistent with Grade II diastolic dysfunction (pseudonormalization). Elevated left ventricular end-diastolic pressure. Right Ventricle: The right ventricular size is normal. No increase in right ventricular wall thickness. Right ventricular systolic function is normal. There is mildly elevated pulmonary artery systolic pressure. The tricuspid regurgitant velocity is 3.19  m/s, and with an assumed right atrial pressure of 3 mmHg, the estimated right ventricular systolic pressure is 43.7 mmHg. Left Atrium: Left atrial size was mildly dilated. Right Atrium: Right atrial size was normal in size. Pericardium: There is no evidence of pericardial effusion. Mitral Valve: The mitral valve is normal in structure. No evidence of mitral valve regurgitation. No evidence of mitral valve stenosis. Tricuspid Valve: The tricuspid valve is normal in structure. Tricuspid valve regurgitation is trivial. No evidence of tricuspid stenosis. Aortic Valve: The aortic valve is tricuspid. Aortic valve regurgitation is not visualized. No aortic stenosis is present. Pulmonic Valve: The pulmonic valve was normal in structure. Pulmonic valve regurgitation is mild. No evidence of pulmonic stenosis. Aorta: The aortic root is normal in size and structure. Venous: The inferior vena cava is normal in size with greater than 50% respiratory variability, suggesting right atrial pressure of 3 mmHg. IAS/Shunts: No atrial level shunt detected by color flow Doppler.  LEFT VENTRICLE PLAX 2D LVIDd:  4.10 cm     Diastology LVIDs:         2.40 cm     LV e' medial:    6.53 cm/s LV PW:         1.20 cm     LV E/e' medial:  14.1 LV IVS:        1.06 cm     LV e' lateral:   9.36 cm/s LVOT diam:     1.80 cm     LV E/e' lateral: 9.8 LV SV:         43 LV SV Index:   23 LVOT Area:     2.54 cm  LV Volumes (MOD) LV vol d, MOD  A4C: 71.5 ml LV vol s, MOD A4C: 35.7 ml LV SV MOD A4C:     71.5 ml RIGHT VENTRICLE             IVC RV S prime:     11.20 cm/s  IVC diam: 1.70 cm TAPSE (M-mode): 2.0 cm LEFT ATRIUM             Index       RIGHT ATRIUM           Index LA diam:        4.00 cm 2.12 cm/m  RA Area:     11.90 cm LA Vol (A2C):   75.3 ml 39.94 ml/m RA Volume:   27.30 ml  14.48 ml/m LA Vol (A4C):   45.0 ml 23.87 ml/m LA Biplane Vol: 59.3 ml 31.45 ml/m  AORTIC VALVE LVOT Vmax:   108.00 cm/s LVOT Vmean:  69.500 cm/s LVOT VTI:    0.170 m  AORTA Ao Root diam: 2.80 cm Ao Asc diam:  3.10 cm MITRAL VALVE               TRICUSPID VALVE MV Area (PHT): 4.80 cm    TR Peak grad:   40.7 mmHg MV Decel Time: 158 msec    TR Vmax:        319.00 cm/s MV E velocity: 92.10 cm/s MV A velocity: 84.80 cm/s  SHUNTS MV E/A ratio:  1.09        Systemic VTI:  0.17 m                            Systemic Diam: 1.80 cm Chilton Si MD Electronically signed by Chilton Si MD Signature Date/Time: 10/10/2020/6:46:32 PM    Final    US Abdomen Limited RUQ (LIVER/GB)  Result Date: 10/12/2020 CLINICAL DATA:  Right upper quadrant abdominal pain EXAM: ULTRASOUND ABDOMEN LIMITED RIGHT UPPER QUADRANT COMPARISON:  None. FINDINGS: Gallbladder: Multiple large small bowel shadowing stones are present. The largest measures up to 2.3 mm. Gallbladder wall is thickened, measuring up to 4.8 mm. No sonographic Eulah Pont sign is reported. Common bile duct: Diameter: 4.0 mm, within normal limits Liver: No focal lesion identified. Within normal limits in parenchymal echogenicity. Portal vein is patent on color Doppler imaging with normal direction of blood flow towards the liver. Other: None. IMPRESSION: 1. Cholelithiasis with gallbladder wall thickening suggesting acute cholecystitis. 2. No sonographic Eulah Pont sign is reported. Electronically Signed   By: Marin Roberts M.D.   On: 10/12/2020 07:45    Cardiac Studies   2D echo 10/10/2020 IMPRESSIONS    1. Mild mid-apical  anterolateral and posterolateral hypokinesis. Left  ventricular ejection fraction, by estimation, is 50 to 55%. The left  ventricle has low  normal function. The left ventricle demonstrates  regional wall motion abnormalities (see scoring   diagram/findings for description). There is mild concentric left  ventricular hypertrophy. Left ventricular diastolic parameters are  consistent with Grade II diastolic dysfunction (pseudonormalization).  Elevated left ventricular end-diastolic pressure.   2. Right ventricular systolic function is normal. The right ventricular  size is normal. There is mildly elevated pulmonary artery systolic  pressure.   3. Left atrial size was mildly dilated.   4. The mitral valve is normal in structure. No evidence of mitral valve  regurgitation. No evidence of mitral stenosis.   5. The aortic valve is tricuspid. Aortic valve regurgitation is not  visualized. No aortic stenosis is present.   6. The inferior vena cava is normal in size with greater than 50%  respiratory variability, suggesting right atrial pressure of 3 mmHg.   Patient Profile     75 y.o. female with a hx of hypertension, asthma, bilateral knee replacement, crest syndrome and hyperlipidemia who is being seen 10/10/2020 for the evaluation of elevated troponin at the request of Dr. Jacqulyn Bath.  Assessment & Plan    Chest pain with elevated troponin / NSTEMI -No issue prior to this past weekend.   -developed N/V/diarrhea and then chest pain -EKG with inferior lateral T wave inversion concerning for ischemia.EKG much worse than prior EKGs -Hs-troponin 555>>316>>295>>294.  -no PE on VQ scan -2D echo showed E 50-55% with mild mid-apical anterolateral and posterolateral HK -cath revealed mild non obstructive CAD with 30% pLAD and 20% pRCA with normal LVEDP -elevated trop c/w stress MI (Takosubo) -continue ASA  daily and Lopressor  BID (will consolidate to Toprol at discharge) -continue Crestor to   daily  2.  LV dysfunction -minimal non obstructive CAD at cath>>c/w Takotsubo DCM -started on Lopressor  BID (SVT suppression ) and consolidate to Toprol at discharge -currently BB on hold for surgery>>consider changing to Lopressor IV  q6hrs if NPO for more than 24 hours -given new dx of acute cholecystitis with fever and sepsis, will hold off on adding Entresto at this time>>once she is ready for discharge this can be added along with SPiro 12.5mg  daily    3.  AKI -Likely due to poor p.o. intake secondary to nausea vomiting diarrhea -Kidney function improved to 0.81 with hydration   4.  Hypertension -Home antihypertensive on hold due to soft blood pressure and AKI -BP much improved this am -started Lopressor  BID for BP and SVT suppression but now NPO>>may need to convert to IV Lopressor if expected to be NPO for a while  5.  Prolonged QT -Improved to QT/QTcB 449/562 ms -Avoid QT prolonging agent   6.  Femur Fx related to mechanical fall -distal femur fracture in a patient with a total knee arthroplasty.  -This requires operative fixation per ortho but now on hold due to new fever  7.  Acute cholecystis/Fever/Sepsis -spiked fever to 102 this am -lactic acid elevated -now with acute cholecystitis -surgery consult pending  Patient has non obstructive CAD and low normal LVF with mid to apical AK on echo c/w Takotsubo DCM.  SHe is stable from a cardiac standpoint and ok to proceed with surgery on femur and gallbladder if needed.    I have spent a total of 35 minutes with patient reviewing 2D echo , telemetry, EKGs, labs and examining patient as well as establishing an assessment and plan that was discussed with the patient.  > 50% of time was spent in direct patient  care.     For questions or updates, please contact [facility] Please consult [email or web address] for contact info under        Signed, Armanda Magic, MD  10/12/2020, 8:39 AM

## 2020-10-12 NOTE — Anesthesia Preprocedure Evaluation (Addendum)
Anesthesia Evaluation  Patient identified by MRN, date of birth, ID band Patient awake    Reviewed: Allergy & Precautions, NPO status , Patient's Chart, lab work & pertinent test results  History of Anesthesia Complications Negative for: history of anesthetic complications  Airway Mallampati: II  TM Distance: >3 FB Neck ROM: Full    Dental  (+) Dental Advisory Given, Edentulous Upper, Edentulous Lower   Pulmonary asthma , former smoker,    Pulmonary exam normal        Cardiovascular hypertension, Pt. on medications + CAD (cath revealed mild non obstructive CAD with 30% pLAD and 20% pRCA with normal LVEDP) and + Past MI   Rhythm:Regular Rate:Normal     Neuro/Psych Anxiety Depression negative neurological ROS     GI/Hepatic negative GI ROS, Neg liver ROS,   Endo/Other  negative endocrine ROS  Renal/GU ARFRenal disease     Musculoskeletal   Abdominal   Peds  Hematology  (+) anemia , Hgb 8.4, plts 127   Anesthesia Other Findings  Patient presented with mechanical fall and distal femur fracture, but also found to have NSTEMI, sepsis, AKI and acute cholangitis. Now s/p cath which showed nonobstructive disease and perc chole drain.  Per Cardiology: Patient has non obstructive CAD and low normal LVF with mid to apical AK on echo c/w Takotsubo DCM.  SHe is stable from a cardiac standpoint and ok to proceed with surgery on femur.  Reproductive/Obstetrics                           Lab Results  Component Value Date   WBC 10.6 (H) 10/12/2020   HGB 9.3 (L) 10/12/2020   HCT 29.1 (L) 10/12/2020   MCV 83.9 10/12/2020   PLT 115 (L) 10/12/2020   Lab Results  Component Value Date   CREATININE 0.81 10/12/2020   BUN 12 10/12/2020   NA 138 10/12/2020   K 4.1 10/12/2020   CL 109 10/12/2020   CO2 22 10/12/2020    Anesthesia Physical Anesthesia Plan  ASA: 2  Anesthesia Plan: General   Post-op  Pain Management:    Induction: Intravenous  PONV Risk Score and Plan: 3 and Ondansetron and Dexamethasone  Airway Management Planned: Oral ETT  Additional Equipment:   Intra-op Plan:   Post-operative Plan: Extubation in OR  Informed Consent: I have reviewed the patients History and Physical, chart, labs and discussed the procedure including the risks, benefits and alternatives for the proposed anesthesia with the patient or authorized representative who has indicated his/her understanding and acceptance.     Dental advisory given  Plan Discussed with: CRNA  Anesthesia Plan Comments:         Anesthesia Quick Evaluation

## 2020-10-12 NOTE — Progress Notes (Signed)
   10/11/20 2343  Assess: MEWS Score  Temp (!) 100.7 F (38.2 C) (nurse notified)  BP (!) 164/82  Pulse Rate (!) 110  ECG Heart Rate (!) 111  Resp (!) 24  SpO2 97 %  Assess: MEWS Score  MEWS Temp 1  MEWS Systolic 0  MEWS Pulse 2  MEWS RR 1  MEWS LOC 0  MEWS Score 4  MEWS Score Color Red  Assess: if the MEWS score is Yellow or Red  Were vital signs taken at a resting state? Yes  Focused Assessment No change from prior assessment  Early Detection of Sepsis Score *See Row Information* High  MEWS guidelines implemented *See Row Information* Yes  Treat  MEWS Interventions Escalated (See documentation below)  Pain Scale 0-10  Pain Score 5  Pain Type Acute pain  Pain Location Head  Pain Intervention(s) Medication (See eMAR)  Take Vital Signs  Increase Vital Sign Frequency  Red: Q 1hr X 4 then Q 4hr X 4, if remains red, continue Q 4hrs  Escalate  MEWS: Escalate Red: discuss with charge nurse/RN and provider, consider discussing with RRT  Notify: Charge Nurse/RN  Name of Charge Nurse/RN Notified Christina, RN  Date Charge Nurse/RN Notified 10/11/20  Time Charge Nurse/RN Notified 2350  Notify: Provider  Provider Name/Title Dr. Robb Matar  Date Provider Notified 10/12/20  Time Provider Notified 0040  Notification Type Page  Notification Reason Other (Comment) (Red Mews)  Provider response See new orders  Date of Provider Response 10/12/20  Time of Provider Response 0050

## 2020-10-12 NOTE — Progress Notes (Signed)
PROGRESS NOTE    Heather [name]  ZOX:[id number] DOB: 13-Jul-1945 DOA: 10/09/2020 PCP: Janece Canterbury, MD   Chief Complaint  Patient presents with   Fall   Brief Narrative:  Heather [name] is Heather [name] 75 y.o. female with medical history significant for depression, hyperlipidemia, hypertension, asthma, L3 burst fracture s/p repair, who was brought to the hospital after Heather [name] fall at home.   She was diagnosed with Heather [name] distal femoral fracture.  She was also found to have AKI, hyperkalemia, elevated troponins concerning for NSTEMI.  She was treated with IV fluids and analgesics.  She underwent left heart cath which showed minimal nonobstructive CAD.    She developed fever and tachycardia overnight 8/23-24.  She was started on broad spectrum abx and treated for sepsis.  RUQ US showed cholecystitis.  Surgery was c/s and she's now s/p perc chole drain.   Assessment & Plan:   Active Problems:   Essential hypertension   Hypotension   Femur fracture (HCC)   Prolonged QT interval   Abnormal ECG   AKI (acute kidney injury) (HCC)   Hyperkalemia   Elevated troponin   Leukocytosis   Malnutrition of moderate degree  Sepsis 2/2 Acute Cholecystitis Fever, tachycardia, tachypnea - meets criteria for sepsis RUQ [place] with acute cholecystitis CXR with cardiomegaly with vascular congestion Blood cultures x2 pending She's now s/p perc chole drain by IR Appreciate surgery recs - defer lap chole at this time, perc chole by IR -> planning for drain placement with interval lap chole in 6-8 weeks.  Continue abx. Appreciate IR recs UA on admission not concerning for UTI.  1/2 sets of blood cultures from admission with coag negative staph, suspect contaminant.  Left Distal Femur Fracture Plan for surgical intervention 8/25  NSTEMI S/p LHC with nonobstructive CAD Echo showing mild mid apical anterolateral and posterolateral hypokinesis, EF 50-55%, grade II diastolic dysfunction, mildly elevated  PASP  Acute Kidney Injury Creatinine 2.21 at presentation  Improving  Hypocalcemia  Hypophosphatemia Follow   Prolonged Qtc Repeat EKG  Thrombocytopenia Continue to monitor   DVT prophylaxis: (heparin Code Status: (full  Family Communication: none at bedside Disposition:   Status is: Inpatient  Remains inpatient appropriate because:Inpatient level of care appropriate due to severity of illness  Dispo: The patient is from: Home              Anticipated d/c is to: Home              Patient currently is not medically stable to d/c.   Difficult to place patient No       Consultants:  Cardiology IR General surgery orthopedics  Procedures:  8/24   IMPRESSION: Successful placement of Heather [name] 10.2 French percutaneous, subhepatic/transperitoneal cholecystostomy tube.  8/23   Prox LAD lesion is 30% stenosed.   Prox RCA to Mid RCA lesion is 20% stenosed.   LV end diastolic pressure is normal.   Minimal nonobstructive CAD Normal LVEDP   Plan: medical management.  Echo IMPRESSIONS     1. Mild mid-apical anterolateral and posterolateral hypokinesis. Left  ventricular ejection fraction, by estimation, is 50 to 55%. The left  ventricle has low normal function. The left ventricle demonstrates  regional wall motion abnormalities (see scoring   diagram/findings for description). There is mild concentric left  ventricular hypertrophy. Left ventricular diastolic parameters are  consistent with Grade II diastolic dysfunction (pseudonormalization).  Elevated left ventricular end-diastolic pressure.   2. Right ventricular systolic function is normal. The right ventricular  size is normal. There is mildly elevated pulmonary artery systolic  pressure.   3. Left atrial size was mildly dilated.   4. The mitral valve is normal in structure. No evidence of mitral valve  regurgitation. No evidence of mitral stenosis.   5. The aortic valve is tricuspid. Aortic valve regurgitation  is not  visualized. No aortic stenosis is present.   6. The inferior vena cava is normal in size with greater than 50%  respiratory variability, suggesting right atrial pressure of 3 mmHg.   Antimicrobials:  Anti-infectives (From admission, onward)    Start     Dose/Rate Route Frequency Ordered Stop   10/13/20 0400  vancomycin (VANCOCIN) IVPB 1000 mg/200 mL premix  Status:  Discontinued        1,000 mg 200 mL/hr over 60 Minutes Intravenous Every 24 hours 10/12/20 0241 10/12/20 0810   10/12/20 1600  ceFEPIme (MAXIPIME) 2 g in sodium chloride 0.9 % 100 mL IVPB        2 g 200 mL/hr over 30 Minutes Intravenous Every 12 hours 10/12/20 0241     10/12/20 1330  cefOXitin (MEFOXIN) 2 g in sodium chloride 0.9 % 100 mL IVPB        2 g 200 mL/hr over 30 Minutes Intravenous To Radiology 10/12/20 1241 10/13/20 1330   10/12/20 0330  metroNIDAZOLE (FLAGYL) IVPB 500 mg        500 mg 100 mL/hr over 60 Minutes Intravenous Every 8 hours 10/12/20 0235     10/12/20 0330  vancomycin (VANCOCIN) IVPB 1000 mg/200 mL premix        1,000 mg 200 mL/hr over 60 Minutes Intravenous  Once 10/12/20 0237 10/12/20 1149   10/12/20 0330  ceFEPIme (MAXIPIME) 2 g in sodium chloride 0.9 % 100 mL IVPB        2 g 200 mL/hr over 30 Minutes Intravenous  Once 10/12/20 0237 10/12/20 1148   10/12/20 0100  vancomycin (VANCOREADY) IVPB 750 mg/150 mL  Status:  Discontinued        750 mg 150 mL/hr over 60 Minutes Intravenous Every 48 hours 10/09/20 2343 10/10/20 0958   10/10/20 0000  vancomycin (VANCOREADY) IVPB 1750 mg/350 mL        1,750 mg 175 mL/hr over 120 Minutes Intravenous  Once 10/09/20 2302 10/10/20 0308   10/09/20 2315  ceFEPIme (MAXIPIME) 2 g in sodium chloride 0.9 % 100 mL IVPB        2 g 200 mL/hr over 30 Minutes Intravenous  Once 10/09/20 2302 10/10/20 0026   10/09/20 2300  metroNIDAZOLE (FLAGYL) IVPB 500 mg  Status:  Discontinued        500 mg 100 mL/hr over 60 Minutes Intravenous Every 12 hours 10/09/20 2251  10/10/20 0958   10/09/20 2200  ceFEPIme (MAXIPIME) 2 g in sodium chloride 0.9 % 100 mL IVPB  Status:  Discontinued        2 g 200 mL/hr over 30 Minutes Intravenous Every 24 hours 10/09/20 2343 10/10/20 0958          Subjective: Asking about food  Objective: Vitals:   10/12/20 1445 10/12/20 1500 10/12/20 1644 10/12/20 1700  BP: 132/78  138/83   Pulse: 97  95   Resp: (!) Temp:   98.5 F (36.9 C)   TempSrc:   Oral   SpO2: 100%     Weight:      Height:        Intake/Output Summary (Last 24  hours) at 10/12/2020 1736 Last data filed at 10/12/2020 1700 Gross per 24 hour  Intake 358.11 ml  Output 2875 ml  Net -2516.89 ml   Filed Weights   10/10/20 2000  Weight: 58.2 kg    Examination:  General exam: Appears calm and comfortable  Respiratory system: Clear to auscultation. Respiratory effort normal. Cardiovascular system: S1 & S2 heard, RRR.  Gastrointestinal system: ttp in RUQ  Central nervous system: Alert and oriented. No focal neurological deficits. Extremities: no LEE Skin: No rashes, lesions or ulcers Psychiatry: Judgement and insight appear normal. Mood & affect appropriate.     Data Reviewed: I have personally reviewed following labs and imaging studies  CBC: Recent Labs  Lab 10/09/20 1840 10/10/20 0639 10/11/20 0037 10/12/20 0110  WBC 18.6* 13.1* 12.4* 10.6*  NEUTROABS 13.7* 10.3*  --  8.4*  HGB 13.1 9.4* 9.9* 9.3*  HCT 42.4 30.2* 30.2* 29.1*  MCV 87.2 86.8 83.9 83.9  PLT 149* 116* 119* 115*    Basic Metabolic Panel: Recent Labs  Lab 10/09/20 1840 10/10/20 0000 10/10/20 0540 10/11/20 0037 10/12/20 0110  NA 137 141 139 138 138  K 5.7* 4.1 4.0 3.7 4.1  CL 111 116* 115* 114* 109  CO2 18* 19* 18* 19* 22  GLUCOSE 105* 73 84 100* 166*  BUN 30* 24* 20 10 12   CREATININE 2.21* 1.40* 1.10* 0.86 0.81  CALCIUM 6.6* 6.1* 6.6* 6.6* 7.9*  MG  --  1.7  --   --  1.9  PHOS  --  2.5  --  1.4* 2.1*    GFR: Estimated Creatinine Clearance:  56.8 mL/min (by C-G formula based on SCr of 0.81 mg/dL).  Liver Function Tests: Recent Labs  Lab 10/09/20 1840 10/10/20 0540 10/11/20 0037 10/12/20 0110  AST 30 15  --  22  ALT 13 12  --  15  ALKPHOS 54 44  --  59  BILITOT 1.3* 0.6  --  0.8  PROT 5.7* 4.7*  --  5.5*  ALBUMIN 3.0* 2.5* 2.2* 3.0*    CBG: No results for input(s): GLUCAP in the last 168 hours.   Recent Results (from the past 240 hour(s))  Resp Panel by RT-PCR (Flu Verdine Grenfell&B, Covid) Nasopharyngeal Swab     Status: None   Collection Time: 10/09/20  6:41 PM   Specimen: Nasopharyngeal Swab; Nasopharyngeal(NP) swabs in vial transport medium  Result Value Ref Range Status   SARS Coronavirus 2 by RT PCR NEGATIVE NEGATIVE Final    Comment: (NOTE) SARS-CoV-2 target nucleic acids are NOT DETECTED.  The SARS-CoV-2 RNA is generally detectable in upper respiratory specimens during the acute phase of infection. The lowest concentration of SARS-CoV-2 viral copies this assay can detect is 138 copies/mL. Lyndal Alamillo negative result does not preclude SARS-Cov-2 infection and should not be used as the sole basis for treatment or other patient management decisions. Silus Lanzo negative result may occur with  improper specimen collection/handling, submission of specimen other than nasopharyngeal swab, presence of viral mutation(s) within the areas targeted by this assay, and inadequate number of viral copies(<138 copies/mL). Yvanna Vidas negative result must be combined with clinical observations, patient history, and epidemiological information. The expected result is Negative.  Fact Sheet for Patients:  10/11/20  Fact Sheet for Healthcare Providers:  BloggerCourse.com  This test is no t yet approved or cleared by the [place] FDA and  has been authorized for detection and/or diagnosis of SARS-CoV-2 by FDA under an Emergency Use Authorization (EUA). This EUA will remain  in  effect (meaning this  test can be used) for the duration of the COVID-19 declaration under Section 564(b)(1) of the Act, 21 U.S.C.section 360bbb-3(b)(1), unless the authorization is terminated  or revoked sooner.       Influenza Firman Petrow by PCR NEGATIVE NEGATIVE Final   Influenza B by PCR NEGATIVE NEGATIVE Final    Comment: (NOTE) The Xpert Xpress SARS-CoV-2/FLU/RSV plus assay is intended as an aid in the diagnosis of influenza from Nasopharyngeal swab specimens and should not be used as Mcadoo Muzquiz sole basis for treatment. Nasal washings and aspirates are unacceptable for Xpert Xpress SARS-CoV-2/FLU/RSV testing.  Fact Sheet for Patients: BloggerCourse.com  Fact Sheet for Healthcare Providers: SeriousBroker.it  This test is not yet approved or cleared by the [place] FDA and has been authorized for detection and/or diagnosis of SARS-CoV-2 by FDA under an Emergency Use Authorization (EUA). This EUA will remain in effect (meaning this test can be used) for the duration of the COVID-19 declaration under Section 564(b)(1) of the Act, 21 U.S.C. section 360bbb-3(b)(1), unless the authorization is terminated or revoked.  Performed at [facility], [address] [place], [place] [address]   Culture, blood (routine x 2)     Status: None (Preliminary result)   Collection Time: 10/09/20  7:55 PM   Specimen: BLOOD  Result Value Ref Range Status   Specimen Description   Final    BLOOD RIGHT ANTECUBITAL Performed at [facility], [address], [place], [place] [address]    Special Requests   Final    BOTTLES DRAWN AEROBIC AND ANAEROBIC Blood Culture results may not be optimal due to an inadequate volume of blood received in culture bottles Performed at [facility], [address], [place], [place] [address]    Culture   Final    NO GROWTH 2 DAYS Performed at [facility], [address],  [place], [place] [address]    Report Status PENDING  Incomplete  Culture, blood (routine x 2)     Status: Abnormal (Preliminary result)   Collection Time: 10/09/20  7:55 PM   Specimen: BLOOD  Result Value Ref Range Status   Specimen Description   Final    BLOOD LEFT ANTECUBITAL Performed at [facility], [address], [place], [place] [address]    Special Requests   Final    BOTTLES DRAWN AEROBIC AND ANAEROBIC Blood Culture results may not be optimal due to an inadequate volume of blood received in culture bottles Performed at [facility], [address], [place], [place] [address]    Culture  Setup Time   Final    GRAM POSITIVE COCCI IN CLUSTERS IN BOTH AEROBIC AND ANAEROBIC BOTTLES CRITICAL RESULT CALLED TO, READ BACK BY AND VERIFIED WITH: L SEAY,PHARMD@2330  10/10/20 MK    Culture (Tramain Gershman)  Final    STAPHYLOCOCCUS EPIDERMIDIS STAPHYLOCOCCUS HOMINIS THE SIGNIFICANCE OF ISOLATING THIS ORGANISM FROM Marvene Strohm SINGLE SET OF BLOOD CULTURES WHEN MULTIPLE SETS ARE DRAWN IS UNCERTAIN. PLEASE NOTIFY THE MICROBIOLOGY DEPARTMENT WITHIN ONE WEEK IF SPECIATION AND SENSITIVITIES ARE REQUIRED. Performed at [facility], [address], [place], [place] [address]    Report Status PENDING  Incomplete  Blood Culture ID Panel (Reflexed)     Status: Abnormal   Collection Time: 10/09/20  7:55 PM  Result Value Ref Range Status   Enterococcus faecalis NOT DETECTED NOT DETECTED Final   Enterococcus Faecium NOT DETECTED NOT DETECTED Final   Listeria monocytogenes NOT  DETECTED NOT DETECTED Final   Staphylococcus species DETECTED (Novah Goza) NOT DETECTED Final    Comment: CRITICAL RESULT CALLED TO, READ BACK BY AND VERIFIED WITH: L SEAY,PHARMD@2330  10/10/20 MK    Staphylococcus aureus (BCID) NOT DETECTED NOT DETECTED Final   Staphylococcus epidermidis DETECTED (Kell Ferris) NOT DETECTED Final    Comment: Methicillin (oxacillin) resistant coagulase negative staphylococcus. Possible blood  culture contaminant (unless isolated from more than one blood culture draw or clinical case suggests pathogenicity). No antibiotic treatment is indicated for blood  culture contaminants. CRITICAL RESULT CALLED TO, READ BACK BY AND VERIFIED WITH: L SEAY,PHARMD@2330  10/10/20 MK    Staphylococcus lugdunensis NOT DETECTED NOT DETECTED Final   Streptococcus species NOT DETECTED NOT DETECTED Final   Streptococcus agalactiae NOT DETECTED NOT DETECTED Final   Streptococcus pneumoniae NOT DETECTED NOT DETECTED Final   Streptococcus pyogenes NOT DETECTED NOT DETECTED Final   Deicy Rusk.calcoaceticus-baumannii NOT DETECTED NOT DETECTED Final   Bacteroides fragilis NOT DETECTED NOT DETECTED Final   Enterobacterales NOT DETECTED NOT DETECTED Final   Enterobacter cloacae complex NOT DETECTED NOT DETECTED Final   Escherichia coli NOT DETECTED NOT DETECTED Final   Klebsiella aerogenes NOT DETECTED NOT DETECTED Final   Klebsiella oxytoca NOT DETECTED NOT DETECTED Final   Klebsiella pneumoniae NOT DETECTED NOT DETECTED Final   Proteus species NOT DETECTED NOT DETECTED Final   Salmonella species NOT DETECTED NOT DETECTED Final   Serratia marcescens NOT DETECTED NOT DETECTED Final   Haemophilus influenzae NOT DETECTED NOT DETECTED Final   Neisseria meningitidis NOT DETECTED NOT DETECTED Final   Pseudomonas aeruginosa NOT DETECTED NOT DETECTED Final   Stenotrophomonas maltophilia NOT DETECTED NOT DETECTED Final   Candida albicans NOT DETECTED NOT DETECTED Final   Candida auris NOT DETECTED NOT DETECTED Final   Candida glabrata NOT DETECTED NOT DETECTED Final   Candida krusei NOT DETECTED NOT DETECTED Final   Candida parapsilosis NOT DETECTED NOT DETECTED Final   Candida tropicalis NOT DETECTED NOT DETECTED Final   Cryptococcus neoformans/gattii NOT DETECTED NOT DETECTED Final   Methicillin resistance mecA/C DETECTED (Larna Capelle) NOT DETECTED Final    Comment: CRITICAL RESULT CALLED TO, READ BACK BY AND VERIFIED WITH: L  SEAY,PHARMD  10/10/20 MK Performed at [facility], [address] [place], [place] [address]   MRSA Next Gen by PCR, Nasal     Status: None   Collection Time: 10/10/20  4:17 PM   Specimen: Nasal Mucosa; Nasal Swab  Result Value Ref Range Status   MRSA by PCR Next Gen NOT DETECTED NOT DETECTED Final    Comment: (NOTE) The GeneXpert MRSA Assay (FDA approved for NASAL specimens only), is one component of Asad Keeven comprehensive MRSA colonization surveillance program. It is not intended to diagnose MRSA infection nor to guide or monitor treatment for MRSA infections. Test performance is not FDA approved in patients less than 21 years old. Performed at [facility], [address], [place], [place] [address]          Radiology Studies: CARDIAC CATHETERIZATION  Result Date: 10/11/2020   Prox LAD lesion is 30% stenosed.   Prox RCA to Mid RCA lesion is 20% stenosed.   LV end diastolic pressure is normal. Minimal nonobstructive CAD Normal LVEDP Plan: medical management.   IR Perc Cholecystostomy  Result Date: 10/12/2020 INDICATION: 75 year old female with acute calculus cholecystitis, poor surgical candidate. EXAM: Percutaneous cholecystostomy tube placement MEDICATIONS: 2 g cefoxitin, intravenous; The antibiotic was administered within an appropriate time frame prior to the initiation of  the procedure. ANESTHESIA/SEDATION: Moderate (conscious) sedation was employed during this procedure. Heather [name] total of Versed 2.5 mg and Fentanyl 100 mcg was administered intravenously. Moderate Sedation Time: 11 minutes. The patient's level of consciousness and vital signs were monitored continuously by radiology nursing throughout the procedure under my direct supervision. FLUOROSCOPY TIME:  Fluoroscopy Time: 0 minutes 36 seconds (12.6 mGy). COMPLICATIONS: None immediate. PROCEDURE: Informed written consent was obtained from the patient after Adraine Biffle thorough discussion of the procedural risks, benefits and  alternatives. All questions were addressed. Maximal Sterile Barrier Technique was utilized including caps, mask, sterile gowns, sterile gloves, sterile drape, hand hygiene and skin antiseptic. Chudney Scheffler timeout was performed prior to the initiation of the procedure. The patient was placed supine on the angiographic table. The patient's right upper quadrant was then prepped and draped in normal sterile fashion with maximum sterile barrier. Ultrasound demonstrates Kirti Carl distended gallbladder with echogenic gallstones in the fundus. Subdermal Local anesthesia was provided at the planned skin entry site. Under ultrasound guidance, deeper local anesthetic was provided through anterior abdominal muscles. Ultrasound was used to puncture the gallbladder using Tylisha Danis 21 gauge Chiba needle via Arienne Gartin subhepatic approach with visualization of the lung treated to the gallbladder. Dhaval Woo 0.018 inch wire was advanced into the lumen and Elodie Panameno transition dilator placed. Dhruva Orndoff gentle hand injection of contrast was performed. Cholecystogram demonstrates irregular filling defects consistent with gallstones. The cystic duct is patent. Adir Schicker 0.035 inch exchange wire was placed in the tract was dilated. Arvel Oquinn 10.2 French multipurpose drainage catheter was advanced into the gallbladder lumen. The drain was then secured in place using Samiksha Pellicano 0-silk suture and Thursa Emme Stayfix device. Cashae Weich sterile dressing was applied. The tube was placed to bag drainage. The patient tolerated procedure well without evidence of immediate complication was transferred back to the floor in stable condition. IMPRESSION: Successful placement of Jaxtin Raimondo 10.2 French percutaneous, subhepatic/transperitoneal cholecystostomy tube. Marliss Coots, MD [facility] Radiology Electronically Signed   By: Marliss Coots M.D.   On: 10/12/2020 15:21   DG Chest Port 1 View  Result Date: 10/12/2020 CLINICAL DATA:  Sepsis. EXAM: PORTABLE CHEST 1 VIEW COMPARISON:  Chest radiograph dated  10/09/2020. FINDINGS: There is cardiomegaly with vascular congestion. No focal consolidation, pleural effusion or pneumothorax. Degenerative changes of the spine. No acute osseous pathology. Partially visualized right shoulder arthroplasty. IMPRESSION: Cardiomegaly with vascular congestion. No focal consolidation. Electronically Signed   By: Elgie Collard M.D.   On: 10/12/2020 01:33   ECHOCARDIOGRAM COMPLETE  Result Date: 10/10/2020    ECHOCARDIOGRAM REPORT   Patient Name:   STEPFANIE YOTT Date of Exam: 10/10/2020 Medical Rec #:  [id number]              Height:       63.0 in Accession #:    [id number]             Weight:       188.5 lb Date of Birth:  1945-11-14             BSA:          1.886 m Patient Age:    74 years               BP:           140/71 mmHg Patient Gender: F                      HR:  93 bpm. Exam Location:  Inpatient Procedure: 2D Echo Indications:    elevated troponin  History:        Patient has no prior history of Echocardiogram examinations.                 Signs/Symptoms:elevated troponin; Risk Factors:Dyslipidemia and                 Hypertension.  Sonographer:    Delcie Roch RDCS Referring Phys: [id number] ANASTASSIA DOUTOVA IMPRESSIONS  1. Mild mid-apical anterolateral and posterolateral hypokinesis. Left ventricular ejection fraction, by estimation, is 50 to 55%. The left ventricle has low normal function. The left ventricle demonstrates regional wall motion abnormalities (see scoring  diagram/findings for description). There is mild concentric left ventricular hypertrophy. Left ventricular diastolic parameters are consistent with Grade II diastolic dysfunction (pseudonormalization). Elevated left ventricular end-diastolic pressure.  2. Right ventricular systolic function is normal. The right ventricular size is normal. There is mildly elevated pulmonary artery systolic pressure.  3. Left atrial size was mildly dilated.  4. The mitral valve is normal in structure.  No evidence of mitral valve regurgitation. No evidence of mitral stenosis.  5. The aortic valve is tricuspid. Aortic valve regurgitation is not visualized. No aortic stenosis is present.  6. The inferior vena cava is normal in size with greater than 50% respiratory variability, suggesting right atrial pressure of 3 mmHg. FINDINGS  Left Ventricle: Mild mid-apical anterolateral and posterolateral hypokinesis. Left ventricular ejection fraction, by estimation, is 50 to 55%. The left ventricle has low normal function. The left ventricle demonstrates regional wall motion abnormalities. The left ventricular internal cavity size was normal in size. There is mild concentric left ventricular hypertrophy. Left ventricular diastolic parameters are consistent with Grade II diastolic dysfunction (pseudonormalization). Elevated left ventricular end-diastolic pressure. Right Ventricle: The right ventricular size is normal. No increase in right ventricular wall thickness. Right ventricular systolic function is normal. There is mildly elevated pulmonary artery systolic pressure. The tricuspid regurgitant velocity is 3.19  m/s, and with an assumed right atrial pressure of 3 mmHg, the estimated right ventricular systolic pressure is 43.7 mmHg. Left Atrium: Left atrial size was mildly dilated. Right Atrium: Right atrial size was normal in size. Pericardium: There is no evidence of pericardial effusion. Mitral Valve: The mitral valve is normal in structure. No evidence of mitral valve regurgitation. No evidence of mitral valve stenosis. Tricuspid Valve: The tricuspid valve is normal in structure. Tricuspid valve regurgitation is trivial. No evidence of tricuspid stenosis. Aortic Valve: The aortic valve is tricuspid. Aortic valve regurgitation is not visualized. No aortic stenosis is present. Pulmonic Valve: The pulmonic valve was normal in structure. Pulmonic valve regurgitation is mild. No evidence of pulmonic stenosis. Aorta: The  aortic root is normal in size and structure. Venous: The inferior vena cava is normal in size with greater than 50% respiratory variability, suggesting right atrial pressure of 3 mmHg. IAS/Shunts: No atrial level shunt detected by color flow Doppler.  LEFT VENTRICLE PLAX 2D LVIDd:         4.10 cm     Diastology LVIDs:         2.40 cm     LV e' medial:    6.53 cm/s LV PW:         1.20 cm     LV E/e' medial:  14.1 LV IVS:        1.06 cm     LV e' lateral:   9.36 cm/s LVOT diam:  1.80 cm     LV E/e' lateral: 9.8 LV SV:         43 LV SV Index:   23 LVOT Area:     2.54 cm  LV Volumes (MOD) LV vol d, MOD A4C: 71.5 ml LV vol s, MOD A4C: 35.7 ml LV SV MOD A4C:     71.5 ml RIGHT VENTRICLE             IVC RV S prime:     11.20 cm/s  IVC diam: 1.70 cm TAPSE (M-mode): 2.0 cm LEFT ATRIUM             Index       RIGHT ATRIUM           Index LA diam:        4.00 cm 2.12 cm/m  RA Area:     11.90 cm LA Vol (A2C):   75.3 ml 39.94 ml/m RA Volume:   27.30 ml  14.48 ml/m LA Vol (A4C):   45.0 ml 23.87 ml/m LA Biplane Vol: 59.3 ml 31.45 ml/m  AORTIC VALVE LVOT Vmax:   108.00 cm/s LVOT Vmean:  69.500 cm/s LVOT VTI:    0.170 m  AORTA Ao Root diam: 2.80 cm Ao Asc diam:  3.10 cm MITRAL VALVE               TRICUSPID VALVE MV Area (PHT): 4.80 cm    TR Peak grad:   40.7 mmHg MV Decel Time: 158 msec    TR Vmax:        319.00 cm/s MV E velocity: 92.10 cm/s MV Quisha Mabie velocity: 84.80 cm/s  SHUNTS MV E/Girtrude Enslin ratio:  1.09        Systemic VTI:  0.17 m                            Systemic Diam: 1.80 cm Chilton Si MD Electronically signed by Chilton Si MD Signature Date/Time: 10/10/2020/6:46:32 PM    Final    US Abdomen Limited RUQ (LIVER/GB)  Result Date: 10/12/2020 CLINICAL DATA:  Right upper quadrant abdominal pain EXAM: ULTRASOUND ABDOMEN LIMITED RIGHT UPPER QUADRANT COMPARISON:  None. FINDINGS: Gallbladder: Multiple large small bowel shadowing stones are present. The largest measures up to 2.3 mm. Gallbladder wall is thickened,  measuring up to 4.8 mm. No sonographic Eulah Pont sign is reported. Common bile duct: Diameter: 4.0 mm, within normal limits Liver: No focal lesion identified. Within normal limits in parenchymal echogenicity. Portal vein is patent on color Doppler imaging with normal direction of blood flow towards the liver. Other: None. IMPRESSION: 1. Cholelithiasis with gallbladder wall thickening suggesting acute cholecystitis. 2. No sonographic Eulah Pont sign is reported. Electronically Signed   By: Marin Roberts M.D.   On: 10/12/2020 07:45        Scheduled Meds:  buPROPion  300 mg Oral Daily   calcium carbonate  1 tablet Oral BID WC   Chlorhexidine Gluconate Cloth  6 each Topical Daily   feeding supplement  237 mL Oral TID BM   gabapentin  300 mg Oral Daily   And   gabapentin  600 mg Oral QHS   heparin  5,000 Units Subcutaneous Q8H   lidocaine       metoprolol tartrate  5 mg Intravenous Q6H   multivitamin with minerals  1 tablet Oral Daily   rosuvastatin  10 mg Oral Daily   sodium chloride flush  3 mL Intravenous Q12H  Continuous Infusions:  sodium chloride     ceFEPime (MAXIPIME) IV     cefOXitin     methocarbamol (ROBAXIN) IV     metronidazole 500 mg (10/12/20 1158)     LOS: 3 days    Time spent: over 30 min    Lacretia Nicks, MD [facility]   To contact the attending provider between 7A-7P or the covering provider during after hours 7P-7A, please log into the web site [email or web address] and access using universal [place] password for that web site. If you do not have the password, please call the hospital operator.  10/12/2020, 5:36 PM

## 2020-10-12 NOTE — Progress Notes (Signed)
PIV Compromised: Left AC Site erythemic, purulent drainiage at insertion site. Pt c/o tenderness to touch as well. PIV removed, cleaned and irrigated with NS. New Dressing in place.

## 2020-10-12 NOTE — Sedation Documentation (Signed)
Pharmacy notified by phone and sent message in Epic for Cefoxitin 2g IV ordered for procedure. Did not arrive during procedure. Will send to floor when it arrived. Pt's RN Renette Butters aware.

## 2020-10-12 NOTE — H&P (Addendum)
Chief Complaint: Patient was seen in consultation today for image guided percutaneous cholecystostomy tube placement Chief Complaint  Patient presents with   Fall   at the request of Barnetta Chapel, PA-C  Referring Physician(s): Barnetta Chapel, PA-C  Supervising Physician: Irish Lack  Patient Status: [facility] - In-pt  History of Present Illness: Heather [name] is a 75 y.o. 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 Patient was scheduled for surgery for the left femur fracture; however, patient developed right upper abdominal pain yesterday and had a fever T-max 101.7, patient underwent ultrasound abdomen limited right upper quadrant on 10/12/2020 which showed cholelithiasis with gallbladder wall thickening suggesting acute cholecystitis.  Patient was referred to general surgery for further evaluation management who recommended percutaneous cholecystotomy tube placement with IR to the patient.    IR was requested for image guided percutaneous cholecystectomy tube placement. Case was reviewed and approved by Dr. Fredia Sorrow.  Patient was seen in patient's room. Patient laying in bed, not in acute distress.  Reports mild right upper quadrant pain and left leg pain. Denise headache, fever, chills, shortness of breath, cough, chest pain, nausea ,vomiting, and bleeding.  Past Medical History:  Diagnosis Date   Anxiety    Closed right ankle fracture    Complication of anesthesia    Depression    Headache    Hyperlipidemia    Hypertension    IBS (irritable bowel syndrome)    PONV (postoperative nausea and vomiting)    Seasonal allergies     Past Surgical History:  Procedure Laterality Date    APPENDECTOMY     JOINT REPLACEMENT Bilateral    TKA   LEFT HEART CATH AND CORONARY ANGIOGRAPHY N/A 10/11/2020   Procedure: LEFT HEART CATH AND CORONARY ANGIOGRAPHY;  Surgeon: Swazil[place], Peter M, MD;  Location: [facility] INVASIVE CV LAB;  Service: Cardiovascular;  Laterality: N/A;   LUMBAR LAMINECTOMY/DECOMPRESSION MICRODISCECTOMY N/A 05/15/2020   Procedure: LUMBAR LAMINECTOMY/DECOMPRESSION OF Lumbar three;  Surgeon: Maeola Harman, MD;  Location: [facility] OR;  Service: Neurosurgery;  Laterality: N/A;   LUMBAR PERCUTANEOUS PEDICLE SCREW 2 LEVEL N/A 05/15/2020   Procedure: LUMBAR PERCUTANEOUS SCREW, Lumbar one - Lumbar two, Lumbar four - Lumbar five;  Surgeon: Maeola Harman, MD;  Location: [facility] OR;  Service: Neurosurgery;  Laterality: N/A;   ORIF ANKLE FRACTURE Right 06/05/2018   Procedure: OPEN REDUCTION INTERNAL FIXATION (ORIF) ANKLE FRACTURE;  Surgeon: Sheral Apley, MD;  Location: [facility];  Service: Orthopedics;  Laterality: Right;    Allergies: Augmentin [amoxicillin-pot clavulanate]  Medications: Prior to Admission medications   Medication Sig Start Date End Date Taking? Authorizing Provider  albuterol (VENTOLIN HFA) 108 (90 Base) MCG/ACT inhaler Inhale 2 puffs into the lungs every 6 (six) hours as needed for wheezing or shortness of breath. 07/12/19  Yes [provider]  buPROPion (WELLBUTRIN XL) 300 MG 24 hr tablet Take 300 mg by mouth daily.   Yes [provider]  celecoxib (CELEBREX) 200 MG capsule Take 200 mg by mouth 2 (two) times daily.   Yes [provider]  dicyclomine (BENTYL) 20 MG tablet Take 20 mg by mouth every 6 (six) hours as needed for spasms. 02/08/20  Yes [provider]  fluticasone (FLONASE) 50 MCG/ACT  nasal spray Place 1 spray into both nostrils daily.   Yes [provider]  gabapentin (NEURONTIN) 300 MG capsule Take 300-600 mg by mouth 2 (two) times daily. Take 1 capsule (300 mg) in the morning & Take 2 capsules (600 mg) at  bedtime   Yes [provider]  HYDROcodone-acetaminophen (NORCO/VICODIN) 5-325 MG tablet Take 1-2 tablets by mouth every 4 (four) hours as needed for severe pain ((score 7 to 10)). 05/24/20  Yes Maeola Harman, MD  methocarbamol (ROBAXIN) 500 MG tablet Take 500 mg by mouth every 12 (twelve) hours as needed for muscle spasms.   Yes [provider]  montelukast (SINGULAIR) 10 MG tablet Take 10 mg by mouth at bedtime.   Yes [provider]  omeprazole (PRILOSEC) 40 MG capsule Take 1 capsule (40 mg total) by mouth daily. 12/29/19  Yes Nyoka Cowden, MD  ondansetron (ZOFRAN-ODT) 4 MG disintegrating tablet Take 4 mg by mouth every 8 (eight) hours as needed for nausea or vomiting. 10/08/20  Yes [provider]  rosuvastatin (CRESTOR) 10 MG tablet Take 10 mg by mouth daily.   Yes [provider]  tacrolimus (PROTOPIC) 0.03 % ointment Apply 1 application topically 2 (two) times daily. 02/25/20  Yes [provider]  traZODone (DESYREL) 150 MG tablet Take 150 mg by mouth at bedtime. 09/19/20  Yes [provider]  valsartan (DIOVAN) 80 MG tablet Take 1 tablet (80 mg total) by mouth daily. 12/29/19  Yes Nyoka Cowden, MD     Family History  Problem Relation Age of Onset   Hypertension Other     Social History   Socioeconomic History   Marital status: Single    Spouse name: Not on file   Number of children: Not on file   Years of education: Not on file   Highest education level: Not on file  Occupational History   Not on file  Tobacco Use   Smoking status: Former   Smokeless tobacco: Never  Substance and Sexual Activity   Alcohol use: Not Currently    Comment: quit 22 yrs ago   Drug use: Never   Sexual activity: Not on file  Other Topics Concern   Not on file  Social History Narrative   ** Merged History Encounter **       Social Determinants of Health   Financial Resource Strain: Not on file  Food Insecurity: Not on file   Transportation Needs: Not on file  Physical Activity: Not on file  Stress: Not on file  Social Connections: Not on file     Review of Systems: A 12 point ROS discussed and pertinent positives are indicated in the HPI above.  All other systems are negative.   Vital Signs: BP (!) 161/88 (BP Location: Right Arm)   Pulse 100   Temp 98.6 F (37 C) (Oral)   Resp 20   Ht  (1.676 m)   Wt 128 lb 4.9 oz (58.2 kg)   SpO2 100%   BMI 20.71 kg/m   Physical Exam  Vitals and nursing note reviewed.  Constitutional:      General: He is not in acute distress.    Appearance: Normal appearance.  HENT:     Head: Normocephalic and atraumatic.     Mouth/Throat:     Mouth: Mucous membranes are moist.     Pharynx: Oropharynx is clear.  Cardiovascular:     Rate and Rhythm: Normal rate and regular rhythm.     Pulses:  Normal pulses.     Heart sounds: Normal heart sounds.  Pulmonary:     Effort: Pulmonary effort is normal.     Breath sounds: Normal breath sounds. No wheezing, rhonchi or rales.  Abdominal:     General: Bowel sounds are normal. There is no distension.     Palpations: Abdomen is soft.  Skin:    General: Skin is warm and dry.  Neurological:     Mental Status: He is alert and oriented to person, place, and time.  Psychiatric:        Mood and Affect: Mood normal.        Behavior: Behavior normal.    MD Evaluation Airway: WNL Heart: WNL Abdomen: WNL Chest/ Lungs: WNL ASA  Classification: 3 Mallampati/Airway Score: Two  Imaging: NM Pulmonary Perfusion  Result Date: 10/10/2020 CLINICAL DATA:  Pulmonary emboli suspected, high probability. Shortness of breath over the last 3 days. EXAM: NUCLEAR MEDICINE PERFUSION LUNG SCAN TECHNIQUE: Perfusion images were obtained in multiple projections after intravenous injection of radiopharmaceutical. Ventilation scans intentionally deferred if perfusion scan and chest x-ray adequate for interpretation during COVID 19 epidemic.  RADIOPHARMACEUTICALS:  4.4 mCi Tc-28m MAA IV COMPARISON:  Chest radiography yesterday. FINDINGS: Negative perfusion study.  No evidence of focal defect. IMPRESSION: Negative/normal nuclear medicine pulmonary perfusion study. No visible defect. Electronically Signed   By: Paulina Fusi M.D.   On: 10/10/2020 11:43   CARDIAC CATHETERIZATION  Result Date: 10/11/2020   Prox LAD lesion is 30% stenosed.   Prox RCA to Mid RCA lesion is 20% stenosed.   LV end diastolic pressure is normal. Minimal nonobstructive CAD Normal LVEDP Plan: medical management.   DG Chest Port 1 View  Result Date: 10/12/2020 CLINICAL DATA:  Sepsis. EXAM: PORTABLE CHEST 1 VIEW COMPARISON:  Chest radiograph dated 10/09/2020. FINDINGS: There is cardiomegaly with vascular congestion. No focal consolidation, pleural effusion or pneumothorax. Degenerative changes of the spine. No acute osseous pathology. Partially visualized right shoulder arthroplasty. IMPRESSION: Cardiomegaly with vascular congestion. No focal consolidation. Electronically Signed   By: Elgie Collard M.D.   On: 10/12/2020 01:33   DG Chest Port 1 View  Result Date: 10/09/2020 CLINICAL DATA:  Weakness.  Patient fell x2 today. EXAM: PORTABLE CHEST 1 VIEW COMPARISON:  12/29/2019 FINDINGS: Heart size is accentuated by AP portable technique. The lungs are clear. No pneumothorax. No consolidation or pulmonary edema. Remote RIGHT shoulder arthroplasty. IMPRESSION: No evidence for acute  abnormality. Electronically Signed   By: Norva Pavlov M.D.   On: 10/09/2020 19:39   DG Knee Left Port  Result Date: 10/09/2020 CLINICAL DATA:  Fall, left knee pain EXAM: PORTABLE LEFT KNEE - 1-2 VIEW COMPARISON:  None. FINDINGS: Two view radiograph left knee demonstrates a a comminuted fracture of the distal left femoral diaphysis extending to the level of the femoral arthroplasty component with prominent medial and posterior butterfly components. There is mild medial and moderate posterior  angulation of the distal fracture fragment. 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 Arthroplasty components are in expected alignment. Small left knee effusion is present. IMPRESSION: Comminuted, angulated fracture of the distal left femur just above the femoral arthroplasty component. Electronically Signed   By: Helyn Numbers M.D.   On: 10/09/2020 19:04   ECHOCARDIOGRAM COMPLETE  Result Date: 10/10/2020    ECHOCARDIOGRAM REPORT   Patient Name:   Heather [name] Date of Exam: 10/10/2020 Medical Rec #:  [id number]              Height:  63.0 in Accession #:    [id number]             Weight:       188.5 lb Date of Birth:  June 15, 1945             BSA:          1.886 m Patient Age:    74 years               BP:           140/71 mmHg Patient Gender: F                      HR:           93 bpm. Exam Location:  Inpatient Procedure: 2D Echo Indications:    elevated troponin  History:        Patient has no prior history of Echocardiogram examinations.                 Signs/Symptoms:elevated troponin; Risk Factors:Dyslipidemia and                 Hypertension.  Sonographer:    Delcie Roch RDCS Referring Phys: [id number] ANASTASSIA DOUTOVA IMPRESSIONS  1. Mild mid-apical anterolateral and posterolateral hypokinesis. Left ventricular ejection fraction, by estimation, is 50 to 55%. The left ventricle has low normal function. The left ventricle demonstrates regional wall motion abnormalities (see scoring  diagram/findings for description). There is mild concentric left ventricular hypertrophy. Left ventricular diastolic parameters are consistent with Grade II diastolic dysfunction (pseudonormalization). Elevated left ventricular end-diastolic pressure.  2. Right ventricular systolic function is normal. The right ventricular size is normal. There is mildly elevated pulmonary artery systolic pressure.  3. Left atrial size was mildly dilated.  4. The mitral valve is normal in structure. No evidence of mitral  valve regurgitation. No evidence of mitral stenosis.  5. The aortic valve is tricuspid. Aortic valve regurgitation is not visualized. No aortic stenosis is present.  6. The inferior vena cava is normal in size with greater than 50% respiratory variability, suggesting right atrial pressure of 3 mmHg. FINDINGS  Left Ventricle: Mild mid-apical anterolateral and posterolateral hypokinesis. Left ventricular ejection fraction, by estimation, is 50 to 55%. The left ventricle has low normal function. The left ventricle demonstrates regional wall motion abnormalities. The left ventricular internal cavity size was normal in size. There is mild concentric left ventricular hypertrophy. Left ventricular diastolic parameters are consistent with Grade II diastolic dysfunction (pseudonormalization). Elevated left ventricular end-diastolic pressure. Right Ventricle: The right ventricular size is normal. No increase in right ventricular wall thickness. Right ventricular systolic function is normal. There is mildly elevated pulmonary artery systolic pressure. The tricuspid regurgitant velocity is 3.19  m/s, and with an assumed right atrial pressure of 3 mmHg, the estimated right ventricular systolic pressure is 43.7 mmHg. Left Atrium: Left atrial size was mildly dilated. Right Atrium: Right atrial size was normal in size. Pericardium: There is no evidence of pericardial effusion. Mitral Valve: The mitral valve is normal in structure. No evidence of mitral valve regurgitation. No evidence of mitral valve stenosis. Tricuspid Valve: The tricuspid valve is normal in structure. Tricuspid valve regurgitation is trivial. No evidence of tricuspid stenosis. Aortic Valve: The aortic valve is tricuspid. Aortic valve regurgitation is not visualized. No aortic stenosis is present. Pulmonic Valve: The pulmonic valve was normal in structure. Pulmonic valve regurgitation is mild. No evidence of pulmonic stenosis. Aorta: The aortic root is normal in  size and structure. Venous: The inferior vena cava is normal in size with greater than 50% respiratory variability, suggesting right atrial pressure of 3 mmHg. IAS/Shunts: No atrial level shunt detected by color flow Doppler.  LEFT VENTRICLE PLAX 2D LVIDd:         4.10 cm     Diastology LVIDs:         2.40 cm     LV e' medial:    6.53 cm/s LV PW:         1.20 cm     LV E/e' medial:  14.1 LV IVS:        1.06 cm     LV e' lateral:   9.36 cm/s LVOT diam:     1.80 cm     LV E/e' lateral: 9.8 LV SV:         43 LV SV Index:   23 LVOT Area:     2.54 cm  LV Volumes (MOD) LV vol d, MOD A4C: 71.5 ml LV vol s, MOD A4C: 35.7 ml LV SV MOD A4C:     71.5 ml RIGHT VENTRICLE             IVC RV S prime:     11.20 cm/s  IVC diam: 1.70 cm TAPSE (M-mode): 2.0 cm LEFT ATRIUM             Index       RIGHT ATRIUM           Index LA diam:        4.00 cm 2.12 cm/m  RA Area:     11.90 cm LA Vol (A2C):   75.3 ml 39.94 ml/m RA Volume:   27.30 ml  14.48 ml/m LA Vol (A4C):   45.0 ml 23.87 ml/m LA Biplane Vol: 59.3 ml 31.45 ml/m  AORTIC VALVE LVOT Vmax:   108.00 cm/s LVOT Vmean:  69.500 cm/s LVOT VTI:    0.170 m  AORTA Ao Root diam: 2.80 cm Ao Asc diam:  3.10 cm MITRAL VALVE               TRICUSPID VALVE MV Area (PHT): 4.80 cm    TR Peak grad:   40.7 mmHg MV Decel Time: 158 msec    TR Vmax:        319.00 cm/s MV E velocity: 92.10 cm/s MV A velocity: 84.80 cm/s  SHUNTS MV E/A ratio:  1.09        Systemic VTI:  0.17 m                            Systemic Diam: 1.80 cm Chilton Si MD Electronically signed by Chilton Si MD Signature Date/Time: 10/10/2020/6:46:32 PM    Final    US Abdomen Limited RUQ (LIVER/GB)  Result Date: 10/12/2020 CLINICAL DATA:  Right upper quadrant abdominal pain EXAM: ULTRASOUND ABDOMEN LIMITED RIGHT UPPER QUADRANT COMPARISON:  None. FINDINGS: Gallbladder: Multiple large small bowel shadowing stones are present. The largest measures up to 2.3 mm. Gallbladder wall is thickened, measuring up to 4.8 mm. No  sonographic Eulah Pont sign is reported. Common bile duct: Diameter: 4.0 mm, within normal limits Liver: No focal lesion identified. Within normal limits in parenchymal echogenicity. Portal vein is patent on color Doppler imaging with normal direction of blood flow towards the liver. Other: None. IMPRESSION: 1. Cholelithiasis with gallbladder wall thickening suggesting acute cholecystitis. 2. No sonographic Eulah Pont sign is reported. Electronically Signed  By: Marin Roberts M.D.   On: 10/12/2020 07:45    Labs:  CBC: Recent Labs    10/09/20 1840 10/10/20 0639 10/11/20 0037 10/12/20 0110  WBC 18.6* 13.1* 12.4* 10.6*  HGB 13.1 9.4* 9.9* 9.3*  HCT 42.4 30.2* 30.2* 29.1*  PLT 149* 116* 119* 115*    COAGS: Recent Labs    10/09/20 2317 10/12/20 0110  INR 1.5* 1.3*  APTT  --  25    BMP: Recent Labs    10/10/20 0000 10/10/20 0540 10/11/20 0037 10/12/20 0110  NA 141 139 138 138  K 4.1 4.0 3.7 4.1  CL 116* 115* 114* 109  CO2 19* 18* 19* 22  GLUCOSE 73 84 100* 166*  BUN 24* 20 10 12   CALCIUM 6.1* 6.6* 6.6* 7.9*  CREATININE 1.40* 1.10* 0.86 0.81  GFRNONAA 40* 53* >60 >60    LIVER FUNCTION TESTS: Recent Labs    05/14/20 1922 10/09/20 1840 10/10/20 0540 10/11/20 0037 10/12/20 0110  BILITOT 0.4 1.3* 0.6  --  0.8  AST 22 30 15   --  22  ALT 12 13 12   --  15  ALKPHOS 85 54 44  --  59  PROT 6.3* 5.7* 4.7*  --  5.5*  ALBUMIN 3.6 3.0* 2.5* 2.2* 3.0*    TUMOR MARKERS: No results for input(s): AFPTM, CEA, CA199, CHROMGRNA in the last 8760 hours.  Assessment and Plan: 75 y.o. female with right upper quadrant pain and fever who underwent ultrasound abdomen limited right upper quadrant on 8/24 which showed cholelithiasis with gallbladder wall thickening suggesting acute cholecystitis.  General surgery was consulted for further evaluation and management who recommended percutaneous cholecystostomy tube placement with IR to the patient.  IR was requested for image guided  percutaneous cholecystomy tube placement. Case was reviewed and approved by Dr. .  N.p.o. since midnight VS afebrile hypertensive 161/88 tachycardic HR 103 tachypnea RR 20 CBC with leukocytosis with left shift 10.6 anemia 9.3 thrombocytopenia 115 INR 1.3, subcu heparin last given on 10/12/2018 2357 hrs. Patient on Flagyl 500 mg every 8 hours, last given 1158 hrs. Today, discussed with Dr. 9/24, patient to receive cefoxitin 2 g for perc chole. Ordered.   Risks and benefits discussed with the patient including, but not limited to bleeding, infection, gallbladder perforation, bile leak, sepsis or even death.  All of the patient's questions were answered, patient is agreeable to proceed. Consent signed and in chart.   Thank you for this interesting consult.  I greatly enjoyed meeting [facility] and look forward to participating in their care.  A copy of this report was sent to the requesting provider on this date.  Electronically Signed: Fredia Sorrow, PA-C 10/12/2020, 12:24 PM   I spent a total of 20 Minutes    in face to face in clinical consultation, greater than 50% of which was counseling/coordinating care for percutaneous cholecystostomy tube placement.

## 2020-10-12 NOTE — Progress Notes (Addendum)
[facility] night shift telemetry coverage note.  The patient had a temperature of 102.5 F, she is tachycardic at 110 bpm, respirations were 25/min, oxygen saturation 92% on room air and blood pressure 156/76 mmHg. she was sitting in bed in no acute distress.  Oral mucosa are moist.  Neck is supple with no JVD.  Lungs sound diminished at the bases, but otherwise CTA.  Heart S1S2 tachycardic at 106 bpm.  Abdomen soft, mild RUQ tenderness without guarding or rebound the patient says she has had for the past 3 days.  Extremities no edema on RLE and LLE is immobilized with a knee immobilizer.  She is oriented x4 and although is having mild generalized weakness shows no focalities on gross neurological exam.  Her WBC are decreased from 12 yesterday to 10 today but lactic acid is 2.7 mmol/L.  PT was 16.1, INR 1.3 and PTT 16.1.CMP significant for hyperglycemia 166 mg/dL, hypoproteinemia/hypoalbuminemia.  Phosphorus 2.1 mg/dL.  Second lactic acid still pending.  Assessment:  Fever meeting sepsis criteria. Work-up being done per sepsis order set. Received 1000 mL of LR over 1 hour. Another 500 mL 250 mL/h have been ordered. Vancomycin, cefepime and metronidazole ordered.  Hypophosphatemia The patient is currently NPO. K-Phos 20 mmol IVPB x1 dose.   Sanda Klein, MD.

## 2020-10-12 NOTE — Procedures (Signed)
Interventional Radiology Procedure Note  Procedure: Cholecystostomy tube placement  Findings: Please refer to procedural dictation for full description. Percutaneous, transperitoneal cholecystostomy tube placement, 10.2 Fr.  Cystic duct patent on cholecystogram.  Placed to bag drainage.  Complications: None immediate  Estimated Blood Loss: < 5 mL  Recommendations: Keep to bag drainage. IR will follow. If the patient is not a candidate for cholecystectomy in the future, she would be a good candidate for Spyglass/percutaneous gallstone retrieval and eventual tube removal.  Please contact myself or our team if this is desired.   Marliss Coots, MD Pager: [phone]

## 2020-10-12 NOTE — Progress Notes (Signed)
Pharmacy Antibiotic Note  Heather [name] is a 75 y.o. female admitted on 10/09/2020 with sepsis.  Pharmacy has been consulted for vancomycin and ceefpime dosing.  Plan: Vancomycin 1gm IV 124 hours Cefepime 2gm IV q12 F/u renal function, cultures and clinical course  Height: 5\' 6"  (167.6 cm) Weight: 58.2 kg (128 lb 4.9 oz) IBW/kg (Calculated) : 59.3  Temp (24hrs), Avg:99.9 F (37.7 C), Min:97.8 F (36.6 C), Max:102.9 F (39.4 C)  Recent Labs  Lab 10/09/20 1840 10/09/20 2000 10/09/20 2317 10/10/20 0000 10/10/20 0540 10/10/20 0639 10/11/20 0037 10/12/20 0110  WBC 18.6*  --   --   --   --  13.1* 12.4* 10.6*  CREATININE 2.21*  --   --  1.40* 1.10*  --  0.86 0.81  LATICACIDVEN  --  1.0 0.9  --   --   --   --  2.7*    Estimated Creatinine Clearance: 56.8 mL/min (by C-G formula based on SCr of 0.81 mg/dL).    Allergies  Allergen Reactions   Augmentin [Amoxicillin-Pot Clavulanate]     Has tolerated Ancef    Thank you for allowing pharmacy to be a part of this patient's care.  10/14/20 10/12/2020 2:37 AM

## 2020-10-12 NOTE — Consult Note (Signed)
Heather [name] 11-Aug-1945  [id number].    Requesting MD: Dr. Lacretia Nicks Chief Complaint/Reason for Consult: cholecystitis   HPI:  This is a very pleasant 75 yo black female with a history of HTN, HLD who wasn't feeling well on Saturday or Sunday.  She wasn't having any abdominal pain at the time but was having nausea and 1 episode of emesis on Saturday.  She has not been eating or drinking much due to the nausea.  She was walking in the hall with her daughter when she fell once and then a second time at which point she fractured her L femur.  She was brought in the the ED and see by ortho.  Her troponins were also elevated and she was felt to have an NSTEMI secondary to stress, Takosubo type response.  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  She was scheduled for surgery today for her femur, but developed RUQ abdominal pain yesterday and spiked a temp overnight of 102.  She underwent a RUQ [place] that revealed cholecystitis.  She was started on abx therapy.  Her LFTs are unremarkable. She otherwise c/o pain in her left leg, but denies CP, SOB, dysuria (has foley in place), or any other symptoms. We have been asked to see for further evaluation and recommendations.   ROS: ROS: Please see HPI, otherwise all other systems have been reviewed and are negative except for pain from her left femur.    Family History  Problem Relation Age of Onset   Hypertension Other     Past Medical History:  Diagnosis Date   Anxiety    Closed right ankle fracture    Complication of anesthesia    Depression    Headache    Hyperlipidemia    Hypertension    IBS (irritable bowel syndrome)    PONV (postoperative nausea and vomiting)    Seasonal allergies     Past Surgical History:  Procedure Laterality Date   APPENDECTOMY     JOINT REPLACEMENT Bilateral    TKA   LEFT HEART CATH AND CORONARY ANGIOGRAPHY N/A  10/11/2020   Procedure: LEFT HEART CATH AND CORONARY ANGIOGRAPHY;  Surgeon: Swaziland, Peter M, MD;  Location: [facility] INVASIVE CV LAB;  Service: Cardiovascular;  Laterality: N/A;   LUMBAR LAMINECTOMY/DECOMPRESSION MICRODISCECTOMY N/A 05/15/2020   Procedure: LUMBAR LAMINECTOMY/DECOMPRESSION OF Lumbar three;  Surgeon: Maeola Harman, MD;  Location: [facility] OR;  Service: Neurosurgery;  Laterality: N/A;   LUMBAR PERCUTANEOUS PEDICLE SCREW 2 LEVEL N/A 05/15/2020   Procedure: LUMBAR PERCUTANEOUS SCREW, Lumbar one - Lumbar two, Lumbar four - Lumbar five;  Surgeon: Maeola Harman, MD;  [facility];  Service: Neurosurgery;  Laterality: N/A;   ORIF ANKLE FRACTURE Right 06/05/2018   Procedure: OPEN REDUCTION INTERNAL FIXATION (ORIF) ANKLE FRACTURE;  Surgeon: Sheral Apley, MD;  Location: [facility];  Service: Orthopedics;  Laterality: Right;    Social History:  reports that she has quit smoking. She has never used smokeless tobacco. She reports that she does not currently use alcohol. She reports that she does not use drugs.  Allergies:  Allergies  Allergen Reactions   Augmentin [Amoxicillin-Pot Clavulanate]     Has tolerated Ancef    Medications Prior to Admission  Medication Sig Dispense Refill   albuterol (VENTOLIN HFA) 108 (90 Base) MCG/ACT inhaler Inhale 2 puffs into the lungs every 6 (six) hours as needed for wheezing or shortness  of breath.     buPROPion (WELLBUTRIN XL) 300 MG 24 hr tablet Take 300 mg by mouth daily.     celecoxib (CELEBREX) 200 MG capsule Take 200 mg by mouth 2 (two) times daily.     dicyclomine (BENTYL) 20 MG tablet Take 20 mg by mouth every 6 (six) hours as needed for spasms.     fluticasone (FLONASE) 50 MCG/ACT nasal spray Place 1 spray into both nostrils daily.     gabapentin (NEURONTIN) 300 MG capsule Take 300-600 mg by mouth 2 (two) times daily. Take 1 capsule (300 mg) in the morning & Take 2 capsules (600 mg) at bedtime     HYDROcodone-acetaminophen  (NORCO/VICODIN) 5-325 MG tablet Take 1-2 tablets by mouth every 4 (four) hours as needed for severe pain ((score 7 to 10)). 30 tablet 0   methocarbamol (ROBAXIN) 500 MG tablet Take 500 mg by mouth every 12 (twelve) hours as needed for muscle spasms.     montelukast (SINGULAIR) 10 MG tablet Take 10 mg by mouth at bedtime.     omeprazole (PRILOSEC) 40 MG capsule Take 1 capsule (40 mg total) by mouth daily. 60 capsule 2   ondansetron (ZOFRAN-ODT) 4 MG disintegrating tablet Take 4 mg by mouth every 8 (eight) hours as needed for nausea or vomiting.     rosuvastatin (CRESTOR) 10 MG tablet Take 10 mg by mouth daily.     tacrolimus (PROTOPIC) 0.03 % ointment Apply 1 application topically 2 (two) times daily.     traZODone (DESYREL) 150 MG tablet Take 150 mg by mouth at bedtime.     valsartan (DIOVAN) 80 MG tablet Take 1 tablet (80 mg total) by mouth daily. 30 tablet 11     Physical Exam: Blood pressure (!) 143/81, pulse 98, temperature 98.5 F (36.9 C), temperature source Oral, resp. rate (!) 21, height  (1.676 m), weight 58.2 kg, SpO2 100 %. General: pleasant, WD, WN black female who is laying in bed in NAD HEENT: head is normocephalic, atraumatic.  Sclera are noninjected.  PERRL.  Ears and nose without any masses or lesions.  Mouth is pink and moist Heart: regular, rate, and rhythm.  Normal s1,s2. No obvious murmurs, gallops, or rubs noted.  Palpable radial and pedal pulses bilaterally Lungs: CTAB, no wheezes, rhonchi, or rales noted.  Respiratory effort nonlabored Abd: soft, tender in RUQ with + Murphy's sig, ND, +BS, no masses, hernias, or organomegaly.  Right paramedian scar from prior appendectomy MS: all 4 extremities are symmetrical with no cyanosis, clubbing, or edema, except LLE with KI in place. Skin: warm and dry with no masses, lesions, or rashes Neuro: Cranial nerves 2-12 grossly intact, sensation is normal throughout Psych: A&Ox3 with an appropriate affect.   Results for orders  placed or performed during the hospital encounter of 10/09/20 (from the past 48 hour(s))  Heparin level (unfractionated)     Status: Abnormal   Collection Time: 10/10/20 12:26 PM  Result Value Ref Range   Heparin Unfractionated <0.10 (L) 0.30 - 0.70 IU/mL    Comment: (NOTE) The clinical reportable range upper limit is being lowered to >1.10 to align with the FDA approved guidance for the current laboratory assay.  If heparin results are below expected values, and patient dosage has  been confirmed, suggest follow up testing of antithrombin III levels. Performed at [facility], [address] [place], [place] [address]   MRSA Next Gen by PCR, Nasal     Status: None   Collection  Time: 10/10/20  4:17 PM   Specimen: Nasal Mucosa; Nasal Swab  Result Value Ref Range   MRSA by PCR Next Gen NOT DETECTED NOT DETECTED    Comment: (NOTE) The GeneXpert MRSA Assay (FDA approved for NASAL specimens only), is one component of a comprehensive MRSA colonization surveillance program. It is not intended to diagnose MRSA infection nor to guide or monitor treatment for MRSA infections. Test performance is not FDA approved in patients less than 23 years old. Performed at [facility], [address] [place], [place] [address]   Heparin level (unfractionated)     Status: Abnormal   Collection Time: 10/10/20 10:11 PM  Result Value Ref Range   Heparin Unfractionated 0.23 (L) 0.30 - 0.70 IU/mL    Comment: (NOTE) The clinical reportable range upper limit is being lowered to >1.10 to align with the FDA approved guidance for the current laboratory assay.  If heparin results are below expected values, and patient dosage has  been confirmed, suggest follow up testing of antithrombin III levels. Performed at [facility], [address] [place], [place] [address]   Procalcitonin     Status: None   Collection Time: 10/11/20 12:37 AM  Result Value Ref Range    Procalcitonin 0.10 ng/mL    Comment:        Interpretation: PCT (Procalcitonin) <= 0.5 ng/mL: Systemic infection (sepsis) is not likely. Local bacterial infection is possible. (NOTE)       Sepsis PCT Algorithm           Lower Respiratory Tract                                      Infection PCT Algorithm    ----------------------------     ----------------------------         PCT < 0.25 ng/mL                PCT < 0.10 ng/mL          Strongly encourage             Strongly discourage   discontinuation of antibiotics    initiation of antibiotics    ----------------------------     -----------------------------       PCT 0.25 - 0.50 ng/mL            PCT 0.10 - 0.25 ng/mL               OR       >80% decrease in PCT            Discourage initiation of                                            antibiotics      Encourage discontinuation           of antibiotics    ----------------------------     -----------------------------         PCT >= 0.50 ng/mL              PCT 0.26 - 0.50 ng/mL               AND        <80% decrease in PCT  Encourage initiation of                                             antibiotics       Encourage continuation           of antibiotics    ----------------------------     -----------------------------        PCT >= 0.50 ng/mL                  PCT > 0.50 ng/mL               AND         increase in PCT                  Strongly encourage                                      initiation of antibiotics    Strongly encourage escalation           of antibiotics                                     -----------------------------                                           PCT <= 0.25 ng/mL                                                 OR                                        > 80% decrease in PCT                                      Discontinue / Do not initiate                                             antibiotics  Performed at [facility]  Lab, [address], [place], [place] [address]   CBC     Status: Abnormal   Collection Time: 10/11/20 12:37 AM  Result Value Ref Range   WBC 12.4 (H) 4.0 - 10.5 K/uL   RBC 3.60 (L) 3.87 - 5.11 MIL/uL   Hemoglobin 9.9 (L) 12.0 - 15.0 g/dL   HCT 69.6 (L) 29.5 - 28.4 %   MCV 83.9 80.0 - 100.0 fL   MCH 27.5 26.0 - 34.0 pg   MCHC 32.8 30.0 - 36.0 g/dL   RDW 13.2 (H) 44.0 - 10.2 %   Platelets 119 (L) 150 - 400 K/uL    Comment: SPECIMEN CHECKED FOR CLOTS Immature Platelet Fraction  may be clinically indicated, consider ordering this additional test [id number] REPEATED TO VERIFY PLATELET COUNT CONFIRMED BY SMEAR PLATELET CLUMPS NOTED ON SMEAR, COUNT APPEARS ADEQUATE    nRBC 0.0 0.0 - 0.2 %    Comment: Performed at [facility], [address] [place], [place] [address]  Renal function panel     Status: Abnormal   Collection Time: 10/11/20 12:37 AM  Result Value Ref Range   Sodium 138 135 - 145 mmol/L   Potassium 3.7 3.5 - 5.1 mmol/L   Chloride 114 (H) 98 - 111 mmol/L   CO2 19 (L) 22 - 32 mmol/L   Glucose, Bld 100 (H) 70 - 99 mg/dL    Comment: Glucose reference range applies only to samples taken after fasting for at least 8 hours.   BUN 10 8 - 23 mg/dL   Creatinine, Ser 3.22 0.44 - 1.00 mg/dL   Calcium 6.6 (L) 8.9 - 10.3 mg/dL   Phosphorus 1.4 (L) 2.5 - 4.6 mg/dL   Albumin 2.2 (L) 3.5 - 5.0 g/dL   GFR, Estimated >02 >54 mL/min    Comment: (NOTE) Calculated using the CKD-EPI Creatinine Equation (2021)    Anion gap 5 5 - 15    Comment: Performed at [facility], [address] [place], [place] [address]  Troponin I (High Sensitivity)     Status: Abnormal   Collection Time: 10/11/20 12:37 AM  Result Value Ref Range   Troponin I (High Sensitivity) 239 (HH) <18 ng/L    Comment: CRITICAL RESULT CALLED TO, READ BACK BY AND VERIFIED WITH:  D. HAWTHORNE RN @[id number]  10/11/20 K. SANDERS Performed at [facility], [address], [place], [place]   Heparin level  (unfractionated)     Status: Abnormal   Collection Time: 10/11/20  6:28 AM  Result Value Ref Range   Heparin Unfractionated 0.17 (L) 0.30 - 0.70 IU/mL    Comment: (NOTE) The clinical reportable range upper limit is being lowered to >1.10 to align with the FDA approved guidance for the current laboratory assay.  If heparin results are below expected values, and patient dosage has  been confirmed, suggest follow up testing of antithrombin III levels. Performed at [facility], [address] [place], [place]   Magnesium     Status: None   Collection Time: 10/12/20  1:10 AM  Result Value Ref Range   Magnesium 1.9 1.7 - 2.4 mg/dL    Comment: Performed at [facility], [address] [place], [place]  Phosphorus     Status: Abnormal   Collection Time: 10/12/20  1:10 AM  Result Value Ref Range   Phosphorus 2.1 (L) 2.5 - 4.6 mg/dL    Comment: Performed at [facility], [address], [place], [place]  CBC with Differential     Status: Abnormal   Collection Time: 10/12/20  1:10 AM  Result Value Ref Range   WBC 10.6 (H) 4.0 - 10.5 K/uL   RBC 3.47 (L) 3.87 - 5.11 MIL/uL   Hemoglobin 9.3 (L) 12.0 - 15.0 g/dL   HCT 10/14/20 (L) 26.9 - 48.5 %   MCV 83.9 80.0 - 100.0 fL   MCH 26.8 26.0 - 34.0 pg   MCHC 32.0 30.0 - 36.0 g/dL   RDW 46.2 (H) 70.3 - 50.0 %   Platelets 115 (L) 150 - 400 K/uL    Comment: REPEATED TO VERIFY PLATELET COUNT CONFIRMED BY SMEAR  nRBC 0.0 0.0 - 0.2 %   Neutrophils Relative % 80 %   Neutro Abs 8.4 (H) 1.7 - 7.7 K/uL   Lymphocytes Relative 14 %   Lymphs Abs 1.4 0.7 - 4.0 K/uL   Monocytes Relative 5 %   Monocytes Absolute 0.6 0.1 - 1.0 K/uL   Eosinophils Relative 1 %   Eosinophils Absolute 0.1 0.0 - 0.5 K/uL   Basophils Relative 0 %   Basophils Absolute 0.0 0.0 - 0.1 K/uL   WBC Morphology MORPHOLOGY UNREMARKABLE    RBC Morphology MORPHOLOGY UNREMARKABLE    Smear Review PLATELET COUNT CONFIRMED BY SMEAR     Immature Granulocytes 0 %   Abs Immature Granulocytes 0.03 0.00 - 0.07 K/uL    Comment: Performed at [facility], [address] [place], [place] [address]  Comprehensive metabolic panel     Status: Abnormal   Collection Time: 10/12/20  1:10 AM  Result Value Ref Range   Sodium 138 135 - 145 mmol/L   Potassium 4.1 3.5 - 5.1 mmol/L   Chloride 109 98 - 111 mmol/L   CO2 22 22 - 32 mmol/L   Glucose, Bld 166 (H) 70 - 99 mg/dL    Comment: Glucose reference range applies only to samples taken after fasting for at least 8 hours.   BUN 12 8 - 23 mg/dL   Creatinine, Ser 6.04 0.44 - 1.00 mg/dL   Calcium 7.9 (L) 8.9 - 10.3 mg/dL   Total Protein 5.5 (L) 6.5 - 8.1 g/dL   Albumin 3.0 (L) 3.5 - 5.0 g/dL   AST 22 15 - 41 U/L   ALT 15 0 - 44 U/L   Alkaline Phosphatase 59 38 - 126 U/L   Total Bilirubin 0.8 0.3 - 1.2 mg/dL   GFR, Estimated >54 >09 mL/min    Comment: (NOTE) Calculated using the CKD-EPI Creatinine Equation (2021)    Anion gap 7 5 - 15    Comment: Performed at [facility], [address], [place], [place] [address]  Lactic acid, plasma     Status: Abnormal   Collection Time: 10/12/20  1:10 AM  Result Value Ref Range   Lactic Acid, Venous 2.7 (HH) 0.5 - 1.9 mmol/L    Comment: CRITICAL RESULT CALLED TO, READ BACK BY AND VERIFIED WITH:  G. [facility] RN  10/12/20 K. SANDERS Performed at [facility], [address], [place], [place] [address]   Protime-INR     Status: Abnormal   Collection Time: 10/12/20  1:10 AM  Result Value Ref Range   Prothrombin Time 16.1 (H) 11.4 - 15.2 seconds   INR 1.3 (H) 0.8 - 1.2    Comment: (NOTE) INR goal varies based on device and disease states. Performed at [facility], [address], [place], [place] [address]   APTT     Status: None   Collection Time: 10/12/20  1:10 AM  Result Value Ref Range   aPTT 25 24 - 36 seconds    Comment: Performed at [facility], [address], [place], [place] [address]   Procalcitonin     Status: None   Collection Time: 10/12/20  1:10 AM  Result Value Ref Range   Procalcitonin <0.10 ng/mL    Comment:        Interpretation: PCT (Procalcitonin) <= 0.5 ng/mL: Systemic infection (sepsis) is not likely. Local bacterial infection is possible. (NOTE)       Sepsis PCT Algorithm  Lower Respiratory Tract                                      Infection PCT Algorithm    ----------------------------     ----------------------------         PCT < 0.25 ng/mL                PCT < 0.10 ng/mL          Strongly encourage             Strongly discourage   discontinuation of antibiotics    initiation of antibiotics    ----------------------------     -----------------------------       PCT 0.25 - 0.50 ng/mL            PCT 0.10 - 0.25 ng/mL               OR       >80% decrease in PCT            Discourage initiation of                                            antibiotics      Encourage discontinuation           of antibiotics    ----------------------------     -----------------------------         PCT >= 0.50 ng/mL              PCT 0.26 - 0.50 ng/mL               AND        <80% decrease in PCT             Encourage initiation of                                             antibiotics       Encourage continuation           of antibiotics    ----------------------------     -----------------------------        PCT >= 0.50 ng/mL                  PCT > 0.50 ng/mL               AND         increase in PCT                  Strongly encourage                                      initiation of antibiotics    Strongly encourage escalation           of antibiotics                                     -----------------------------  PCT <= 0.25 ng/mL                                                 OR                                        > 80% decrease in PCT                                      Discontinue / Do not  initiate                                             antibiotics  Performed at [facility], [address], [place], [place] [address]   Type and screen [facility]     Status: None (Preliminary result)   Collection Time: 10/12/20  8:45 AM  Result Value Ref Range   ABO/RH(D) PENDING    Antibody Screen PENDING    Sample Expiration      10/15/2020,2359 Performed at [facility], [address], [place], [place] [address]    NM Pulmonary Perfusion  Result Date: 10/10/2020 CLINICAL DATA:  Pulmonary emboli suspected, high probability. Shortness of breath over the last 3 days. EXAM: NUCLEAR MEDICINE PERFUSION LUNG SCAN TECHNIQUE: Perfusion images were obtained in multiple projections after intravenous injection of radiopharmaceutical. Ventilation scans intentionally deferred if perfusion scan and chest x-ray adequate for interpretation during COVID 19 epidemic. RADIOPHARMACEUTICALS:  4.4 mCi Tc-8m MAA IV COMPARISON:  Chest radiography yesterday. FINDINGS: Negative perfusion study.  No evidence of focal defect. IMPRESSION: Negative/normal nuclear medicine pulmonary perfusion study. No visible defect. Electronically Signed   By: Paulina Fusi M.D.   On: 10/10/2020 11:43   CARDIAC CATHETERIZATION  Result Date: 10/11/2020   Prox LAD lesion is 30% stenosed.   Prox RCA to Mid RCA lesion is 20% stenosed.   LV end diastolic pressure is normal. Minimal nonobstructive CAD Normal LVEDP Plan: medical management.   DG Chest Port 1 View  Result Date: 10/12/2020 CLINICAL DATA:  Sepsis. EXAM: PORTABLE CHEST 1 VIEW COMPARISON:  Chest radiograph dated 10/09/2020. FINDINGS: There is cardiomegaly with vascular congestion. No focal consolidation, pleural effusion or pneumothorax. Degenerative changes of the spine. No acute osseous pathology. Partially visualized right shoulder arthroplasty. IMPRESSION: Cardiomegaly with vascular congestion. No focal consolidation. Electronically Signed    By: Elgie Collard M.D.   On: 10/12/2020 01:33   ECHOCARDIOGRAM COMPLETE  Result Date: 10/10/2020    ECHOCARDIOGRAM REPORT   Patient Name:   Heather [name] Date of Exam: 10/10/2020 Medical Rec #:  [id number]              Height:       63.0 in Accession #:    [id number]             Weight:       188.5 lb Date of Birth:  09/26/1945             BSA:          1.886 m  Patient Age:    74 years               BP:           140/71 mmHg Patient Gender: F                      HR:           93 bpm. Exam Location:  Inpatient Procedure: 2D Echo Indications:    elevated troponin  History:        Patient has no prior history of Echocardiogram examinations.                 Signs/Symptoms:elevated troponin; Risk Factors:Dyslipidemia and                 Hypertension.  Sonographer:    Delcie Roch RDCS Referring Phys: [id number] ANASTASSIA DOUTOVA IMPRESSIONS  1. Mild mid-apical anterolateral and posterolateral hypokinesis. Left ventricular ejection fraction, by estimation, is 50 to 55%. The left ventricle has low normal function. The left ventricle demonstrates regional wall motion abnormalities (see scoring  diagram/findings for description). There is mild concentric left ventricular hypertrophy. Left ventricular diastolic parameters are consistent with Grade II diastolic dysfunction (pseudonormalization). Elevated left ventricular end-diastolic pressure.  2. Right ventricular systolic function is normal. The right ventricular size is normal. There is mildly elevated pulmonary artery systolic pressure.  3. Left atrial size was mildly dilated.  4. The mitral valve is normal in structure. No evidence of mitral valve regurgitation. No evidence of mitral stenosis.  5. The aortic valve is tricuspid. Aortic valve regurgitation is not visualized. No aortic stenosis is present.  6. The inferior vena cava is normal in size with greater than 50% respiratory variability, suggesting right atrial pressure of 3 mmHg. FINDINGS  Left  Ventricle: Mild mid-apical anterolateral and posterolateral hypokinesis. Left ventricular ejection fraction, by estimation, is 50 to 55%. The left ventricle has low normal function. The left ventricle demonstrates regional wall motion abnormalities. The left ventricular internal cavity size was normal in size. There is mild concentric left ventricular hypertrophy. Left ventricular diastolic parameters are consistent with Grade II diastolic dysfunction (pseudonormalization). Elevated left ventricular end-diastolic pressure. Right Ventricle: The right ventricular size is normal. No increase in right ventricular wall thickness. Right ventricular systolic function is normal. There is mildly elevated pulmonary artery systolic pressure. The tricuspid regurgitant velocity is 3.19  m/s, and with an assumed right atrial pressure of 3 mmHg, the estimated right ventricular systolic pressure is 43.7 mmHg. Left Atrium: Left atrial size was mildly dilated. Right Atrium: Right atrial size was normal in size. Pericardium: There is no evidence of pericardial effusion. Mitral Valve: The mitral valve is normal in structure. No evidence of mitral valve regurgitation. No evidence of mitral valve stenosis. Tricuspid Valve: The tricuspid valve is normal in structure. Tricuspid valve regurgitation is trivial. No evidence of tricuspid stenosis. Aortic Valve: The aortic valve is tricuspid. Aortic valve regurgitation is not visualized. No aortic stenosis is present. Pulmonic Valve: The pulmonic valve was normal in structure. Pulmonic valve regurgitation is mild. No evidence of pulmonic stenosis. Aorta: The aortic root is normal in size and structure. Venous: The inferior vena cava is normal in size with greater than 50% respiratory variability, suggesting right atrial pressure of 3 mmHg. IAS/Shunts: No atrial level shunt detected by color flow Doppler.  LEFT VENTRICLE PLAX 2D LVIDd:         4.10 cm     Diastology LVIDs:  2.40 cm     LV  e' medial:    6.53 cm/s LV PW:         1.20 cm     LV E/e' medial:  14.1 LV IVS:        1.06 cm     LV e' lateral:   9.36 cm/s LVOT diam:     1.80 cm     LV E/e' lateral: 9.8 LV SV:         43 LV SV Index:   23 LVOT Area:     2.54 cm  LV Volumes (MOD) LV vol d, MOD A4C: 71.5 ml LV vol s, MOD A4C: 35.7 ml LV SV MOD A4C:     71.5 ml RIGHT VENTRICLE             IVC RV S prime:     11.20 cm/s  IVC diam: 1.70 cm TAPSE (M-mode): 2.0 cm LEFT ATRIUM             Index       RIGHT ATRIUM           Index LA diam:        4.00 cm 2.12 cm/m  RA Area:     11.90 cm LA Vol (A2C):   75.3 ml 39.94 ml/m RA Volume:   27.30 ml  14.48 ml/m LA Vol (A4C):   45.0 ml 23.87 ml/m LA Biplane Vol: 59.3 ml 31.45 ml/m  AORTIC VALVE LVOT Vmax:   108.00 cm/s LVOT Vmean:  69.500 cm/s LVOT VTI:    0.170 m  AORTA Ao Root diam: 2.80 cm Ao Asc diam:  3.10 cm MITRAL VALVE               TRICUSPID VALVE MV Area (PHT): 4.80 cm    TR Peak grad:   40.7 mmHg MV Decel Time: 158 msec    TR Vmax:        319.00 cm/s MV E velocity: 92.10 cm/s MV A velocity: 84.80 cm/s  SHUNTS MV E/A ratio:  1.09        Systemic VTI:  0.17 m                            Systemic Diam: 1.80 cm Chilton Si MD Electronically signed by Chilton Si MD Signature Date/Time: 10/10/2020/6:46:32 PM    Final    US Abdomen Limited RUQ (LIVER/GB)  Result Date: 10/12/2020 CLINICAL DATA:  Right upper quadrant abdominal pain EXAM: ULTRASOUND ABDOMEN LIMITED RIGHT UPPER QUADRANT COMPARISON:  None. FINDINGS: Gallbladder: Multiple large small bowel shadowing stones are present. The largest measures up to 2.3 mm. Gallbladder wall is thickened, measuring up to 4.8 mm. No sonographic Eulah Pont sign is reported. Common bile duct: Diameter: 4.0 mm, within normal limits Liver: No focal lesion identified. Within normal limits in parenchymal echogenicity. Portal vein is patent on color Doppler imaging with normal direction of blood flow towards the liver. Other: None. IMPRESSION: 1.  Cholelithiasis with gallbladder wall thickening suggesting acute cholecystitis. 2. No sonographic Eulah Pont sign is reported. Electronically Signed   By: Marin Roberts M.D.   On: 10/12/2020 07:45      Assessment/Plan Acute cholecystitis The patient appears to have cholecystitis on her [place] and clinically.  She is very tender in her RUQ.  I'm query that the gallbladder has been a problem for several days as she was having a lot of nausea and some emesis over the weekend  making her weak and leading to her fall.  Agree with abx therapy.  Can likely narrow to cefipime/flagyl as she has a pcn allergy noted.  Given the patient was found to have an NSTEMI this admission, despite non obstructive disease, we will plan for IR placement of a percutaneous cholecystostomy tube for symptom control.  We will defer lap chole at this time as infiltration of pneumoperitoneum causes decrease venous return to the heart causing significant stress on the heart.  Given we have a safe alternative after an acute stress event, we will plan on drain placement with interval lap chole in 6-8 weeks likely after she has a chance to recovery from the medical side of things as well as her femur surgery.  I have discussed this with the patient who is agreeable, will call her daughter, and discussed with primary service as well as ortho and cards.  As soon as drain can be placed by IR with source control, then ortho can proceed with fixation of her femur so she can begin to mobilize.  FEN - NPO/IVFs VTE - heparin 5000 units TID ID - Cefipime/vanc, discussed de-escalation to cefipime/flagyl given a possible noted PCN allergry   SVT - on BB, per cards NSTEMI - per primary/cards L femur fx after fall - per ortho HTN HLD AKI - resolved Thrombocytopenia - plts 115   Letha Cape, [facility] Surgery 10/12/2020, 10:13 AM Please see Amion for pager number during day hours 7:00am-4:30pm or 7:00am -11:30am on  weekends

## 2020-10-13 ENCOUNTER — Inpatient Hospital Stay (HOSPITAL_COMMUNITY): Payer: Medicare HMO

## 2020-10-13 ENCOUNTER — Inpatient Hospital Stay (HOSPITAL_COMMUNITY): Payer: Medicare HMO | Admitting: Certified Registered"

## 2020-10-13 ENCOUNTER — Other Ambulatory Visit: Payer: Self-pay | Admitting: Physician Assistant

## 2020-10-13 ENCOUNTER — Encounter (HOSPITAL_COMMUNITY)
Admission: EM | Disposition: A | Discharge status: Rehabilitation Facility | Payer: Self-pay | Source: Home / Self Care | Attending: Family Medicine

## 2020-10-13 DIAGNOSIS — R9431 Abnormal electrocardiogram [ECG] [EKG]: Secondary | ICD-10-CM | POA: Diagnosis not present

## 2020-10-13 DIAGNOSIS — I519 Heart disease, unspecified: Secondary | ICD-10-CM | POA: Diagnosis not present

## 2020-10-13 DIAGNOSIS — I10 Essential (primary) hypertension: Secondary | ICD-10-CM | POA: Diagnosis not present

## 2020-10-13 DIAGNOSIS — S72402A Unspecified fracture of lower end of left femur, initial encounter for closed fracture: Secondary | ICD-10-CM | POA: Diagnosis not present

## 2020-10-13 DIAGNOSIS — I5181 Takotsubo syndrome: Secondary | ICD-10-CM

## 2020-10-13 DIAGNOSIS — R778 Other specified abnormalities of plasma proteins: Secondary | ICD-10-CM | POA: Diagnosis not present

## 2020-10-13 HISTORY — PX: ORIF FEMUR FRACTURE: SHX2119

## 2020-10-13 LAB — CBC
HCT: 25.8 % — ABNORMAL LOW (ref 36.0–46.0)
Hemoglobin: 8.4 g/dL — ABNORMAL LOW (ref 12.0–15.0)
MCH: 27 pg (ref 26.0–34.0)
MCHC: 32.6 g/dL (ref 30.0–36.0)
MCV: 83 fL (ref 80.0–100.0)
Platelets: 127 10*3/uL — ABNORMAL LOW (ref 150–400)
RBC: 3.11 MIL/uL — ABNORMAL LOW (ref 3.87–5.11)
RDW: 17.8 % — ABNORMAL HIGH (ref 11.5–15.5)
WBC: 10.9 10*3/uL — ABNORMAL HIGH (ref 4.0–10.5)
nRBC: 0 % (ref 0.0–0.2)

## 2020-10-13 LAB — CULTURE, BLOOD (ROUTINE X 2)

## 2020-10-13 SURGERY — OPEN REDUCTION INTERNAL FIXATION (ORIF) DISTAL FEMUR FRACTURE
Anesthesia: General | Laterality: Left

## 2020-10-13 MED ORDER — AMISULPRIDE (ANTIEMETIC) 5 MG/2ML IV SOLN: 10.0000 mg | Freq: Once | INTRAVENOUS | Status: DC | PRN

## 2020-10-13 MED ORDER — METOPROLOL SUCCINATE ER 50 MG PO TB24: 50.0000 mg | ORAL_TABLET | Freq: Every day | ORAL | Status: DC

## 2020-10-13 MED ORDER — OXYCODONE HCL 5 MG/5ML PO SOLN: 5.0000 mg | Freq: Once | ORAL | Status: DC | PRN

## 2020-10-13 MED ORDER — HEPARIN SODIUM (PORCINE) 5000 UNIT/ML IJ SOLN: 5000.0000 [IU] | Freq: Three times a day (TID) | INTRAMUSCULAR | Status: DC

## 2020-10-13 MED ORDER — HYDROCODONE-ACETAMINOPHEN 7.5-325 MG PO TABS
1.0000 | ORAL_TABLET | ORAL | Status: DC | PRN
  Administered 2020-10-13 (×3): 2 via ORAL
  Filled 2020-10-13 (×3): qty 2

## 2020-10-13 MED ORDER — ALUM & MAG HYDROXIDE-SIMETH 200-200-20 MG/5ML PO SUSP
30.0000 mL | ORAL | Status: DC | PRN
  Administered 2020-10-13: 30 mL via ORAL
  Filled 2020-10-13: qty 30

## 2020-10-13 MED ORDER — FENTANYL CITRATE (PF) 100 MCG/2ML IJ SOLN: 25.0000 ug | INTRAMUSCULAR | Status: DC | PRN

## 2020-10-13 MED ORDER — LACTATED RINGERS IV SOLN: INTRAVENOUS | Status: DC

## 2020-10-13 MED ORDER — CHLORHEXIDINE GLUCONATE 0.12 % MT SOLN
OROMUCOSAL | Status: AC
  Administered 2020-10-13: 15 mL via OROMUCOSAL
  Filled 2020-10-13: qty 15

## 2020-10-13 MED ORDER — LIDOCAINE 2% (20 MG/ML) 5 ML SYRINGE
INTRAMUSCULAR | Status: DC | PRN
  Administered 2020-10-13: 60 mg via INTRAVENOUS

## 2020-10-13 MED ORDER — DOCUSATE SODIUM 100 MG PO CAPS
100.0000 mg | ORAL_CAPSULE | Freq: Two times a day (BID) | ORAL | Status: DC
  Administered 2020-10-13 – 2020-10-17 (×5): 100 mg via ORAL
  Filled 2020-10-13 (×7): qty 1

## 2020-10-13 MED ORDER — FENTANYL CITRATE (PF) 100 MCG/2ML IJ SOLN
INTRAMUSCULAR | Status: AC
  Filled 2020-10-13: qty 2

## 2020-10-13 MED ORDER — ENOXAPARIN SODIUM 40 MG/0.4ML IJ SOSY
40.0000 mg | PREFILLED_SYRINGE | INTRAMUSCULAR | Status: DC
  Administered 2020-10-14 – 2020-10-18 (×5): 40 mg via SUBCUTANEOUS
  Filled 2020-10-13 (×5): qty 0.4

## 2020-10-13 MED ORDER — FENTANYL CITRATE (PF) 250 MCG/5ML IJ SOLN
INTRAMUSCULAR | Status: DC | PRN
  Administered 2020-10-13: 100 ug via INTRAVENOUS
  Administered 2020-10-13 (×3): 50 ug via INTRAVENOUS

## 2020-10-13 MED ORDER — SACUBITRIL-VALSARTAN 24-26 MG PO TABS: 1.0000 | ORAL_TABLET | Freq: Two times a day (BID) | ORAL | Status: DC

## 2020-10-13 MED ORDER — POLYETHYLENE GLYCOL 3350 17 G PO PACK
17.0000 g | PACK | Freq: Two times a day (BID) | ORAL | Status: DC
  Administered 2020-10-17: 17 g via ORAL
  Filled 2020-10-13 (×3): qty 1

## 2020-10-13 MED ORDER — POLYETHYLENE GLYCOL 3350 17 G PO PACK: 17.0000 g | PACK | Freq: Every day | ORAL | Status: DC | PRN

## 2020-10-13 MED ORDER — METOPROLOL SUCCINATE ER 50 MG PO TB24
50.0000 mg | ORAL_TABLET | Freq: Every day | ORAL | Status: DC
  Administered 2020-10-13 – 2020-10-18 (×6): 50 mg via ORAL
  Filled 2020-10-13 (×6): qty 1

## 2020-10-13 MED ORDER — SPIRONOLACTONE 12.5 MG HALF TABLET
12.5000 mg | ORAL_TABLET | Freq: Every day | ORAL | Status: DC
  Administered 2020-10-13 – 2020-10-18 (×6): 12.5 mg via ORAL
  Filled 2020-10-13 (×6): qty 1

## 2020-10-13 MED ORDER — SACUBITRIL-VALSARTAN 24-26 MG PO TABS
1.0000 | ORAL_TABLET | Freq: Two times a day (BID) | ORAL | Status: DC
  Administered 2020-10-13 – 2020-10-18 (×11): 1 via ORAL
  Filled 2020-10-13 (×11): qty 1

## 2020-10-13 MED ORDER — VANCOMYCIN HCL 1000 MG IV SOLR
INTRAVENOUS | Status: AC
  Filled 2020-10-13: qty 20

## 2020-10-13 MED ORDER — METOCLOPRAMIDE HCL 5 MG/ML IJ SOLN: 5.0000 mg | Freq: Three times a day (TID) | INTRAMUSCULAR | Status: DC | PRN

## 2020-10-13 MED ORDER — VANCOMYCIN HCL 1000 MG IV SOLR
INTRAVENOUS | Status: DC | PRN
  Administered 2020-10-13: 1000 mg

## 2020-10-13 MED ORDER — FENTANYL CITRATE (PF) 100 MCG/2ML IJ SOLN
25.0000 ug | INTRAMUSCULAR | Status: DC | PRN
  Administered 2020-10-13 (×2): 50 ug via INTRAVENOUS

## 2020-10-13 MED ORDER — PROPOFOL 10 MG/ML IV BOLUS
INTRAVENOUS | Status: AC
  Filled 2020-10-13: qty 20

## 2020-10-13 MED ORDER — MORPHINE SULFATE (PF) 2 MG/ML IV SOLN
0.5000 mg | INTRAVENOUS | Status: DC | PRN
  Administered 2020-10-13 – 2020-10-15 (×8): 1 mg via INTRAVENOUS
  Filled 2020-10-13 (×8): qty 1

## 2020-10-13 MED ORDER — PHENYLEPHRINE HCL-NACL 20-0.9 MG/250ML-% IV SOLN
INTRAVENOUS | Status: DC | PRN
  Administered 2020-10-13: 25 ug/min via INTRAVENOUS

## 2020-10-13 MED ORDER — HYDROCODONE-ACETAMINOPHEN 5-325 MG PO TABS
1.0000 | ORAL_TABLET | ORAL | Status: DC | PRN
  Administered 2020-10-14: 2 via ORAL
  Administered 2020-10-16 – 2020-10-18 (×5): 1 via ORAL
  Filled 2020-10-13 (×3): qty 1
  Filled 2020-10-13: qty 2
  Filled 2020-10-13 (×3): qty 1

## 2020-10-13 MED ORDER — DEXAMETHASONE SODIUM PHOSPHATE 10 MG/ML IJ SOLN
INTRAMUSCULAR | Status: DC | PRN
Start: 2020-10-13 — End: 2020-10-13
  Administered 2020-10-13: 10 mg via INTRAVENOUS

## 2020-10-13 MED ORDER — METOCLOPRAMIDE HCL 5 MG PO TABS: 5.0000 mg | ORAL_TABLET | Freq: Three times a day (TID) | ORAL | Status: DC | PRN

## 2020-10-13 MED ORDER — SPIRONOLACTONE 12.5 MG HALF TABLET: 12.5000 mg | ORAL_TABLET | Freq: Every day | ORAL | Status: DC

## 2020-10-13 MED ORDER — OXYCODONE HCL 5 MG PO TABS: 5.0000 mg | ORAL_TABLET | Freq: Once | ORAL | Status: DC | PRN

## 2020-10-13 MED ORDER — ACETAMINOPHEN 325 MG PO TABS: 325.0000 mg | ORAL_TABLET | Freq: Four times a day (QID) | ORAL | Status: DC | PRN

## 2020-10-13 MED ORDER — FENTANYL CITRATE (PF) 250 MCG/5ML IJ SOLN
INTRAMUSCULAR | Status: AC
  Filled 2020-10-13: qty 5

## 2020-10-13 MED ORDER — 0.9 % SODIUM CHLORIDE (POUR BTL) OPTIME
TOPICAL | Status: DC | PRN
  Administered 2020-10-13: 1000 mL

## 2020-10-13 MED ORDER — CHLORHEXIDINE GLUCONATE 0.12 % MT SOLN: 15.0000 mL | Freq: Once | OROMUCOSAL | Status: AC

## 2020-10-13 MED ORDER — ONDANSETRON HCL 4 MG/2ML IJ SOLN
INTRAMUSCULAR | Status: DC | PRN
  Administered 2020-10-13: 4 mg via INTRAVENOUS

## 2020-10-13 MED ORDER — ROCURONIUM BROMIDE 10 MG/ML (PF) SYRINGE
PREFILLED_SYRINGE | INTRAVENOUS | Status: DC | PRN
  Administered 2020-10-13: 70 mg via INTRAVENOUS

## 2020-10-13 MED ORDER — PROPOFOL 10 MG/ML IV BOLUS
INTRAVENOUS | Status: DC | PRN
  Administered 2020-10-13: 100 mg via INTRAVENOUS

## 2020-10-13 MED ORDER — ONDANSETRON HCL 4 MG/2ML IJ SOLN: 4.0000 mg | Freq: Once | INTRAMUSCULAR | Status: DC | PRN

## 2020-10-13 MED ORDER — SUGAMMADEX SODIUM 200 MG/2ML IV SOLN
INTRAVENOUS | Status: DC | PRN
Start: 2020-10-13 — End: 2020-10-13
  Administered 2020-10-13: 200 mg via INTRAVENOUS

## 2020-10-13 MED FILL — Fentanyl Citrate Preservative Free (PF) Inj 100 MCG/2ML: INTRAMUSCULAR | Qty: 2 | Status: AC

## 2020-10-13 SURGICAL SUPPLY — 74 items
ADH SKN CLS LQ APL DERMABOND (GAUZE/BANDAGES/DRESSINGS) ×1
APL PRP STRL LF DISP 70% ISPRP (MISCELLANEOUS) ×2
BAG COUNTER SPONGE SURGICOUNT (BAG) ×2 IMPLANT
BAG SPNG CNTER NS LX DISP (BAG) ×1
BIT DRILL 4.3 (BIT) ×2
BIT DRILL 4.3X300MM (BIT) IMPLANT
BIT DRILL LONG 3.3 (BIT) ×1 IMPLANT
BIT DRILL QC 3.3X195 (BIT) ×1 IMPLANT
BLADE CLIPPER SURG (BLADE) IMPLANT
BNDG CMPR MED 10X6 ELC LF (GAUZE/BANDAGES/DRESSINGS) ×1
BNDG COHESIVE 6X5 TAN STRL LF (GAUZE/BANDAGES/DRESSINGS) ×2 IMPLANT
BNDG ELASTIC 6X10 VLCR STRL LF (GAUZE/BANDAGES/DRESSINGS) ×2 IMPLANT
BRUSH SCRUB EZ PLAIN DRY (MISCELLANEOUS) ×3 IMPLANT
CANISTER SUCT 3000ML PPV (MISCELLANEOUS) ×2 IMPLANT
CAP LOCK NCB (Cap) ×7 IMPLANT
CHLORAPREP W/TINT 26 (MISCELLANEOUS) ×3 IMPLANT
COVER SURGICAL LIGHT HANDLE (MISCELLANEOUS) ×2 IMPLANT
DERMABOND ADHESIVE PROPEN (GAUZE/BANDAGES/DRESSINGS) ×1
DERMABOND ADVANCED .7 DNX6 (GAUZE/BANDAGES/DRESSINGS) IMPLANT
DRAPE C-ARM 42X72 X-RAY (DRAPES) ×2 IMPLANT
DRAPE C-ARMOR (DRAPES) ×2 IMPLANT
DRAPE HALF SHEET 40X57 (DRAPES) ×4 IMPLANT
DRAPE ORTHO SPLIT 77X108 STRL (DRAPES) ×4
DRAPE SURG 17X23 STRL (DRAPES) ×2 IMPLANT
DRAPE SURG ORHT 6 SPLT 77X108 (DRAPES) ×2 IMPLANT
DRAPE U-SHAPE 47X51 STRL (DRAPES) ×2 IMPLANT
DRSG ADAPTIC 3X8 NADH LF (GAUZE/BANDAGES/DRESSINGS) IMPLANT
DRSG MEPILEX BORDER 4X12 (GAUZE/BANDAGES/DRESSINGS) IMPLANT
DRSG MEPILEX BORDER 4X4 (GAUZE/BANDAGES/DRESSINGS) IMPLANT
DRSG MEPILEX BORDER 4X8 (GAUZE/BANDAGES/DRESSINGS) ×2 IMPLANT
DRSG PAD ABDOMINAL 8X10 ST (GAUZE/BANDAGES/DRESSINGS) ×6 IMPLANT
ELECT REM PT RETURN 9FT ADLT (ELECTROSURGICAL) ×2
ELECTRODE REM PT RTRN 9FT ADLT (ELECTROSURGICAL) ×1 IMPLANT
GAUZE SPONGE 4X4 12PLY STRL (GAUZE/BANDAGES/DRESSINGS) ×2 IMPLANT
GLOVE SURG ENC MOIS LTX SZ6.5 (GLOVE) ×6 IMPLANT
GLOVE SURG ENC MOIS LTX SZ7.5 (GLOVE) ×8 IMPLANT
GLOVE SURG UNDER POLY LF SZ6.5 (GLOVE) ×3 IMPLANT
GLOVE SURG UNDER POLY LF SZ7.5 (GLOVE) ×3 IMPLANT
GOWN STRL REUS W/ TWL LRG LVL3 (GOWN DISPOSABLE) ×3 IMPLANT
GOWN STRL REUS W/TWL LRG LVL3 (GOWN DISPOSABLE) ×6
K-WIRE 2.0 (WIRE) ×2
K-WIRE FXSTD 280X2XNS SS (WIRE) ×1
KIT BASIN OR (CUSTOM PROCEDURE TRAY) ×2 IMPLANT
KIT TURNOVER KIT B (KITS) ×2 IMPLANT
KWIRE FXSTD 280X2XNS SS (WIRE) IMPLANT
NS IRRIG 1000ML POUR BTL (IV SOLUTION) ×2 IMPLANT
PACK TOTAL JOINT (CUSTOM PROCEDURE TRAY) ×2 IMPLANT
PAD ARMBOARD 7.5X6 YLW CONV (MISCELLANEOUS) ×4 IMPLANT
PAD CAST 4YDX4 CTTN HI CHSV (CAST SUPPLIES) ×1 IMPLANT
PADDING CAST COTTON 4X4 STRL (CAST SUPPLIES) ×2
PADDING CAST COTTON 6X4 STRL (CAST SUPPLIES) ×2 IMPLANT
PLATE DIST FEM 12H (Plate) ×1 IMPLANT
SCREW 5.0 70MM (Screw) ×2 IMPLANT
SCREW 5.0 80MM (Screw) ×2 IMPLANT
SCREW NCB 3.5X75X5X6.2XST (Screw) IMPLANT
SCREW NCB 4.0X36MM (Screw) ×1 IMPLANT
SCREW NCB 5.0X38 (Screw) ×2 IMPLANT
SCREW NCB 5.0X75MM (Screw) ×2 IMPLANT
SPONGE T-LAP 18X18 ~~LOC~~+RFID (SPONGE) ×2 IMPLANT
STAPLER VISISTAT 35W (STAPLE) ×2 IMPLANT
SUCTION FRAZIER HANDLE 10FR (MISCELLANEOUS) ×2
SUCTION TUBE FRAZIER 10FR DISP (MISCELLANEOUS) ×1 IMPLANT
SUT ETHILON 3 0 PS 1 (SUTURE) ×3 IMPLANT
SUT MNCRL 3 0 VIOLET RB1 (SUTURE) IMPLANT
SUT MONOCRYL 3 0 RB1 (SUTURE) ×4
SUT VIC AB 0 CT1 27 (SUTURE)
SUT VIC AB 0 CT1 27XBRD ANBCTR (SUTURE) IMPLANT
SUT VIC AB 1 CT1 27 (SUTURE) ×2
SUT VIC AB 1 CT1 27XBRD ANBCTR (SUTURE) IMPLANT
SUT VIC AB 2-0 CT1 27 (SUTURE) ×2
SUT VIC AB 2-0 CT1 TAPERPNT 27 (SUTURE) ×2 IMPLANT
TOWEL GREEN STERILE (TOWEL DISPOSABLE) ×4 IMPLANT
TRAY FOLEY MTR SLVR 16FR STAT (SET/KITS/TRAYS/PACK) IMPLANT
WATER STERILE IRR 1000ML POUR (IV SOLUTION) ×4 IMPLANT

## 2020-10-13 NOTE — Transfer of Care (Signed)
Immediate Anesthesia Transfer of Care Note  Patient: Heather [name]  Procedure(s) Performed: OPEN REDUCTION INTERNAL FIXATION (ORIF) DISTAL FEMUR FRACTURE (Left)  Patient Location: PACU  Anesthesia Type:General  Level of Consciousness: awake, alert  and oriented  Airway & Oxygen Therapy: Patient Spontanous Breathing and Patient connected to face mask oxygen  Post-op Assessment: Report given to RN, Post -op Vital signs reviewed and stable and Patient moving all extremities X 4  Post vital signs: Reviewed and stable  Last Vitals:  Vitals Value Taken Time  BP 151/76 10/13/20 1100  Temp    Pulse 95 10/13/20 1104  Resp 16 10/13/20 1104  SpO2 93 % 10/13/20 1104  Vitals shown include unvalidated device data.  Last Pain:  Vitals:   10/13/20 0837  TempSrc: Oral  PainSc:       Patients Stated Pain Goal: 0 (10/12/20 1946)  Complications: No notable events documented.

## 2020-10-13 NOTE — Plan of Care (Signed)
  Problem: Clinical Measurements: Goal: Ability to maintain clinical measurements within normal limits will improve Outcome: Progressing   Problem: Clinical Measurements: Goal: Diagnostic test results will improve Outcome: Progressing   Problem: Clinical Measurements: Goal: Respiratory complications will improve Outcome: Progressing   Problem: Clinical Measurements: Goal: Cardiovascular complication will be avoided Outcome: Progressing   Problem: Activity: Goal: Risk for activity intolerance will decrease Outcome: Progressing   Problem: Coping: Goal: Level of anxiety will decrease Outcome: Progressing   Problem: Elimination: Goal: Will not experience complications related to urinary retention Outcome: Progressing   Problem: Elimination: Goal: Will not experience complications related to bowel motility Outcome: Progressing   Problem: Pain Managment: Goal: General experience of comfort will improve Outcome: Progressing   Problem: Safety: Goal: Ability to remain free from injury will improve Outcome: Progressing   Problem: Skin Integrity: Goal: Risk for impaired skin integrity will decrease Outcome: Progressing

## 2020-10-13 NOTE — Progress Notes (Signed)
Progress Note  Patient Name: Heather [name] Date of Encounter: 10/13/2020  [facility] Cardiologist: None   Subjective   Still with low grade fevers.  Had per drain placed yesterday.  Denies any chest pain or SOB.    Inpatient Medications    Scheduled Meds:  [MAR Hold] buPROPion  300 mg Oral Daily   [MAR Hold] calcium carbonate  1 tablet Oral BID WC   [MAR Hold] Chlorhexidine Gluconate Cloth  6 each Topical Daily   [MAR Hold] feeding supplement  237 mL Oral TID BM   [MAR Hold] gabapentin  300 mg Oral Daily   And   [MAR Hold] gabapentin  600 mg Oral QHS   [MAR Hold] heparin  5,000 Units Subcutaneous Q8H   [MAR Hold] metoprolol succinate  50 mg Oral Daily   [MAR Hold] multivitamin with minerals  1 tablet Oral Daily   [MAR Hold] rosuvastatin  10 mg Oral Daily   [MAR Hold] sacubitril-valsartan  1 tablet Oral BID   [MAR Hold] sodium chloride flush  3 mL Intravenous Q12H   [MAR Hold] sodium chloride flush  5 mL Intracatheter Q8H   [MAR Hold] spironolactone  12.5 mg Oral Daily   Continuous Infusions:  [MAR Hold] sodium chloride     [MAR Hold] ceFEPime (MAXIPIME) IV 2 g (10/12/20 1745)   [MAR Hold] cefOXitin     lactated ringers 10 mL/hr at 10/13/20 0848   [MAR Hold] methocarbamol (ROBAXIN) IV     [MAR Hold] metronidazole 500 mg (10/13/20 0457)   PRN Meds: [[facility] Hold] sodium chloride, [MAR Hold] acetaminophen, [MAR Hold] albuterol, [MAR Hold] alum & mag hydroxide-simeth, [MAR Hold] HYDROcodone-acetaminophen, [MAR Hold] methocarbamol **OR** [MAR Hold] methocarbamol (ROBAXIN) IV, [MAR Hold] sodium chloride flush, [MAR Hold] traZODone   Vital Signs    Vitals:   10/13/20 0001 10/13/20 0400 10/13/20 0448 10/13/20 0837  BP: 123/68  140/76 130/75  Pulse: 93  100 88  Resp: (!) 24 20 20 18   Temp: 99.5 F (37.5 C)  99.6 F (37.6 C) 99.2 F (37.3 C)  TempSrc: Oral  Oral Oral  SpO2: 93%  92% 93%  Weight:      Height:        Intake/Output Summary (Last 24 hours) at  10/13/2020 0852 Last data filed at 10/13/2020 0553 Gross per 24 hour  Intake 640 ml  Output 1910 ml  Net -1270 ml   Last 3 Weights 10/10/2020 05/14/2020 12/29/2019  Weight (lbs) 128 lb 4.9 oz 188 lb 7.9 oz 188 lb 6.4 oz  Weight (kg) 58.2 kg 85.5 kg 85.458 kg      Telemetry    NSR to sinus tachycardia- Personally Reviewed  ECG    No new EKG to review - Personally Reviewed  Physical Exam   GEN: Well nourished, well developed in no acute distress HEENT: Normal NECK: No JVD; No carotid bruits LYMPHATICS: No lymphadenopathy CARDIAC:RRR, no murmurs, rubs, gallops RESPIRATORY:  Clear to auscultation without rales, wheezing or rhonchi  ABDOMEN: Soft, non-tender, non-distended MUSCULOSKELETAL:  No edema; No deformity  SKIN: Warm and dry NEUROLOGIC:  Alert and oriented x 3 PSYCHIATRIC:  Normal affect   Labs    High Sensitivity Troponin:   Recent Labs  Lab 10/09/20 2056 10/09/20 2317 10/10/20 0100 10/10/20 0540 10/11/20 0037  TROPONINIHS 555* 316* 295* 294* 239*      Chemistry Recent Labs  Lab 10/09/20 1840 10/10/20 0000 10/10/20 0540 10/11/20 0037 10/12/20 0110  NA 137   < > 139 138  138  K 5.7*   < > 4.0 3.7 4.1  CL 111   < > 115* 114* 109  CO2 18*   < > 18* 19* 22  GLUCOSE 105*   < > 84 100* 166*  BUN 30*   < > CREATININE 2.21*   < > 1.10* 0.86 0.81  CALCIUM 6.6*   < > 6.6* 6.6* 7.9*  PROT 5.7*  --  4.7*  --  5.5*  ALBUMIN 3.0*  --  2.5* 2.2* 3.0*  AST 30  --  15  --  22  ALT 13  --  12  --  15  ALKPHOS 54  --  44  --  59  BILITOT 1.3*  --  0.6  --  0.8  GFRNONAA 23*   < > 53* >60 >60  ANIONGAP 8   < > < > = values in this interval not displayed.     Hematology Recent Labs  Lab 10/11/20 0037 10/12/20 0110 10/13/20 0248  WBC 12.4* 10.6* 10.9*  RBC 3.60* 3.47* 3.11*  HGB 9.9* 9.3* 8.4*  HCT 30.2* 29.1* 25.8*  MCV 83.9 83.9 83.0  MCH 27.5 26.8 27.0  MCHC 32.8 32.0 32.6  RDW 18.0* 18.0* 17.8*  PLT 119* 115* 127*    BNP Recent  Labs  Lab 10/09/20 1925  BNP 417.2*     DDimer  Recent Labs  Lab 10/10/20 0000  DDIMER 5.13*    Radiology    CARDIAC CATHETERIZATION  Result Date: 10/11/2020   Prox LAD lesion is 30% stenosed.   Prox RCA to Mid RCA lesion is 20% stenosed.   LV end diastolic pressure is normal. Minimal nonobstructive CAD Normal LVEDP Plan: medical management.   IR Perc Cholecystostomy  Result Date: 10/12/2020 INDICATION: 75 year old female with acute calculus cholecystitis, poor surgical candidate. EXAM: Percutaneous cholecystostomy tube placement MEDICATIONS: 2 g cefoxitin, intravenous; The antibiotic was administered within an appropriate time frame prior to the initiation of the procedure. ANESTHESIA/SEDATION: Moderate (conscious) sedation was employed during this procedure. A total of Versed 2.5 mg and Fentanyl 100 mcg was administered intravenously. Moderate Sedation Time: 11 minutes. The patient's level of consciousness and vital signs were monitored continuously by radiology nursing throughout the procedure under my direct supervision. FLUOROSCOPY TIME:  Fluoroscopy Time: 0 minutes 36 seconds (12.6 mGy). COMPLICATIONS: None immediate. PROCEDURE: Informed written consent was obtained from the patient after a thorough discussion of the procedural risks, benefits and alternatives. All questions were addressed. Maximal Sterile Barrier Technique was utilized including caps, mask, sterile gowns, sterile gloves, sterile drape, hand hygiene and skin antiseptic. A timeout was performed prior to the initiation of the procedure. The patient was placed supine on the angiographic table. The patient's right upper quadrant was then prepped and draped in normal sterile fashion with maximum sterile barrier. Ultrasound demonstrates a distended gallbladder with echogenic gallstones in the fundus. Subdermal Local anesthesia was provided at the planned skin entry site. Under ultrasound guidance, deeper local anesthetic was  provided through anterior abdominal muscles. Ultrasound was used to puncture the gallbladder using a 21 gauge Chiba needle via a subhepatic approach with visualization of the lung treated to the gallbladder. A 0.018 inch wire was advanced into the lumen and a transition dilator placed. A gentle hand injection of contrast was performed. Cholecystogram demonstrates irregular filling defects consistent with gallstones. The cystic duct is patent. A 0.035 inch exchange wire was placed in the tract was dilated.  A 10.2 French multipurpose drainage catheter was advanced into the gallbladder lumen. The drain was then secured in place using a 0-silk suture and a Stayfix device. A sterile dressing was applied. The tube was placed to bag drainage. The patient tolerated procedure well without evidence of immediate complication was transferred back to the floor in stable condition. IMPRESSION: Successful placement of a 10.2 French percutaneous, subhepatic/transperitoneal cholecystostomy tube. Marliss Coots, MD [facility] Electronically Signed   By: Marliss Coots M.D.   On: 10/12/2020 15:21   DG Chest Port 1 View  Result Date: 10/12/2020 CLINICAL DATA:  Sepsis. EXAM: PORTABLE CHEST 1 VIEW COMPARISON:  Chest radiograph dated 10/09/2020. FINDINGS: There is cardiomegaly with vascular congestion. No focal consolidation, pleural effusion or pneumothorax. Degenerative changes of the spine. No acute osseous pathology. Partially visualized right shoulder arthroplasty. IMPRESSION: Cardiomegaly with vascular congestion. No focal consolidation. Electronically Signed   By: Elgie Collard M.D.   On: 10/12/2020 01:33   US Abdomen Limited RUQ (LIVER/GB)  Result Date: 10/12/2020 CLINICAL DATA:  Right upper quadrant abdominal pain EXAM: ULTRASOUND ABDOMEN LIMITED RIGHT UPPER QUADRANT COMPARISON:  None. FINDINGS: Gallbladder: Multiple large small bowel shadowing stones are  present. The largest measures up to 2.3 mm. Gallbladder wall is thickened, measuring up to 4.8 mm. No sonographic Eulah Pont sign is reported. Common bile duct: Diameter: 4.0 mm, within normal limits Liver: No focal lesion identified. Within normal limits in parenchymal echogenicity. Portal vein is patent on color Doppler imaging with normal direction of blood flow towards the liver. Other: None. IMPRESSION: 1. Cholelithiasis with gallbladder wall thickening suggesting acute cholecystitis. 2. No sonographic Eulah Pont sign is reported. Electronically Signed   By: Marin Roberts M.D.   On: 10/12/2020 07:45    Cardiac Studies   2D echo 10/10/2020 IMPRESSIONS    1. Mild mid-apical anterolateral and posterolateral hypokinesis. Left  ventricular ejection fraction, by estimation, is 50 to 55%. The left  ventricle has low normal function. The left ventricle demonstrates  regional wall motion abnormalities (see scoring   diagram/findings for description). There is mild concentric left  ventricular hypertrophy. Left ventricular diastolic parameters are  consistent with Grade II diastolic dysfunction (pseudonormalization).  Elevated left ventricular end-diastolic pressure.   2. Right ventricular systolic function is normal. The right ventricular  size is normal. There is mildly elevated pulmonary artery systolic  pressure.   3. Left atrial size was mildly dilated.   4. The mitral valve is normal in structure. No evidence of mitral valve  regurgitation. No evidence of mitral stenosis.   5. The aortic valve is tricuspid. Aortic valve regurgitation is not  visualized. No aortic stenosis is present.   6. The inferior vena cava is normal in size with greater than 50%  respiratory variability, suggesting right atrial pressure of 3 mmHg.   Patient Profile     75 y.o. female with a hx of hypertension, asthma, bilateral knee replacement, crest syndrome and hyperlipidemia who is being seen 10/10/2020 for the  evaluation of elevated troponin at the request of Dr. Jacqulyn Bath.  Assessment & Plan    Chest pain with elevated troponin / NSTEMI -No issue prior to this past weekend.   -developed N/V/diarrhea and then chest pain -EKG with inferior lateral T wave inversion concerning for ischemia.EKG much worse than prior EKGs -Hs-troponin 555>>316>>295>>294.  -no PE on VQ scan -2D echo showed E 50-55% with mild mid-apical anterolateral and posterolateral HK -cath revealed mild non obstructive CAD with 30% pLAD  and 20% pRCA with normal LVEDP -elevated trop c/w stress MI (Takosubo) -continue ASA 81mg  daily  -Lopressor changed to IV yesterday due to NPO>>change to Toprol XL 50mg  daily -continue Crestor to 40mg  daily  2.  LV dysfunction -minimal non obstructive CAD at cath>>c/w Takotsubo DCM -started on Lopressor 25mg  BID (SVT suppression ) and changed to IV when NPO yesterday>>will change to Toprol XL 50mg  daily today -start Entresto 24-26mg  BID and spiro 12.5mg  daily   3.  AKI -Likely due to poor p.o. intake secondary to nausea vomiting diarrhea -Kidney function improved to 0.81 with hydration   4.  Hypertension -Home antihypertensive on hold due to soft blood pressure and AKI -BP controlled at 140/4mmHg -change IV Lopressor to Toprol XL 50mg  daily and adding Entresto 24-26mg  BID and spiro 12.5mg  daily  5.  Prolonged QT -Improved to QT/QTcB 449/562 ms -Avoid QT prolonging agent   6.  Femur Fx related to mechanical fall -distal femur fracture in a patient with a total knee arthroplasty.  -This requires operative fixation per ortho but now on hold due to new fever  7.  Acute cholecystis/Fever/Sepsis -spiked fever to 102 this am -lactic acid elevated -now with acute cholecystitis -s/p per drain  Patient has non obstructive CAD and low normal LVF with mid to apical AK on echo c/w Takotsubo DCM.  SHe is stable from a cardiac standpoint and ok to proceed with surgery on femur.  [facility]  will sign off.   Medication Recommendations:  ASA 81mg  daily, Toprol XL 50mg  daily, Crestor 10mg  daily, Entresto 24-26mg  BID, Spiro 12.5mg  daily Other recommendations (labs, testing, etc):  BMET 1 week Follow up as an outpatient:  Dr. or PA in 2 weeks   I have spent a total of 35 minutes with patient reviewing 2D echo , telemetry, EKGs, labs and examining patient as well as establishing an assessment and plan that was discussed with the patient.  > 50% of time was spent in direct patient care.     For questions or updates, please contact [facility] Please consult [email or web address] for contact info under        Signed, , MD  10/13/2020, 8:52 AM

## 2020-10-13 NOTE — Interval H&P Note (Signed)
History and Physical Interval Note:  10/13/2020 9:05 AM  Heather [name]  has presented today for surgery, with the diagnosis of LEFT DISTAL FEMUR FRACTURE.  The various methods of treatment have been discussed with the patient and family. After consideration of risks, benefits and other options for treatment, the patient has consented to  Procedure(s): OPEN REDUCTION INTERNAL FIXATION (ORIF) DISTAL FEMUR FRACTURE (Left) as a surgical intervention.  The patient's history has been reviewed, patient examined, no change in status, stable for surgery.  I have reviewed the patient's chart and labs.  Questions were answered to the patient's satisfaction.     Caryn Bee P Blythe Hartshorn

## 2020-10-13 NOTE — Anesthesia Postprocedure Evaluation (Signed)
Anesthesia Post Note  Patient: Heather [name]  Procedure(s) Performed: OPEN REDUCTION INTERNAL FIXATION (ORIF) DISTAL FEMUR FRACTURE (Left)     Patient location during evaluation: PACU Anesthesia Type: General Level of consciousness: awake and alert Pain management: pain level controlled Vital Signs Assessment: post-procedure vital signs reviewed and stable Respiratory status: spontaneous breathing, nonlabored ventilation and respiratory function stable Cardiovascular status: blood pressure returned to baseline and stable Postop Assessment: no apparent nausea or vomiting Anesthetic complications: no   No notable events documented.  Last Vitals:  Vitals:   10/13/20 1130 10/13/20 1145  BP: 132/72 (!) 157/91  Pulse: 94 95  Resp: 18 16  Temp:  36.7 C  SpO2: 96% 95%    Last Pain:  Vitals:   10/13/20 1145  TempSrc:   PainSc: 5                  Lucretia Kern

## 2020-10-13 NOTE — Progress Notes (Signed)
BMET per Dr. Mayford Knife in 1 week

## 2020-10-13 NOTE — Progress Notes (Signed)
Ortho Trauma Note  Patient seen and examined.  Patient underwent percutaneous drain placement for her cholecystitis.  She still has low-grade fever but has not spiked a temp to over 100 degrees since yesterday morning.  She overall feels well.  She has been cleared by cardiology standpoint.  We will plan to proceed with open reduction internal fixation.  Risks and benefits were discussed with the patient.  She agrees to proceed with surgery and consent was obtained.  Roby Lofts, MD [facility] [phone] (office) [email or web address]

## 2020-10-13 NOTE — H&P (View-Only) (Signed)
Ortho Trauma Note  Patient seen and examined.  Patient underwent percutaneous drain placement for her cholecystitis.  She still has low-grade fever but has not spiked a temp to over 100 degrees since yesterday morning.  She overall feels well.  She has been cleared by cardiology standpoint.  We will plan to proceed with open reduction internal fixation.  Risks and benefits were discussed with the patient.  She agrees to proceed with surgery and consent was obtained.  Mikah Rottinghaus P. Darvin Dials, MD [facility] [phone] (office) [email or web address]  

## 2020-10-13 NOTE — Anesthesia Procedure Notes (Signed)
Procedure Name: Intubation Date/Time: 10/13/2020 9:40 AM Performed by: Mariea Clonts, CRNA Pre-anesthesia Checklist: Patient identified, Emergency Drugs available, Suction available and Patient being monitored Patient Re-evaluated:Patient Re-evaluated prior to induction Oxygen Delivery Method: Circle System Utilized Preoxygenation: Pre-oxygenation with 100% oxygen Induction Type: IV induction Ventilation: Mask ventilation without difficulty Laryngoscope Size: Mac and 3 Grade View: Grade I Tube type: Oral Tube size: 7.0 mm Number of attempts: 1 Airway Equipment and Method: Stylet and Oral airway Placement Confirmation: ETT inserted through vocal cords under direct vision, positive ETCO2 and breath sounds checked- equal and bilateral Tube secured with: Tape Dental Injury: Teeth and Oropharynx as per pre-operative assessment

## 2020-10-13 NOTE — Progress Notes (Signed)
Day of Surgery  Subjective: CC: Seen in pacu after ortho procedure. S/p IR perc chole drain placement yesterday. Denies any abdominal pain, n/v.   Objective: Vital signs in last 24 hours: Temp:  [98 F (36.7 C)-99.7 F (37.6 C)] 98 F (36.7 C) (08/25 1145) Pulse Rate:  [88-103] 95 (08/25 1145) Resp:  [15-29] 16 (08/25 1145) BP: (121-157)/(68-91) 157/91 (08/25 1145) SpO2:  [92 %-100 %] 95 % (08/25 1145) Last BM Date: 10/12/20  Intake/Output from previous day: 08/24 0701 - 08/25 0700 In: 640 [P.O.:240; IV Piggyback:400] Out: 1910 [Urine:1650; Drains:260] Intake/Output this shift: Total I/O In: 1450 [IV Piggyback:1450] Out: 300 [Urine:50; Drains:200; Blood:50]  PE: Gen:  Alert, NAD, pleasant Pulm: normal rate and effort  Abd: Soft, ND, NT +BS, perc chole drain in place w/ bile in bag  Lab Results:  Recent Labs    10/12/20 0110 10/13/20 0248  WBC 10.6* 10.9*  HGB 9.3* 8.4*  HCT 29.1* 25.8*  PLT 115* 127*   BMET Recent Labs    10/11/20 0037 10/12/20 0110  NA 138 138  K 3.7 4.1  CL 114* 109  CO2 19* 22  GLUCOSE 100* 166*  BUN 10 12  CREATININE 0.86 0.81  CALCIUM 6.6* 7.9*   PT/INR Recent Labs    10/12/20 0110  LABPROT 16.1*  INR 1.3*   CMP     Component Value Date/Time   NA 138 10/12/2020 0110   K 4.1 10/12/2020 0110   CL 109 10/12/2020 0110   CO2 22 10/12/2020 0110   GLUCOSE 166 (H) 10/12/2020 0110   BUN 12 10/12/2020 0110   CREATININE 0.81 10/12/2020 0110   CALCIUM 7.9 (L) 10/12/2020 0110   PROT 5.5 (L) 10/12/2020 0110   ALBUMIN 3.0 (L) 10/12/2020 0110   AST 22 10/12/2020 0110   ALT 15 10/12/2020 0110   ALKPHOS 59 10/12/2020 0110   BILITOT 0.8 10/12/2020 0110   GFRNONAA >60 10/12/2020 0110   Lipase     Component Value Date/Time   LIPASE 28 10/10/2020 0000    Studies/Results: CARDIAC CATHETERIZATION  Result Date: 10/11/2020   Prox LAD lesion is 30% stenosed.   Prox RCA to Mid RCA lesion is 20% stenosed.   LV end diastolic  pressure is normal. Minimal nonobstructive CAD Normal LVEDP Plan: medical management.   IR Perc Cholecystostomy  Result Date: 10/12/2020 INDICATION: 75 year old female with acute calculus cholecystitis, poor surgical candidate. EXAM: Percutaneous cholecystostomy tube placement MEDICATIONS: 2 g cefoxitin, intravenous; The antibiotic was administered within an appropriate time frame prior to the initiation of the procedure. ANESTHESIA/SEDATION: Moderate (conscious) sedation was employed during this procedure. A total of Versed 2.5 mg and Fentanyl 100 mcg was administered intravenously. Moderate Sedation Time: 11 minutes. The patient's level of consciousness and vital signs were monitored continuously by radiology nursing throughout the procedure under my direct supervision. FLUOROSCOPY TIME:  Fluoroscopy Time: 0 minutes 36 seconds (12.6 mGy). COMPLICATIONS: None immediate. PROCEDURE: Informed written consent was obtained from the patient after a thorough discussion of the procedural risks, benefits and alternatives. All questions were addressed. Maximal Sterile Barrier Technique was utilized including caps, mask, sterile gowns, sterile gloves, sterile drape, hand hygiene and skin antiseptic. A timeout was performed prior to the initiation of the procedure. The patient was placed supine on the angiographic table. The patient's right upper quadrant was then prepped and draped in normal sterile fashion with maximum sterile barrier. Ultrasound demonstrates a distended gallbladder with echogenic gallstones in the fundus. Subdermal Local anesthesia was  provided at the planned skin entry site. Under ultrasound guidance, deeper local anesthetic was provided through anterior abdominal muscles. Ultrasound was used to puncture the gallbladder using a 21 gauge Chiba needle via a subhepatic approach with visualization of the lung treated to the gallbladder. A 0.018 inch wire was advanced into the lumen and a transition dilator  placed. A gentle hand injection of contrast was performed. Cholecystogram demonstrates irregular filling defects consistent with gallstones. The cystic duct is patent. A 0.035 inch exchange wire was placed in the tract was dilated. A 10.2 French multipurpose drainage catheter was advanced into the gallbladder lumen. The drain was then secured in place using a 0-silk suture and a Stayfix device. A sterile dressing was applied. The tube was placed to bag drainage. The patient tolerated procedure well without evidence of immediate complication was transferred back to the floor in stable condition. IMPRESSION: Successful placement of a 10.2 French percutaneous, subhepatic/transperitoneal cholecystostomy tube. Marliss Coots, MD [facility] Electronically Signed   By: Marliss Coots M.D.   On: 10/12/2020 15:21   DG Chest Port 1 View  Result Date: 10/12/2020 CLINICAL DATA:  Sepsis. EXAM: PORTABLE CHEST 1 VIEW COMPARISON:  Chest radiograph dated 10/09/2020. FINDINGS: There is cardiomegaly with vascular congestion. No focal consolidation, pleural effusion or pneumothorax. Degenerative changes of the spine. No acute osseous pathology. Partially visualized right shoulder arthroplasty. IMPRESSION: Cardiomegaly with vascular congestion. No focal consolidation. Electronically Signed   By: Elgie Collard M.D.   On: 10/12/2020 01:33   DG C-Arm 1-60 Min-No Report  Result Date: 10/13/2020 Fluoroscopy was utilized by the requesting physician.  No radiographic interpretation.   US Abdomen Limited RUQ (LIVER/GB)  Result Date: 10/12/2020 CLINICAL DATA:  Right upper quadrant abdominal pain EXAM: ULTRASOUND ABDOMEN LIMITED RIGHT UPPER QUADRANT COMPARISON:  None. FINDINGS: Gallbladder: Multiple large small bowel shadowing stones are present. The largest measures up to 2.3 mm. Gallbladder wall is thickened, measuring up to 4.8 mm. No sonographic Eulah Pont sign is reported.  Common bile duct: Diameter: 4.0 mm, within normal limits Liver: No focal lesion identified. Within normal limits in parenchymal echogenicity. Portal vein is patent on color Doppler imaging with normal direction of blood flow towards the liver. Other: None. IMPRESSION: 1. Cholelithiasis with gallbladder wall thickening suggesting acute cholecystitis. 2. No sonographic Eulah Pont sign is reported. Electronically Signed   By: Marin Roberts M.D.   On: 10/12/2020 07:45    Anti-infectives: Anti-infectives (From admission, onward)    Start     Dose/Rate Route Frequency Ordered Stop   10/13/20 1004  vancomycin (VANCOCIN) powder  Status:  Discontinued          As needed 10/13/20 1005 10/13/20 1055   10/13/20 0400  vancomycin (VANCOCIN) IVPB 1000 mg/200 mL premix  Status:  Discontinued        1,000 mg 200 mL/hr over 60 Minutes Intravenous Every 24 hours 10/12/20 0241 10/12/20 0810   10/12/20 1600  [MAR Hold]  ceFEPIme (MAXIPIME) 2 g in sodium chloride 0.9 % 100 mL IVPB        (MAR Hold since Thu 10/13/2020 at 0844.Hold Reason: Transfer to a Procedural area)   2 g 200 mL/hr over 30 Minutes Intravenous Every 12 hours 10/12/20 0241     10/12/20 1330  [MAR Hold]  cefOXitin (MEFOXIN) 2 g in sodium chloride 0.9 % 100 mL IVPB        (MAR Hold since Thu 10/13/2020 at 0844.Hold Reason: Transfer to a Procedural area)  2 g 200 mL/hr over 30 Minutes Intravenous To Radiology 10/12/20 1241 10/13/20 1330   10/12/20 0330  [MAR Hold]  metroNIDAZOLE (FLAGYL) IVPB 500 mg        (MAR Hold since Thu 10/13/2020 at 0844.Hold Reason: Transfer to a Procedural area)   500 mg 100 mL/hr over 60 Minutes Intravenous Every 8 hours 10/12/20 0235     10/12/20 0330  vancomycin (VANCOCIN) IVPB 1000 mg/200 mL premix        1,000 mg 200 mL/hr over 60 Minutes Intravenous  Once 10/12/20 0237 10/12/20 1149   10/12/20 0330  ceFEPIme (MAXIPIME) 2 g in sodium chloride 0.9 % 100 mL IVPB        2 g 200 mL/hr over 30 Minutes Intravenous  Once  10/12/20 0237 10/12/20 1148   10/12/20 0100  vancomycin (VANCOREADY) IVPB 750 mg/150 mL  Status:  Discontinued        750 mg 150 mL/hr over 60 Minutes Intravenous Every 48 hours 10/09/20 2343 10/10/20 0958   10/10/20 0000  vancomycin (VANCOREADY) IVPB 1750 mg/350 mL        1,750 mg 175 mL/hr over 120 Minutes Intravenous  Once 10/09/20 2302 10/10/20 0308   10/09/20 2315  ceFEPIme (MAXIPIME) 2 g in sodium chloride 0.9 % 100 mL IVPB        2 g 200 mL/hr over 30 Minutes Intravenous  Once 10/09/20 2302 10/10/20 0026   10/09/20 2300  metroNIDAZOLE (FLAGYL) IVPB 500 mg  Status:  Discontinued        500 mg 100 mL/hr over 60 Minutes Intravenous Every 12 hours 10/09/20 2251 10/10/20 0958   10/09/20 2200  ceFEPIme (MAXIPIME) 2 g in sodium chloride 0.9 % 100 mL IVPB  Status:  Discontinued        2 g 200 mL/hr over 30 Minutes Intravenous Every 24 hours 10/09/20 2343 10/10/20 0958        Assessment/Plan Acute cholecystitis - s/p Perc Chole Drain placement by IR 8/24 - Cont abx  - Okay for diet - Continue to trend labs - Will need follow up with Dr. Luisa Hart to discuss possible interval lap chole in ~6-8 weeks after she has recovered from medical side of things as well as her femur surgery. Will also need follow up with IR.    FEN - CLD VTE - heparin 5000 units TID ID - Cefipime/flagyl      SVT - on BB, per cards NSTEMI - per primary/cards L femur fx after fall - s/p OIRF 8/25. Per Ortho HTN HLD Thrombocytopenia - plts 115   LOS: 4 days    Jacinto Halim , [facility] Surgery 10/13/2020, 11:59 AM Please see Amion for pager number during day hours 7:00am-4:30pm

## 2020-10-13 NOTE — Progress Notes (Signed)
PROGRESS NOTE    Heather [name]  MVE:[id number] DOB: December 24, 1945 DOA: 10/09/2020 PCP: Janece Canterbury, MD   Chief Complaint  Patient presents with   Fall   Brief Narrative:  Heather [name] is Heather [name] 75 y.o. female with medical history significant for depression, hyperlipidemia, hypertension, asthma, L3 burst fracture s/p repair, who was brought to the hospital after Ares Tegtmeyer fall at home.   She was diagnosed with Heather [name] distal femoral fracture.  She was also found to have AKI, hyperkalemia, elevated troponins concerning for NSTEMI.  She was treated with IV fluids and analgesics.  She underwent left heart cath which showed minimal nonobstructive CAD.    She developed fever and tachycardia overnight 8/23-24.  She was started on broad spectrum abx and treated for sepsis.  RUQ US showed cholecystitis.  Surgery was c/s and she's now s/p perc chole drain.   Assessment & Plan:   Active Problems:   Essential hypertension   Hypotension   Femur fracture (HCC)   Prolonged QT interval   Abnormal ECG   AKI (acute kidney injury) (HCC)   Hyperkalemia   Elevated troponin   Leukocytosis   Malnutrition of moderate degree  Sepsis 2/2 Acute Cholecystitis Fever, tachycardia, tachypnea - meets criteria for sepsis RUQ [place] with acute cholecystitis CXR with cardiomegaly with vascular congestion She's now s/p perc chole drain by IR 8/24 Appreciate surgery recs - defer lap chole at this time, perc chole by IR -> planning for drain placement with interval lap chole in 6-8 weeks.  Continue abx. Appreciate IR recs Repeat blood cultures no growth  UA on admission not concerning for UTI.  1/2 sets of blood cultures from admission with coag negative staph, suspect contaminant.  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  Acute Kidney Injury Creatinine 2.21 at presentation  Improving - follow metabolic panel tomorrow  Hypocalcemia  Hypophosphatemia Follow - no metabolic panel today Follow tomorrow  Prolonged Qtc Repeat EKG - wnl 8/25  Thrombocytopenia Continue to monitor - fluctuating, improved today  DVT prophylaxis: (heparin Code Status: (full  Family Communication: none at bedside Disposition:   Status is: Inpatient  Remains inpatient appropriate because:Inpatient level of care appropriate due to severity of illness  Dispo: The patient is from: Home              Anticipated d/c is to: Home              Patient currently is not medically stable to d/c.   Difficult to place patient No       Consultants:  Cardiology IR General surgery orthopedics  Procedures:  ORIF periprosthetic distal femur fx 8/25 8/24   IMPRESSION: Successful placement of Brittani Purdum 10.2 French percutaneous, subhepatic/transperitoneal cholecystostomy tube.  8/23   Prox LAD lesion is 30% stenosed.   Prox RCA to Mid RCA lesion is 20% stenosed.   LV end diastolic pressure is normal.   Minimal nonobstructive CAD Normal LVEDP   Plan: medical management.  Echo IMPRESSIONS     1. Mild mid-apical anterolateral and posterolateral hypokinesis. Left  ventricular ejection fraction, by estimation, is 50 to 55%. The  left  ventricle has low normal function. The left ventricle demonstrates  regional wall motion abnormalities (see scoring   diagram/findings for description). There is mild concentric left  ventricular hypertrophy. Left ventricular diastolic parameters are  consistent with Grade II diastolic dysfunction (pseudonormalization).  Elevated left ventricular  end-diastolic pressure.   2. Right ventricular systolic function is normal. The right ventricular  size is normal. There is mildly elevated pulmonary artery systolic  pressure.   3. Left atrial size was mildly dilated.   4. The mitral valve is normal in structure. No evidence of mitral valve  regurgitation. No evidence of mitral stenosis.   5. The aortic valve is tricuspid. Aortic valve regurgitation is not  visualized. No aortic stenosis is present.   6. The inferior vena cava is normal in size with greater than 50%  respiratory variability, suggesting right atrial pressure of 3 mmHg.   Antimicrobials:  Anti-infectives (From admission, onward)    Start     Dose/Rate Route Frequency Ordered Stop   10/13/20 1004  vancomycin (VANCOCIN) powder  Status:  Discontinued          As needed 10/13/20 1005 10/13/20 1055   10/13/20 0400  vancomycin (VANCOCIN) IVPB 1000 mg/200 mL premix  Status:  Discontinued        1,000 mg 200 mL/hr over 60 Minutes Intravenous Every 24 hours 10/12/20 0241 10/12/20 0810   10/12/20 1600  ceFEPIme (MAXIPIME) 2 g in sodium chloride 0.9 % 100 mL IVPB        2 g 200 mL/hr over 30 Minutes Intravenous Every 12 hours 10/12/20 0241     10/12/20 1330  cefOXitin (MEFOXIN) 2 g in sodium chloride 0.9 % 100 mL IVPB        2 g 200 mL/hr over 30 Minutes Intravenous To Radiology 10/12/20 1241 10/13/20 1330   10/12/20 0330  metroNIDAZOLE (FLAGYL) IVPB 500 mg        500 mg 100 mL/hr over 60 Minutes Intravenous Every 8 hours 10/12/20 0235     10/12/20 0330  vancomycin (VANCOCIN) IVPB 1000 mg/200 mL premix        1,000 mg 200 mL/hr over 60 Minutes Intravenous  Once 10/12/20 0237 10/12/20 1149   10/12/20 0330  ceFEPIme (MAXIPIME) 2 g in sodium chloride 0.9 % 100 mL IVPB        2 g 200 mL/hr over 30 Minutes Intravenous  Once 10/12/20 0237 10/12/20 1148   10/12/20 0100  vancomycin (VANCOREADY) IVPB 750 mg/150 mL  Status:  Discontinued        750 mg 150 mL/hr over 60 Minutes  Intravenous Every 48 hours 10/09/20 2343 10/10/20 0958   10/10/20 0000  vancomycin (VANCOREADY) IVPB 1750 mg/350 mL        1,750 mg 175 mL/hr over 120 Minutes Intravenous  Once 10/09/20 2302 10/10/20 0308   10/09/20 2315  ceFEPIme (MAXIPIME) 2 g in sodium chloride 0.9 % 100 mL IVPB        2 g 200 mL/hr over 30 Minutes Intravenous  Once 10/09/20 2302 10/10/20 0026   10/09/20 2300  metroNIDAZOLE (FLAGYL) IVPB 500 mg  Status:  Discontinued        500 mg 100 mL/hr over 60 Minutes Intravenous Every 12 hours 10/09/20 2251 10/10/20 0958   10/09/20 2200  ceFEPIme (MAXIPIME) 2 g in sodium chloride 0.9 % 100 mL IVPB  Status:  Discontinued        2 g 200 mL/hr over 30 Minutes Intravenous Every 24  hours 10/09/20 2343 10/10/20 0958          Subjective: Feels ok after surgery  Objective: Vitals:   10/13/20 1130 10/13/20 1145 10/13/20 1241 10/13/20 1500  BP: 132/72 (!) 157/91 (!) 145/80   Pulse: 94 95 94 75  Resp: Temp:  98 F (36.7 C)    TempSrc:      SpO2: 96% 95%  95%  Weight:      Height:        Intake/Output Summary (Last 24 hours) at 10/13/2020 1633 Last data filed at 10/13/2020 1600 Gross per 24 hour  Intake 3293 ml  Output 1310 ml  Net 1983 ml   Filed Weights   10/10/20 2000  Weight: 58.2 kg    Examination:  General: No acute distress. Cardiovascular: RRR Lungs: unlabored Abdomen: Soft, nontender, nondistended Neurological: Alert and oriented 3. Moves all extremities 4 . Cranial nerves II through XII grossly intact. Skin: Warm and dry. No rashes or lesions. Extremities: LLE in immobilizer     Data Reviewed: I have personally reviewed following labs and imaging studies  CBC: Recent Labs  Lab 10/09/20 1840 10/10/20 0639 10/11/20 0037 10/12/20 0110 10/13/20 0248  WBC 18.6* 13.1* 12.4* 10.6* 10.9*  NEUTROABS 13.7* 10.3*  --  8.4*  --   HGB 13.1 9.4* 9.9* 9.3* 8.4*  HCT 42.4 30.2* 30.2* 29.1* 25.8*  MCV 87.2 86.8 83.9 83.9 83.0  PLT 149* 116*  119* 115* 127*    Basic Metabolic Panel: Recent Labs  Lab 10/09/20 1840 10/10/20 0000 10/10/20 0540 10/11/20 0037 10/12/20 0110  NA 137 141 139 138 138  K 5.7* 4.1 4.0 3.7 4.1  CL 111 116* 115* 114* 109  CO2 18* 19* 18* 19* 22  GLUCOSE 105* 73 84 100* 166*  BUN 30* 24* CREATININE 2.21* 1.40* 1.10* 0.86 0.81  CALCIUM 6.6* 6.1* 6.6* 6.6* 7.9*  MG  --  1.7  --   --  1.9  PHOS  --  2.5  --  1.4* 2.1*    GFR: Estimated Creatinine Clearance: 56.8 mL/min (by C-G formula based on SCr of 0.81 mg/dL).  Liver Function Tests: Recent Labs  Lab 10/09/20 1840 10/10/20 0540 10/11/20 0037 10/12/20 0110  AST 30 15  --  22  ALT 13 12  --  15  ALKPHOS 54 44  --  59  BILITOT 1.3* 0.6  --  0.8  PROT 5.7* 4.7*  --  5.5*  ALBUMIN 3.0* 2.5* 2.2* 3.0*    CBG: No results for input(s): GLUCAP in the last 168 hours.   Recent Results (from the past 240 hour(s))  Resp Panel by RT-PCR (Flu Astryd Pearcy&B, Covid) Nasopharyngeal Swab     Status: None   Collection Time: 10/09/20  6:41 PM   Specimen: Nasopharyngeal Swab; Nasopharyngeal(NP) swabs in vial transport medium  Result Value Ref Range Status   SARS Coronavirus 2 by RT PCR NEGATIVE NEGATIVE Final    Comment: (NOTE) SARS-CoV-2 target nucleic acids are NOT DETECTED.  The SARS-CoV-2 RNA is generally detectable in upper respiratory specimens during the acute phase of infection. The lowest concentration of SARS-CoV-2 viral copies this assay can detect is 138 copies/mL. Kapono Luhn negative result does not preclude SARS-Cov-2 infection and should not be used as the sole basis for treatment or other patient management decisions. Cleofas Hudgins negative result may occur with  improper specimen collection/handling, submission of specimen other than nasopharyngeal swab, presence of viral mutation(s) within the areas  targeted by this assay, and inadequate number of viral copies(<138 copies/mL). Tangy Drozdowski negative result must be combined with clinical observations, patient  history, and epidemiological information. The expected result is Negative.  Fact Sheet for Patients:  BloggerCourse.com  Fact Sheet for Healthcare Providers:  SeriousBroker.it  This test is no t yet approved or cleared by the [place] FDA and  has been authorized for detection and/or diagnosis of SARS-CoV-2 by FDA under an Emergency Use Authorization (EUA). This EUA will remain  in effect (meaning this test can be used) for the duration of the COVID-19 declaration under Section 564(b)(1) of the Act, 21 U.S.C.section 360bbb-3(b)(1), unless the authorization is terminated  or revoked sooner.       Influenza Morgin Halls by PCR NEGATIVE NEGATIVE Final   Influenza B by PCR NEGATIVE NEGATIVE Final    Comment: (NOTE) The Xpert Xpress SARS-CoV-2/FLU/RSV plus assay is intended as an aid in the diagnosis of influenza from Nasopharyngeal swab specimens and should not be used as Priseis Cratty sole basis for treatment. Nasal washings and aspirates are unacceptable for Xpert Xpress SARS-CoV-2/FLU/RSV testing.  Fact Sheet for Patients: BloggerCourse.com  Fact Sheet for Healthcare Providers: SeriousBroker.it  This test is not yet approved or cleared by the [place] FDA and has been authorized for detection and/or diagnosis of SARS-CoV-2 by FDA under an Emergency Use Authorization (EUA). This EUA will remain in effect (meaning this test can be used) for the duration of the COVID-19 declaration under Section 564(b)(1) of the Act, 21 U.S.C. section 360bbb-3(b)(1), unless the authorization is terminated or revoked.  Performed at [facility], [address] [place], [place] [address]   Culture, blood (routine x 2)     Status: None (Preliminary result)   Collection Time: 10/09/20  7:55 PM   Specimen: BLOOD  Result Value Ref Range Status   Specimen Description   Final    BLOOD RIGHT  ANTECUBITAL Performed at [facility], [address], [place], [place] [address]    Special Requests   Final    BOTTLES DRAWN AEROBIC AND ANAEROBIC Blood Culture results may not be optimal due to an inadequate volume of blood received in culture bottles Performed at [facility], [address], [place], [place] [address]    Culture   Final    NO GROWTH 3 DAYS Performed at [facility], [address] [place], [place] [address]    Report Status PENDING  Incomplete  Culture, blood (routine x 2)     Status: Abnormal   Collection Time: 10/09/20  7:55 PM   Specimen: BLOOD  Result Value Ref Range Status   Specimen Description   Final    BLOOD LEFT ANTECUBITAL Performed at [facility], [address] [place], [place] [address]    Special Requests   Final    BOTTLES DRAWN AEROBIC AND ANAEROBIC Blood Culture results may not be optimal due to an inadequate volume of blood received in culture bottles Performed at [facility], [address], [place], [place] [address]    Culture  Setup Time   Final    GRAM POSITIVE COCCI IN CLUSTERS IN BOTH AEROBIC AND ANAEROBIC BOTTLES CRITICAL RESULT CALLED TO, READ BACK BY AND VERIFIED WITH: L SEAY,PHARMD@2330  10/10/20 MK    Culture (Megen Madewell)  Final    STAPHYLOCOCCUS EPIDERMIDIS STAPHYLOCOCCUS HOMINIS THE SIGNIFICANCE OF ISOLATING THIS ORGANISM FROM Lyndon Chenoweth SINGLE SET OF BLOOD CULTURES WHEN MULTIPLE SETS ARE DRAWN IS UNCERTAIN. PLEASE  NOTIFY THE MICROBIOLOGY DEPARTMENT WITHIN ONE WEEK IF SPECIATION AND SENSITIVITIES ARE REQUIRED. Performed at [facility], [address], [place], [place] [address]    Report Status 10/13/2020 FINAL  Final  Blood Culture ID Panel (Reflexed)     Status: Abnormal   Collection Time: 10/09/20  7:55 PM  Result Value Ref Range Status   Enterococcus faecalis NOT DETECTED NOT DETECTED Final   Enterococcus Faecium NOT DETECTED NOT DETECTED Final    Listeria monocytogenes NOT DETECTED NOT DETECTED Final   Staphylococcus species DETECTED (Ernie Sagrero) NOT DETECTED Final    Comment: CRITICAL RESULT CALLED TO, READ BACK BY AND VERIFIED WITH: L SEAY,PHARMD@2330  10/10/20 MK    Staphylococcus aureus (BCID) NOT DETECTED NOT DETECTED Final   Staphylococcus epidermidis DETECTED (Zakiah Gauthreaux) NOT DETECTED Final    Comment: Methicillin (oxacillin) resistant coagulase negative staphylococcus. Possible blood culture contaminant (unless isolated from more than one blood culture draw or clinical case suggests pathogenicity). No antibiotic treatment is indicated for blood  culture contaminants. CRITICAL RESULT CALLED TO, READ BACK BY AND VERIFIED WITH: L SEAY,PHARMD@2330  10/10/20 MK    Staphylococcus lugdunensis NOT DETECTED NOT DETECTED Final   Streptococcus species NOT DETECTED NOT DETECTED Final   Streptococcus agalactiae NOT DETECTED NOT DETECTED Final   Streptococcus pneumoniae NOT DETECTED NOT DETECTED Final   Streptococcus pyogenes NOT DETECTED NOT DETECTED Final   Elexia Friedt.calcoaceticus-baumannii NOT DETECTED NOT DETECTED Final   Bacteroides fragilis NOT DETECTED NOT DETECTED Final   Enterobacterales NOT DETECTED NOT DETECTED Final   Enterobacter cloacae complex NOT DETECTED NOT DETECTED Final   Escherichia coli NOT DETECTED NOT DETECTED Final   Klebsiella aerogenes NOT DETECTED NOT DETECTED Final   Klebsiella oxytoca NOT DETECTED NOT DETECTED Final   Klebsiella pneumoniae NOT DETECTED NOT DETECTED Final   Proteus species NOT DETECTED NOT DETECTED Final   Salmonella species NOT DETECTED NOT DETECTED Final   Serratia marcescens NOT DETECTED NOT DETECTED Final   Haemophilus influenzae NOT DETECTED NOT DETECTED Final   Neisseria meningitidis NOT DETECTED NOT DETECTED Final   Pseudomonas aeruginosa NOT DETECTED NOT DETECTED Final   Stenotrophomonas maltophilia NOT DETECTED NOT DETECTED Final   Candida albicans NOT DETECTED NOT DETECTED Final   Candida auris NOT  DETECTED NOT DETECTED Final   Candida glabrata NOT DETECTED NOT DETECTED Final   Candida krusei NOT DETECTED NOT DETECTED Final   Candida parapsilosis NOT DETECTED NOT DETECTED Final   Candida tropicalis NOT DETECTED NOT DETECTED Final   Cryptococcus neoformans/gattii NOT DETECTED NOT DETECTED Final   Methicillin resistance mecA/C DETECTED (Sarahmarie Leavey) NOT DETECTED Final    Comment: CRITICAL RESULT CALLED TO, READ BACK BY AND VERIFIED WITH: L SEAY,PHARMD  10/10/20 MK Performed at [facility], [address], [place], [place] [address]   MRSA Next Gen by PCR, Nasal     Status: None   Collection Time: 10/10/20  4:17 PM   Specimen: Nasal Mucosa; Nasal Swab  Result Value Ref Range Status   MRSA by PCR Next Gen NOT DETECTED NOT DETECTED Final    Comment: (NOTE) The GeneXpert MRSA Assay (FDA approved for NASAL specimens only), is one component of Rocsi Hazelbaker comprehensive MRSA colonization surveillance program. It is not intended to diagnose MRSA infection nor to guide or monitor treatment for MRSA infections. Test performance is not FDA approved in patients less than 61 years old. Performed at [facility], [address] [place], [place] [address]   Culture, blood (x 2)     Status:  None (Preliminary result)   Collection Time: 10/12/20  1:10 AM   Specimen: BLOOD  Result Value Ref Range Status   Specimen Description BLOOD RIGHT ANTECUBITAL  Final   Special Requests   Final    BOTTLES DRAWN AEROBIC AND ANAEROBIC Blood Culture adequate volume   Culture   Final    NO GROWTH 1 DAY Performed at [facility], [address], [place], [place] [address]    Report Status PENDING  Incomplete  Culture, blood (x 2)     Status: None (Preliminary result)   Collection Time: 10/12/20  1:10 AM   Specimen: BLOOD RIGHT HAND  Result Value Ref Range Status   Specimen Description BLOOD RIGHT HAND  Final   Special Requests   Final    BOTTLES DRAWN AEROBIC AND ANAEROBIC Blood Culture adequate  volume   Culture   Final    NO GROWTH 1 DAY Performed at [facility], [address], [place], [place] [address]    Report Status PENDING  Incomplete         Radiology Studies: IR Perc Cholecystostomy  Result Date: 10/12/2020 INDICATION: 75 year old female with acute calculus cholecystitis, poor surgical candidate. EXAM: Percutaneous cholecystostomy tube placement MEDICATIONS: 2 g cefoxitin, intravenous; The antibiotic was administered within an appropriate time frame prior to the initiation of the procedure. ANESTHESIA/SEDATION: Moderate (conscious) sedation was employed during this procedure. Yanique Mulvihill total of Versed 2.5 mg and Fentanyl 100 mcg was administered intravenously. Moderate Sedation Time: 11 minutes. The patient's level of consciousness and vital signs were monitored continuously by radiology nursing throughout the procedure under my direct supervision. FLUOROSCOPY TIME:  Fluoroscopy Time: 0 minutes 36 seconds (12.6 mGy). COMPLICATIONS: None immediate. PROCEDURE: Informed written consent was obtained from the patient after Shanyce Daris thorough discussion of the procedural risks, benefits and alternatives. All questions were addressed. Maximal Sterile Barrier Technique was utilized including caps, mask, sterile gowns, sterile gloves, sterile drape, hand hygiene and skin antiseptic. Barclay Lennox timeout was performed prior to the initiation of the procedure. The patient was placed supine on the angiographic table. The patient's right upper quadrant was then prepped and draped in normal sterile fashion with maximum sterile barrier. Ultrasound demonstrates Latoyna Hird distended gallbladder with echogenic gallstones in the fundus. Subdermal Local anesthesia was provided at the planned skin entry site. Under ultrasound guidance, deeper local anesthetic was provided through anterior abdominal muscles. Ultrasound was used to puncture the gallbladder using Luvinia Lucy 21 gauge Chiba needle via Hershal Eriksson subhepatic approach with visualization of  the lung treated to the gallbladder. Mercedez Boule 0.018 inch wire was advanced into the lumen and Victorino Fatzinger transition dilator placed. Dorean Daniello gentle hand injection of contrast was performed. Cholecystogram demonstrates irregular filling defects consistent with gallstones. The cystic duct is patent. Jamecia Lerman 0.035 inch exchange wire was placed in the tract was dilated. Arnice Vanepps 10.2 French multipurpose drainage catheter was advanced into the gallbladder lumen. The drain was then secured in place using Jameika Kinn 0-silk suture and Minor Iden Stayfix device. Tarvaris Puglia sterile dressing was applied. The tube was placed to bag drainage. The patient tolerated procedure well without evidence of immediate complication was transferred back to the floor in stable condition. IMPRESSION: Successful placement of Genie Wenke 10.2 French percutaneous, subhepatic/transperitoneal cholecystostomy tube. Marliss Coots, MD [facility] Electronically Signed   By: Marliss Coots M.D.   On: 10/12/2020 15:21   DG Chest Port 1 View  Result Date: 10/12/2020 CLINICAL DATA:  Sepsis. EXAM: PORTABLE CHEST 1 VIEW COMPARISON:  Chest  radiograph dated 10/09/2020. FINDINGS: There is cardiomegaly with vascular congestion. No focal consolidation, pleural effusion or pneumothorax. Degenerative changes of the spine. No acute osseous pathology. Partially visualized right shoulder arthroplasty. IMPRESSION: Cardiomegaly with vascular congestion. No focal consolidation. Electronically Signed   By: Elgie Collard M.D.   On: 10/12/2020 01:33   DG C-Arm 1-60 Min-No Report  Result Date: 10/13/2020 Fluoroscopy was utilized by the requesting physician.  No radiographic interpretation.   DG FEMUR MIN 2 VIEWS LEFT  Result Date: 10/13/2020 CLINICAL DATA:  Elective surgery. EXAM: LEFT FEMUR 2 VIEWS COMPARISON:  Preoperative imaging. FINDINGS: Six fluoroscopic spot views of the left femur obtained in the operating room. Lateral plate and multi screw fixation of distal femur  fracture. 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 Total fluoroscopy time 1 minutes 5 seconds. Dose 5.98 mGy. IMPRESSION: Intraoperative fluoroscopy for distal femur fracture ORIF. Electronically Signed   By: Narda Rutherford M.D.   On: 10/13/2020 12:39   DG FEMUR PORT MIN 2 VIEWS LEFT  Result Date: 10/13/2020 CLINICAL DATA:  Postop left femur fracture repair. EXAM: LEFT FEMUR PORTABLE 2 VIEWS COMPARISON:  Preoperative imaging. FINDINGS: Lateral plate and multi screw fixation of comminuted distal femur fracture. Fracture is in improved alignment from preoperative imaging. 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 Recent postsurgical change includes air and edema in the soft tissues. IMPRESSION: ORIF of comminuted distal femur fracture, in improved alignment from preoperative imaging. Electronically Signed   By: Narda Rutherford M.D.   On: 10/13/2020 12:40   US Abdomen Limited RUQ (LIVER/GB)  Result Date: 10/12/2020 CLINICAL DATA:  Right upper quadrant abdominal pain EXAM: ULTRASOUND ABDOMEN LIMITED RIGHT UPPER QUADRANT COMPARISON:  None. FINDINGS: Gallbladder: Multiple large small bowel shadowing stones are present. The largest measures up to 2.3 mm. Gallbladder wall is thickened, measuring up to 4.8 mm. No sonographic Eulah Pont sign is reported. Common bile duct: Diameter: 4.0 mm, within normal limits Liver: No focal lesion identified. Within normal limits in parenchymal echogenicity. Portal vein is patent on color Doppler imaging with normal direction of blood flow towards the liver. Other: None. IMPRESSION: 1. Cholelithiasis with gallbladder wall thickening suggesting acute cholecystitis. 2. No sonographic Eulah Pont sign is reported. Electronically Signed   By: Marin Roberts M.D.   On: 10/12/2020 07:45        Scheduled Meds:  buPROPion  300 mg Oral Daily   calcium carbonate  1 tablet Oral BID WC   Chlorhexidine Gluconate Cloth  6 each Topical Daily   docusate sodium  100 mg Oral BID   [START ON  10/14/2020] enoxaparin (LOVENOX) injection  40 mg Subcutaneous Q24H   feeding supplement  237 mL Oral TID BM   fentaNYL       gabapentin  300 mg Oral Daily   And   gabapentin  600 mg Oral QHS   metoprolol succinate  50 mg Oral Daily   multivitamin with minerals  1 tablet Oral Daily   rosuvastatin  10 mg Oral Daily   sacubitril-valsartan  1 tablet Oral BID   sodium chloride flush  3 mL Intravenous Q12H   sodium chloride flush  5 mL Intracatheter Q8H   spironolactone  12.5 mg Oral Daily   Continuous Infusions:  sodium chloride     ceFEPime (MAXIPIME) IV 2 g (10/12/20 1745)   lactated ringers 900 mL/hr at 10/13/20 1055   methocarbamol (ROBAXIN) IV     metronidazole 500 mg (10/13/20 1411)     LOS: 4 days    Time  spent: over 30 min    Lacretia Nicks, MD [facility]   To contact the attending provider between 7A-7P or the covering provider during after hours 7P-7A, please log into the web site [email or web address] and access using universal [place] password for that web site. If you do not have the password, please call the hospital operator.  10/13/2020, 4:33 PM
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
## 2020-10-13 NOTE — Op Note (Signed)
Orthopaedic Surgery Operative Note (CSN: [id number] ) Date of Surgery: 10/13/2020  Admit Date: 10/09/2020   Diagnoses: Pre-Op Diagnoses: Left periprosthetic distal femur fracture  Post-Op Diagnosis: Same  Procedures: CPT 27511-Open reduction internal fixation of left periprosthetic distal femur fracture  Surgeons : Primary: Roby Lofts, MD  Assistant: Ulyses Southward, PA-C  Location: OR 7   Anesthesia:General   Antibiotics: Scheduled Maxipime preop  Tourniquet time:  None  Estimated Blood Loss:50 mL  Complications: None  Specimens: None   Implants: Implant Name Type Inv. Item Serial No. Manufacturer Lot No. LRB No. Used Action  CAP LOCK NCB - [id number] Cap CAP LOCK NCB  [employer](ORTH,TRAU,BIO,SG)  Left 7 Implanted  PLATE DIST FEM 78G - [id number] Plate PLATE DIST FEM 57Q  [employer](ORTH,TRAU,BIO,SG)  Left 1 Implanted  SCREW 5.0 - [id number] Screw SCREW 5.0  [employer](ORTH,TRAU,BIO,SG)  Left 2 Implanted  SCREW 5.0 - [id number] Screw SCREW 5.0  [employer](ORTH,TRAU,BIO,SG)  Left 2 Implanted  SCREW NCB 5.0X38 - [id number] Screw SCREW NCB 5.0X38  [employer](ORTH,TRAU,BIO,SG)  Left 1 Implanted  SCREW NCB 5.0X75MM - [id number] Screw SCREW NCB 5.0X75MM  [employer](ORTH,TRAU,BIO,SG)  Left 1 Implanted  SCREW NCB 4.0X36MM - [id number] Screw SCREW NCB 4.0X36MM  [employer](ORTH,TRAU,BIO,SG)  Left 1 Implanted     Indications for Surgery: 75 year old female who sustained a ground-level fall and a left periprosthetic distal femur fracture.  Due to the unstable nature of her injury I recommended proceeding with open reduction internal fixation.  Risks and benefits were discussed with the patient and her daughter.  Risks included but not limited to bleeding, infection, malunion, nonunion, hardware failure, hardware irritation, nerve or blood vessel injury, DVT, even possibility anesthetic complications.  She agreed to proceed with surgery and consent was  obtained.  Operative Findings: Open reduction internal fixation of left periprosthetic distal femur fracture using [employer] NCB 12 hole distal femoral locking plate  Procedure: The patient was identified in the preoperative holding area. Consent was confirmed with the patient and their family and all questions were answered. The operative extremity was marked after confirmation with the patient. she was then brought back to the operating room by our anesthesia colleagues.  She was placed under general anesthetic and carefully transferred over to a radiolucent flat top table.  A bump was placed under her operative hip.  The left lower extremity was then prepped and draped in usual sterile fashion.  A timeout was performed to verify the patient, the procedure, and the extremity.  Preoperative antibiotics were dosed.  The hip and knee were flexed over a triangle and fluoroscopic imaging was obtained to show the unstable nature of her injury.  A lateral approach to the distal femur was carried down through skin subcutaneous tissue.  The IT band was released in line with my incision.  I then exposed the lateral condyle of the femur.  I then attached a 12 hole [employer] NCB distal femoral locking plate to a targeting arm and slid this submuscularly along the lateral cortex of the femur.  I held provisionally distally with a 2.0 mm K wire.  I placed a percutaneous 3.3 mm drill bit to align the proximal portion of the plate.  Once I had alignment appropriate I then placed a 5.0 millimeter screws distally to bring the plate flush to bone.  I then percutaneously placed 5.0 millimeter screws in the femoral shaft to correct the coronal alignment.  The proximal 3.3 mm drill bit  was removed and a 4.0 millimeter screw was placed.  Locking caps were placed on the 5.0 millimeter screws in the femoral shaft on the targeting arm was removed.  I drilled and placed 5.0 millimeter screws distally.  A total of 5 screws  were placed.  Locking caps were placed on all of the distal screws.  Final fluoroscopic imaging was obtained.  The incision was copiously irrigated.  A layered closure of 0 Vicryl, 2-0 Vicryl and 3-0 Monocryl was used to close the skin Dermabond was used to seal the skin.  Sterile dressings were applied.  The patient was then awoken from anesthesia and taken to the PACU in stable condition.  Post Op Plan/Instructions: Patient may be 50% partial weightbearing on the left lower extremity as tolerated.  She will receive antibiotics for her cholecystitis which should cover for surgical prophylaxis.  She will be placed on Lovenox for DVT prophylaxis.  We will have her mobilize with physical and Occupational Therapy.  I was present and performed the entire surgery.  Ulyses Southward, PA-C did assist me throughout the case. An assistant was necessary given the difficulty in approach, maintenance of reduction and ability to instrument the fracture.   Truitt Merle, MD [facility]

## 2020-10-13 NOTE — Progress Notes (Signed)
Dr. Mayford Knife requested BMET 1 week and appt in 2 weeks. Due to appt availability, Lab will be at [facility] office and OV will be at [facility] with APP. Appt info outlined - along with location FYI - on AVS.

## 2020-10-14 ENCOUNTER — Encounter (HOSPITAL_COMMUNITY): Payer: Self-pay | Admitting: Student

## 2020-10-14 DIAGNOSIS — S72402A Unspecified fracture of lower end of left femur, initial encounter for closed fracture: Secondary | ICD-10-CM | POA: Diagnosis not present

## 2020-10-14 LAB — CBC
HCT: 25.5 % — ABNORMAL LOW (ref 36.0–46.0)
Hemoglobin: 8.3 g/dL — ABNORMAL LOW (ref 12.0–15.0)
MCH: 27.4 pg (ref 26.0–34.0)
MCHC: 32.5 g/dL (ref 30.0–36.0)
MCV: 84.2 fL (ref 80.0–100.0)
Platelets: 146 10*3/uL — ABNORMAL LOW (ref 150–400)
RBC: 3.03 MIL/uL — ABNORMAL LOW (ref 3.87–5.11)
RDW: 17.7 % — ABNORMAL HIGH (ref 11.5–15.5)
WBC: 9.2 10*3/uL (ref 4.0–10.5)
nRBC: 0 % (ref 0.0–0.2)

## 2020-10-14 LAB — COMPREHENSIVE METABOLIC PANEL
ALT: 12 U/L (ref 0–44)
AST: 15 U/L (ref 15–41)
Albumin: 2.4 g/dL — ABNORMAL LOW (ref 3.5–5.0)
Alkaline Phosphatase: 45 U/L (ref 38–126)
Anion gap: 7 (ref 5–15)
BUN: 16 mg/dL (ref 8–23)
CO2: 22 mmol/L (ref 22–32)
Calcium: 7.7 mg/dL — ABNORMAL LOW (ref 8.9–10.3)
Chloride: 111 mmol/L (ref 98–111)
Creatinine, Ser: 0.76 mg/dL (ref 0.44–1.00)
GFR, Estimated: >60 mL/min (ref >60)
Glucose, Bld: 154 mg/dL — ABNORMAL HIGH (ref 70–99)
Potassium: 4.2 mmol/L (ref 3.5–5.1)
Sodium: 140 mmol/L (ref 135–145)
Total Bilirubin: 0.4 mg/dL (ref 0.3–1.2)
Total Protein: 4.9 g/dL — ABNORMAL LOW (ref 6.5–8.1)

## 2020-10-14 MED ORDER — OXYCODONE HCL 5 MG PO TABS
5.0000 mg | ORAL_TABLET | ORAL | Status: DC | PRN
  Administered 2020-10-14 – 2020-10-17 (×12): 5 mg via ORAL
  Filled 2020-10-14 (×12): qty 1

## 2020-10-14 MED ORDER — ASPIRIN EC 81 MG PO TBEC
81.0000 mg | DELAYED_RELEASE_TABLET | Freq: Every day | ORAL | Status: DC
  Administered 2020-10-15 – 2020-10-18 (×4): 81 mg via ORAL
  Filled 2020-10-14 (×4): qty 1
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
## 2020-10-14 NOTE — Care Management Important Message (Signed)
Important Message  Patient Details  Name: Heather [name] MRN: [id number] Date of Birth: [birth year]   Medicare Important Message Given:  Yes     [name] 10/14/2020, 10:31 AM

## 2020-10-14 NOTE — Plan of Care (Signed)
  Problem: Clinical Measurements: Goal: Ability to maintain clinical measurements within normal limits will improve Outcome: Progressing   Problem: Clinical Measurements: Goal: Diagnostic test results will improve Outcome: Progressing   Problem: Clinical Measurements: Goal: Respiratory complications will improve Outcome: Progressing   Problem: Clinical Measurements: Goal: Cardiovascular complication will be avoided Outcome: Progressing   Problem: Activity: Goal: Risk for activity intolerance will decrease Outcome: Progressing   Problem: Pain Managment: Goal: General experience of comfort will improve Outcome: Progressing   Problem: Safety: Goal: Ability to remain free from injury will improve Outcome: Progressing   Problem: Skin Integrity: Goal: Risk for impaired skin integrity will decrease Outcome: Progressing

## 2020-10-14 NOTE — Progress Notes (Signed)
PROGRESS NOTE    Heather [name]  ZOX:[id number] DOB: 1945/06/02 DOA: 10/09/2020 PCP: Janece Canterbury, MD   Chief Complaint  Patient presents with   Fall   Brief Narrative:  Heather [name] is Heather [name] 75 y.o. female with medical history significant for depression, hyperlipidemia, hypertension, asthma, L3 burst fracture s/p repair, who was brought to the hospital after Heather [name] fall at home.   She was diagnosed with Heather [name] distal femoral fracture.  She was also found to have AKI, hyperkalemia, elevated troponins concerning for NSTEMI.  She was treated with IV fluids and analgesics.  She underwent left heart cath which showed minimal nonobstructive CAD.    She developed fever and tachycardia overnight 8/23-24.  She was started on broad spectrum abx and treated for sepsis.  RUQ US showed cholecystitis.  Surgery was c/s and she's now s/p perc chole drain.   Assessment & Plan:   Active Problems:   Essential hypertension   Hypotension   Femur fracture (HCC)   Prolonged QT interval   Abnormal ECG   AKI (acute kidney injury) (HCC)   Hyperkalemia   Elevated troponin   Leukocytosis   Malnutrition of moderate degree  Suspect she'll need rehab, either SNF vs CIR - > maybe good CIR candidate?  Sepsis 2/2 Acute Cholecystitis Fever, tachycardia, tachypnea - meets criteria for sepsis RUQ [place] with acute cholecystitis CXR with cardiomegaly with vascular congestion She's now s/p perc chole drain by IR 8/24 Appreciate surgery recs - defer lap chole at this time, perc chole by IR -> planning for drain placement with interval lap chole in 6-8 weeks.  Continue abx -> transition to PO for additional 7 days. Appreciate IR recs as well (8/26 note) Repeat blood cultures no growth  UA on admission not concerning for UTI.  1/2 sets of blood cultures from admission with coag negative staph, suspect contaminant.  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  Acute Kidney Injury Creatinine 2.21 at presentation  Improving - follow metabolic panel tomorrow  Hypocalcemia  Hypophosphatemia Follow - no metabolic panel today Follow tomorrow  Prolonged Qtc Repeat EKG - wnl 8/25  Thrombocytopenia Continue to monitor - fluctuating, improved today  DVT prophylaxis: (heparin Code Status: (full  Family Communication: daughter on face time in room Disposition:   Status is: Inpatient  Remains inpatient appropriate because:Inpatient level of care appropriate due to severity of illness  Dispo: The patient is from: Home              Anticipated d/c is to: Home              Patient currently is not medically stable to d/c.   Difficult to place patient No       Consultants:  Cardiology IR General surgery orthopedics  Procedures:  ORIF periprosthetic distal femur fx 8/25 8/24   IMPRESSION: Successful placement of Heather [name] 10.2 French percutaneous, subhepatic/transperitoneal cholecystostomy tube.  8/23   Prox LAD lesion is 30% stenosed.   Prox RCA to Mid RCA lesion is 20% stenosed.  LV end diastolic pressure is normal.   Minimal nonobstructive CAD Normal LVEDP   Plan: medical management.  Echo IMPRESSIONS     1. Mild mid-apical anterolateral and posterolateral hypokinesis. Left  ventricular ejection fraction, by estimation, is 50 to 55%. The left  ventricle has low normal function. The left ventricle demonstrates  regional wall motion abnormalities (see scoring    diagram/findings for description). There is mild concentric left  ventricular hypertrophy. Left ventricular diastolic parameters are  consistent with Grade II diastolic dysfunction (pseudonormalization).  Elevated left ventricular end-diastolic pressure.   2. Right ventricular systolic function is normal. The right ventricular  size is normal. There is mildly elevated pulmonary artery systolic  pressure.   3. Left atrial size was mildly dilated.   4. The mitral valve is normal in structure. No evidence of mitral valve  regurgitation. No evidence of mitral stenosis.   5. The aortic valve is tricuspid. Aortic valve regurgitation is not  visualized. No aortic stenosis is present.   6. The inferior vena cava is normal in size with greater than 50%  respiratory variability, suggesting right atrial pressure of 3 mmHg.   Antimicrobials:  Anti-infectives (From admission, onward)    Start     Dose/Rate Route Frequency Ordered Stop   10/13/20 1004  vancomycin (VANCOCIN) powder  Status:  Discontinued          As needed 10/13/20 1005 10/13/20 1055   10/13/20 0400  vancomycin (VANCOCIN) IVPB 1000 mg/200 mL premix  Status:  Discontinued        1,000 mg 200 mL/hr over 60 Minutes Intravenous Every 24 hours 10/12/20 0241 10/12/20 0810   10/12/20 1600  ceFEPIme (MAXIPIME) 2 g in sodium chloride 0.9 % 100 mL IVPB        2 g 200 mL/hr over 30 Minutes Intravenous Every 12 hours 10/12/20 0241     10/12/20 1330  cefOXitin (MEFOXIN) 2 g in sodium chloride 0.9 % 100 mL IVPB        2 g 200 mL/hr over 30 Minutes Intravenous To Radiology 10/12/20 1241 10/13/20 1330   10/12/20 0330  metroNIDAZOLE (FLAGYL) IVPB 500 mg        500 mg 100 mL/hr over 60 Minutes Intravenous Every 8 hours 10/12/20 0235     10/12/20 0330  vancomycin (VANCOCIN) IVPB 1000 mg/200 mL premix        1,000 mg 200 mL/hr over 60 Minutes Intravenous  Once 10/12/20 0237 10/12/20 1149   10/12/20 0330  ceFEPIme (MAXIPIME) 2 g in sodium  chloride 0.9 % 100 mL IVPB        2 g 200 mL/hr over 30 Minutes Intravenous  Once 10/12/20 0237 10/12/20 1148   10/12/20 0100  vancomycin (VANCOREADY) IVPB 750 mg/150 mL  Status:  Discontinued        750 mg 150 mL/hr over 60 Minutes Intravenous Every 48 hours 10/09/20 2343 10/10/20 0958   10/10/20 0000  vancomycin (VANCOREADY) IVPB 1750 mg/350 mL        1,750 mg 175 mL/hr over 120 Minutes Intravenous  Once 10/09/20 2302 10/10/20 0308   10/09/20 2315  ceFEPIme (MAXIPIME) 2 g in sodium chloride 0.9 % 100 mL IVPB        2 g 200 mL/hr over 30 Minutes Intravenous  Once 10/09/20 2302 10/10/20 0026   10/09/20 2300  metroNIDAZOLE (FLAGYL) IVPB 500 mg  Status:  Discontinued        500 mg 100 mL/hr over 60 Minutes Intravenous Every  12 hours 10/09/20 2251 10/10/20 0958   10/09/20 2200  ceFEPIme (MAXIPIME) 2 g in sodium chloride 0.9 % 100 mL IVPB  Status:  Discontinued        2 g 200 mL/hr over 30 Minutes Intravenous Every 24 hours 10/09/20 2343 10/10/20 0958          Subjective: No new complaints today  Objective: Vitals:   10/13/20 2356 10/14/20 0434 10/14/20 0948 10/14/20 1300  BP: (!) 155/85 (!) 147/66 (!) 161/77 140/76  Pulse:   77 85  Resp:    18  Temp: 97.9 F (36.6 C) 98.9 F (37.2 C)  99 F (37.2 C)  TempSrc: Oral Oral  Oral  SpO2:    95%  Weight:      Height:        Intake/Output Summary (Last 24 hours) at 10/14/2020 1711 Last data filed at 10/14/2020 1610 Gross per 24 hour  Intake 532.66 ml  Output 1095 ml  Net -562.34 ml   Filed Weights   10/10/20 2000  Weight: 58.2 kg    Examination:  General: No acute distress. Cardiovascular: Heart sounds show Caira Poche regular rate, and rhythm Lungs: Clear to auscultation bilaterally  Abdomen: Soft, nontender, nondistended  Neurological: Alert and oriented 3. Moves all extremities 4. Cranial nerves II through XII grossly intact. Skin: Warm and dry. No rashes or lesions. Extremities: LLE with dressing intact     Data  Reviewed: I have personally reviewed following labs and imaging studies  CBC: Recent Labs  Lab 10/09/20 1840 10/10/20 0639 10/11/20 0037 10/12/20 0110 10/13/20 0248 10/14/20 0213  WBC 18.6* 13.1* 12.4* 10.6* 10.9* 9.2  NEUTROABS 13.7* 10.3*  --  8.4*  --   --   HGB 13.1 9.4* 9.9* 9.3* 8.4* 8.3*  HCT 42.4 30.2* 30.2* 29.1* 25.8* 25.5*  MCV 87.2 86.8 83.9 83.9 83.0 84.2  PLT 149* 116* 119* 115* 127* 146*    Basic Metabolic Panel: Recent Labs  Lab 10/10/20 0000 10/10/20 0540 10/11/20 0037 10/12/20 0110 10/14/20 0213  NA 141 139 138 138 140  K 4.1 4.0 3.7 4.1 4.2  CL 116* 115* 114* 109 111  CO2 19* 18* 19* 22 22  GLUCOSE 73 84 100* 166* 154*  BUN 24* 20 10 12 16   CREATININE 1.40* 1.10* 0.86 0.81 0.76  CALCIUM 6.1* 6.6* 6.6* 7.9* 7.7*  MG 1.7  --   --  1.9  --   PHOS 2.5  --  1.4* 2.1*  --     GFR: Estimated Creatinine Clearance: 57.5 mL/min (by C-G formula based on SCr of 0.76 mg/dL).  Liver Function Tests: Recent Labs  Lab 10/09/20 1840 10/10/20 0540 10/11/20 0037 10/12/20 0110 10/14/20 0213  AST 30 15  --  22 15  ALT 13 12  --  15 12  ALKPHOS 54 44  --  59 45  BILITOT 1.3* 0.6  --  0.8 0.4  PROT 5.7* 4.7*  --  5.5* 4.9*  ALBUMIN 3.0* 2.5* 2.2* 3.0* 2.4*    CBG: No results for input(s): GLUCAP in the last 168 hours.   Recent Results (from the past 240 hour(s))  Resp Panel by RT-PCR (Flu Heather [name]&B, Covid) Nasopharyngeal Swab     Status: None   Collection Time: 10/09/20  6:41 PM   Specimen: Nasopharyngeal Swab; Nasopharyngeal(NP) swabs in vial transport medium  Result Value Ref Range Status   SARS Coronavirus 2 by RT PCR NEGATIVE NEGATIVE Final    Comment: (NOTE) SARS-CoV-2 target nucleic acids are  NOT DETECTED.  The SARS-CoV-2 RNA is generally detectable in upper respiratory specimens during the acute phase of infection. The lowest concentration of SARS-CoV-2 viral copies this assay can detect is 138 copies/mL. Heather [name] negative result does not preclude  SARS-Cov-2 infection and should not be used as the sole basis for treatment or other patient management decisions. Heather [name] negative result may occur with  improper specimen collection/handling, submission of specimen other than nasopharyngeal swab, presence of viral mutation(s) within the areas targeted by this assay, and inadequate number of viral copies(<138 copies/mL). Kmari Brian negative result must be combined with clinical observations, patient history, and epidemiological information. The expected result is Negative.  Fact Sheet for Patients:  BloggerCourse.com  Fact Sheet for Healthcare Providers:  SeriousBroker.it  This test is no t yet approved or cleared by the [place] FDA and  has been authorized for detection and/or diagnosis of SARS-CoV-2 by FDA under an Emergency Use Authorization (EUA). This EUA will remain  in effect (meaning this test can be used) for the duration of the COVID-19 declaration under Section 564(b)(1) of the Act, 21 U.S.C.section 360bbb-3(b)(1), unless the authorization is terminated  or revoked sooner.       Influenza Heather [name] by PCR NEGATIVE NEGATIVE Final   Influenza B by PCR NEGATIVE NEGATIVE Final    Comment: (NOTE) The Xpert Xpress SARS-CoV-2/FLU/RSV plus assay is intended as an aid in the diagnosis of influenza from Nasopharyngeal swab specimens and should not be used as Heather [name] sole basis for treatment. Nasal washings and aspirates are unacceptable for Xpert Xpress SARS-CoV-2/FLU/RSV testing.  Fact Sheet for Patients: BloggerCourse.com  Fact Sheet for Healthcare Providers: SeriousBroker.it  This test is not yet approved or cleared by the [place] FDA and has been authorized for detection and/or diagnosis of SARS-CoV-2 by FDA under an Emergency Use Authorization (EUA). This EUA will remain in effect (meaning this test can be used) for the duration of  the COVID-19 declaration under Section 564(b)(1) of the Act, 21 U.S.C. section 360bbb-3(b)(1), unless the authorization is terminated or revoked.  Performed at [facility], [address], [place], [place] [address]   Culture, blood (routine x 2)     Status: None (Preliminary result)   Collection Time: 10/09/20  7:55 PM   Specimen: BLOOD  Result Value Ref Range Status   Specimen Description   Final    BLOOD RIGHT ANTECUBITAL Performed at [facility], [address] [place], [place] [address]    Special Requests   Final    BOTTLES DRAWN AEROBIC AND ANAEROBIC Blood Culture results may not be optimal due to an inadequate volume of blood received in culture bottles Performed at [facility], [address] [place], [place] [address]    Culture   Final    NO GROWTH 4 DAYS Performed at [facility], [address] [place], [place] [address]    Report Status PENDING  Incomplete  Culture, blood (routine x 2)     Status: Abnormal   Collection Time: 10/09/20  7:55 PM   Specimen: BLOOD  Result Value Ref Range Status   Specimen Description   Final    BLOOD LEFT ANTECUBITAL Performed at [facility], [address] [place], [place] [address]    Special Requests   Final    BOTTLES DRAWN AEROBIC AND ANAEROBIC Blood Culture results may not be optimal due to an inadequate volume of blood received in culture bottles Performed at [facility], [address], [place], [place] [address]    Culture  Setup Time   Final    GRAM POSITIVE COCCI IN CLUSTERS IN BOTH AEROBIC AND ANAEROBIC BOTTLES CRITICAL RESULT CALLED TO, READ BACK BY AND VERIFIED WITH: L SEAY,PHARMD@2330  10/10/20 MK    Culture (Heather [name])  Final    STAPHYLOCOCCUS EPIDERMIDIS STAPHYLOCOCCUS HOMINIS THE SIGNIFICANCE OF ISOLATING THIS ORGANISM FROM Heather [name] SINGLE SET OF BLOOD CULTURES WHEN MULTIPLE SETS ARE DRAWN IS UNCERTAIN. PLEASE NOTIFY  THE MICROBIOLOGY DEPARTMENT WITHIN ONE WEEK IF SPECIATION AND SENSITIVITIES ARE REQUIRED. Performed at [facility], [address], [place], [place] [address]    Report Status 10/13/2020 FINAL  Final  Blood Culture ID Panel (Reflexed)     Status: Abnormal   Collection Time: 10/09/20  7:55 PM  Result Value Ref Range Status   Enterococcus faecalis NOT DETECTED NOT DETECTED Final   Enterococcus Faecium NOT DETECTED NOT DETECTED Final   Listeria monocytogenes NOT DETECTED NOT DETECTED Final   Staphylococcus species DETECTED (Heather [name]) NOT DETECTED Final    Comment: CRITICAL RESULT CALLED TO, READ BACK BY AND VERIFIED WITH: L SEAY,PHARMD@2330  10/10/20 MK    Staphylococcus aureus (BCID) NOT DETECTED NOT DETECTED Final   Staphylococcus epidermidis DETECTED (Heather [name]) NOT DETECTED Final    Comment: Methicillin (oxacillin) resistant coagulase negative staphylococcus. Possible blood culture contaminant (unless isolated from more than one blood culture draw or clinical case suggests pathogenicity). No antibiotic treatment is indicated for blood  culture contaminants. CRITICAL RESULT CALLED TO, READ BACK BY AND VERIFIED WITH: L SEAY,PHARMD@2330  10/10/20 MK    Staphylococcus lugdunensis NOT DETECTED NOT DETECTED Final   Streptococcus species NOT DETECTED NOT DETECTED Final   Streptococcus agalactiae NOT DETECTED NOT DETECTED Final   Streptococcus pneumoniae NOT DETECTED NOT DETECTED Final   Streptococcus pyogenes NOT DETECTED NOT DETECTED Final   Heather [name].calcoaceticus-baumannii NOT DETECTED NOT DETECTED Final   Bacteroides fragilis NOT DETECTED NOT DETECTED Final   Enterobacterales NOT DETECTED NOT DETECTED Final   Enterobacter cloacae complex NOT DETECTED NOT DETECTED Final   Escherichia coli NOT DETECTED NOT DETECTED Final   Klebsiella aerogenes NOT DETECTED NOT DETECTED Final   Klebsiella oxytoca NOT DETECTED NOT DETECTED Final   Klebsiella pneumoniae NOT DETECTED NOT DETECTED Final   Proteus species NOT  DETECTED NOT DETECTED Final   Salmonella species NOT DETECTED NOT DETECTED Final   Serratia marcescens NOT DETECTED NOT DETECTED Final   Haemophilus influenzae NOT DETECTED NOT DETECTED Final   Neisseria meningitidis NOT DETECTED NOT DETECTED Final   Pseudomonas aeruginosa NOT DETECTED NOT DETECTED Final   Stenotrophomonas maltophilia NOT DETECTED NOT DETECTED Final   Candida albicans NOT DETECTED NOT DETECTED Final   Candida auris NOT DETECTED NOT DETECTED Final   Candida glabrata NOT DETECTED NOT DETECTED Final   Candida krusei NOT DETECTED NOT DETECTED Final   Candida parapsilosis NOT DETECTED NOT DETECTED Final   Candida tropicalis NOT DETECTED NOT DETECTED Final   Cryptococcus neoformans/gattii NOT DETECTED NOT DETECTED Final   Methicillin resistance mecA/C DETECTED (Heather [name]) NOT DETECTED Final    Comment: CRITICAL RESULT CALLED TO, READ BACK BY AND VERIFIED WITH: L SEAY,PHARMD @2330  10/10/20 MK Performed at [facility], [address] [place], [place]   MRSA Next Gen by PCR, Nasal     Status: None   Collection Time: 10/10/20  4:17 PM   Specimen: Nasal Mucosa; Nasal Swab  Result Value Ref Range Status   MRSA by PCR Next Gen NOT DETECTED NOT  DETECTED Final    Comment: (NOTE) The GeneXpert MRSA Assay (FDA approved for NASAL specimens only), is one component of Darrly Loberg comprehensive MRSA colonization surveillance program. It is not intended to diagnose MRSA infection nor to guide or monitor treatment for MRSA infections. Test performance is not FDA approved in patients less than 39 years old. Performed at [facility], [address] [place], [place] [address]   Culture, blood (x 2)     Status: None (Preliminary result)   Collection Time: 10/12/20  1:10 AM   Specimen: BLOOD  Result Value Ref Range Status   Specimen Description BLOOD RIGHT ANTECUBITAL  Final   Special Requests   Final    BOTTLES DRAWN AEROBIC AND ANAEROBIC Blood Culture adequate volume   Culture    Final    NO GROWTH 2 DAYS Performed at [facility], [address] [place], [place] [address]    Report Status PENDING  Incomplete  Culture, blood (x 2)     Status: None (Preliminary result)   Collection Time: 10/12/20  1:10 AM   Specimen: BLOOD RIGHT HAND  Result Value Ref Range Status   Specimen Description BLOOD RIGHT HAND  Final   Special Requests   Final    BOTTLES DRAWN AEROBIC AND ANAEROBIC Blood Culture adequate volume   Culture   Final    NO GROWTH 2 DAYS Performed at [facility], [address] [place], [place] [address]    Report Status PENDING  Incomplete         Radiology Studies: DG C-Arm 1-60 Min-No Report  Result Date: 10/13/2020 Fluoroscopy was utilized by the requesting physician.  No radiographic interpretation.   DG FEMUR MIN 2 VIEWS LEFT  Result Date: 10/13/2020 CLINICAL DATA:  Elective surgery. EXAM: LEFT FEMUR 2 VIEWS COMPARISON:  Preoperative imaging. FINDINGS: Six fluoroscopic spot views of the left femur obtained in the operating room. Lateral plate and multi screw fixation of distal femur fracture. 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 Total fluoroscopy time 1 minutes 5 seconds. Dose 5.98 mGy. IMPRESSION: Intraoperative fluoroscopy for distal femur fracture ORIF. Electronically Signed   By: Narda Rutherford M.D.   On: 10/13/2020 12:39   DG FEMUR PORT MIN 2 VIEWS LEFT  Result Date: 10/13/2020 CLINICAL DATA:  Postop left femur fracture repair. EXAM: LEFT FEMUR PORTABLE 2 VIEWS COMPARISON:  Preoperative imaging. FINDINGS: Lateral plate and multi screw fixation of comminuted distal femur fracture. Fracture is in improved alignment from preoperative imaging. 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 Recent postsurgical change includes air and edema in the soft tissues. IMPRESSION: ORIF of comminuted distal femur fracture, in improved alignment from preoperative imaging. Electronically Signed   By: Narda Rutherford M.D.   On: 10/13/2020 12:40         Scheduled Meds:  buPROPion  300 mg Oral Daily   calcium carbonate  1 tablet Oral BID WC   Chlorhexidine Gluconate Cloth  6 each Topical Daily   docusate sodium  100 mg Oral BID   enoxaparin (LOVENOX) injection  40 mg Subcutaneous Q24H   feeding supplement  237 mL Oral TID BM   gabapentin  300 mg Oral Daily   And   gabapentin  600 mg Oral QHS   metoprolol succinate  50 mg Oral Daily   multivitamin with minerals  1 tablet Oral Daily   polyethylene glycol  17 g Oral BID   rosuvastatin  10 mg Oral Daily   sacubitril-valsartan  1 tablet  Oral BID   sodium chloride flush  3 mL Intravenous Q12H   sodium chloride flush  5 mL Intracatheter Q8H   spironolactone  12.5 mg Oral Daily   Continuous Infusions:  sodium chloride     ceFEPime (MAXIPIME) IV 2 g (10/14/20 0957)   lactated ringers 10 mL/hr at 10/14/20 0600   methocarbamol (ROBAXIN) IV     metronidazole 500 mg (10/14/20 1433)     LOS: 5 days    Time spent: over 30 min    Lacretia Nicks, MD [facility]   To contact the attending provider between 7A-7P or the covering provider during after hours 7P-7A, please log into the web site [email or web address] and access using universal [place] password for that web site. If you do not have the password, please call the hospital operator.  10/14/2020, 5:11 PM

## 2020-10-14 NOTE — Progress Notes (Signed)
Orthopaedic Trauma Progress Note  SUBJECTIVE: Doing okay this morning.  Notes moderate to severe pain in the left lower extremity.  Does not feel medications are helping significantly.  Does take hydrocodone 5-325 every 4 hours as needed at baseline.  Notes some decreased sensation through the left foot but notes this is somewhat baseline after sustaining fracture to her back a few months ago.  No chest pain. No SOB. No nausea/vomiting. No other complaints.   OBJECTIVE:  Vitals:   10/13/20 2356 10/14/20 0434  BP: (!) 155/85 (!) 147/66  Pulse:    Resp:    Temp: 97.9 F (36.6 C) 98.9 F (37.2 C)  SpO2:      General: Sitting up in bed, no acute distress.  Pleasant and cooperative Respiratory: No increased work of breathing.  Left lower extremity: Dressing is clean, dry, intact.  Tenderness with palpation of the thigh as expected.  Ankle dorsiflexion/plantarflexion is intact.  Endorses sensation throughout extremity with exception of decrease sensation through the midfoot.  Patient notes this is close to her baseline. +DP pulse  IMAGING: Stable post op imaging.   LABS:  Results for orders placed or performed during the hospital encounter of 10/09/20 (from the past 24 hour(s))  CBC     Status: Abnormal   Collection Time: 10/14/20  2:13 AM  Result Value Ref Range   WBC 9.2 4.0 - 10.5 K/uL   RBC 3.03 (L) 3.87 - 5.11 MIL/uL   Hemoglobin 8.3 (L) 12.0 - 15.0 g/dL   HCT 73.2 (L) 20.2 - 54.2 %   MCV 84.2 80.0 - 100.0 fL   MCH 27.4 26.0 - 34.0 pg   MCHC 32.5 30.0 - 36.0 g/dL   RDW 70.6 (H) 23.7 - 62.8 %   Platelets 146 (L) 150 - 400 K/uL   nRBC 0.0 0.0 - 0.2 %  Comprehensive metabolic panel     Status: Abnormal   Collection Time: 10/14/20  2:13 AM  Result Value Ref Range   Sodium 140 135 - 145 mmol/L   Potassium 4.2 3.5 - 5.1 mmol/L   Chloride 111 98 - 111 mmol/L   CO2 22 22 - 32 mmol/L   Glucose, Bld 154 (H) 70 - 99 mg/dL   BUN 16 8 - 23 mg/dL   Creatinine, Ser 3.15 0.44 - 1.00  mg/dL   Calcium 7.7 (L) 8.9 - 10.3 mg/dL   Total Protein 4.9 (L) 6.5 - 8.1 g/dL   Albumin 2.4 (L) 3.5 - 5.0 g/dL   AST 15 15 - 41 U/L   ALT 12 0 - 44 U/L   Alkaline Phosphatase 45 38 - 126 U/L   Total Bilirubin 0.4 0.3 - 1.2 mg/dL   GFR, Estimated >17 >61 mL/min   Anion gap 7 5 - 15    ASSESSMENT: Heather [name] is a 75 y.o. female, 1 Day Post-Op s/p ORIF LEFT DISTAL FEMUR FRACTURE  CV/Blood loss: Hgb 8.3 this morning, stable form pre-op.  Patient hypertensive but otherwise hemodynamically stable  PLAN: Weightbearing: PWB 50%  LLE ROM: Okay for unrestricted hip and knee motion as tolerated Incisional and dressing care: Plan to remove dressings tomorrow Showering: Okay to begin showering with assistance 10/16/2020 if no drainage from incisions Orthopedic device(s): None  Pain management:  1. Tylenol 325-650 mg q 6 hours PRN 2. Robaxin 500 mg q 6 hours PRN 3. Oxycodone 5 mg q 4 hours PRN severe pain 4. Norco 5-325 every 4 hours PRN moderate pain  5. Neurontin 300  mg QD + 600 mg QHS 6. Morphine 0.5-1 mg q 2 hours PRN VTE prophylaxis: Lovenox, SCDs ID: Ancef 2gm post op Foley/Lines:  Foley in place, remove once able. KVO IVFs Impediments to Fracture Healing: Vit D level low normal range at 31, no supplementation needed Dispo: PT/OT eval today, dispo pending.  Continue to work on pain control. Plan to change/remove dressing tomorrow LLE  Follow - up plan: Will continue to follow while in hospital and plan for outpatient follow-up 2 weeks after d/c  Contact information:  Truitt Merle MD, Ulyses Southward PA-C. After hours and holidays please check [email or web address] for group call information for [facility]   [name] A. Michaelyn Barter, PA-C [phone] (office) [email or web address]

## 2020-10-14 NOTE — Progress Notes (Signed)
1 Day Post-Op   Subjective: Not a lot of appetite but abdominal pain much better ROS negative except as listed above. Objective: Vital signs in last 24 hours: Temp:  [97.9 F (36.6 C)-98.9 F (37.2 C)] 98.9 F (37.2 C) (08/26 0434) Pulse Rate:  [75-95] 79 (08/25 2047) Resp:  [15-19] 19 (08/25 2047) BP: (129-157)/(66-91) 147/66 (08/26 0434) SpO2:  [94 %-100 %] 97 % (08/25 2047) Last BM Date: 10/12/20  Intake/Output from previous day: 08/25 0701 - 08/26 0700 In: 3185.7 [P.O.:120; I.V.:1110.7; IV Piggyback:1950] Out: 1270 [Urine:800; Drains:420; Blood:50] Intake/Output this shift: No intake/output data recorded.  General appearance: alert and cooperative Resp: clear to auscultation bilaterally Cardio: regular rate and rhythm GI: soft, perc chole drain working  Lab Results: CBC  Recent Labs    10/13/20 0248 10/14/20 0213  WBC 10.9* 9.2  HGB 8.4* 8.3*  HCT 25.8* 25.5*  PLT 127* 146*   BMET Recent Labs    10/12/20 0110 10/14/20 0213  NA 138 140  K 4.1 4.2  CL 109 111  CO2 22 22  GLUCOSE 166* 154*  BUN 12 16  CREATININE 0.81 0.76  CALCIUM 7.9* 7.7*   PT/INR Recent Labs    10/12/20 0110  LABPROT 16.1*  INR 1.3*   ABG No results for input(s): PHART, HCO3 in the last 72 hours.  Invalid input(s): PCO2, PO2  Studies/Results: IR Perc Cholecystostomy  Result Date: 10/12/2020 INDICATION: 75 year old female with acute calculus cholecystitis, poor surgical candidate. EXAM: Percutaneous cholecystostomy tube placement MEDICATIONS: 2 g cefoxitin, intravenous; The antibiotic was administered within an appropriate time frame prior to the initiation of the procedure. ANESTHESIA/SEDATION: Moderate (conscious) sedation was employed during this procedure. A total of Versed 2.5 mg and Fentanyl 100 mcg was administered intravenously. Moderate Sedation Time: 11 minutes. The patient's level of consciousness and vital signs were monitored continuously by radiology nursing  throughout the procedure under my direct supervision. FLUOROSCOPY TIME:  Fluoroscopy Time: 0 minutes 36 seconds (12.6 mGy). COMPLICATIONS: None immediate. PROCEDURE: Informed written consent was obtained from the patient after a thorough discussion of the procedural risks, benefits and alternatives. All questions were addressed. Maximal Sterile Barrier Technique was utilized including caps, mask, sterile gowns, sterile gloves, sterile drape, hand hygiene and skin antiseptic. A timeout was performed prior to the initiation of the procedure. The patient was placed supine on the angiographic table. The patient's right upper quadrant was then prepped and draped in normal sterile fashion with maximum sterile barrier. Ultrasound demonstrates a distended gallbladder with echogenic gallstones in the fundus. Subdermal Local anesthesia was provided at the planned skin entry site. Under ultrasound guidance, deeper local anesthetic was provided through anterior abdominal muscles. Ultrasound was used to puncture the gallbladder using a 21 gauge Chiba needle via a subhepatic approach with visualization of the lung treated to the gallbladder. A 0.018 inch wire was advanced into the lumen and a transition dilator placed. A gentle hand injection of contrast was performed. Cholecystogram demonstrates irregular filling defects consistent with gallstones. The cystic duct is patent. A 0.035 inch exchange wire was placed in the tract was dilated. A 10.2 French multipurpose drainage catheter was advanced into the gallbladder lumen. The drain was then secured in place using a 0-silk suture and a Stayfix device. A sterile dressing was applied. The tube was placed to bag drainage. The patient tolerated procedure well without evidence of immediate complication was transferred back to the floor in stable condition. IMPRESSION: Successful placement of a 10.2 French percutaneous, subhepatic/transperitoneal cholecystostomy tube.  Marliss Coots, MD  [facility] Electronically Signed   By: Marliss Coots M.D.   On: 10/12/2020 15:21   DG C-Arm 1-60 Min-No Report  Result Date: 10/13/2020 Fluoroscopy was utilized by the requesting physician.  No radiographic interpretation.   DG FEMUR MIN 2 VIEWS LEFT  Result Date: 10/13/2020 CLINICAL DATA:  Elective surgery. EXAM: LEFT FEMUR 2 VIEWS COMPARISON:  Preoperative imaging. FINDINGS: Six fluoroscopic spot views of the left femur obtained in the operating room. Lateral plate and multi screw fixation of distal femur fracture. 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 Total fluoroscopy time 1 minutes 5 seconds. Dose 5.98 mGy. IMPRESSION: Intraoperative fluoroscopy for distal femur fracture ORIF. Electronically Signed   By: Narda Rutherford M.D.   On: 10/13/2020 12:39   DG FEMUR PORT MIN 2 VIEWS LEFT  Result Date: 10/13/2020 CLINICAL DATA:  Postop left femur fracture repair. EXAM: LEFT FEMUR PORTABLE 2 VIEWS COMPARISON:  Preoperative imaging. FINDINGS: Lateral plate and multi screw fixation of comminuted distal femur fracture. Fracture is in improved alignment from preoperative imaging. 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 Recent postsurgical change includes air and edema in the soft tissues. IMPRESSION: ORIF of comminuted distal femur fracture, in improved alignment from preoperative imaging. Electronically Signed   By: Narda Rutherford M.D.   On: 10/13/2020 12:40    Anti-infectives: Anti-infectives (From admission, onward)    Start     Dose/Rate Route Frequency Ordered Stop   10/13/20 1004  vancomycin (VANCOCIN) powder  Status:  Discontinued          As needed 10/13/20 1005 10/13/20 1055   10/13/20 0400  vancomycin (VANCOCIN) IVPB 1000 mg/200 mL premix  Status:  Discontinued        1,000 mg 200 mL/hr over 60 Minutes Intravenous Every 24 hours 10/12/20 0241 10/12/20 0810   10/12/20 1600  ceFEPIme (MAXIPIME) 2 g in sodium chloride 0.9 % 100  mL IVPB        2 g 200 mL/hr over 30 Minutes Intravenous Every 12 hours 10/12/20 0241     10/12/20 1330  cefOXitin (MEFOXIN) 2 g in sodium chloride 0.9 % 100 mL IVPB        2 g 200 mL/hr over 30 Minutes Intravenous To Radiology 10/12/20 1241 10/13/20 1330   10/12/20 0330  metroNIDAZOLE (FLAGYL) IVPB 500 mg        500 mg 100 mL/hr over 60 Minutes Intravenous Every 8 hours 10/12/20 0235     10/12/20 0330  vancomycin (VANCOCIN) IVPB 1000 mg/200 mL premix        1,000 mg 200 mL/hr over 60 Minutes Intravenous  Once 10/12/20 0237 10/12/20 1149   10/12/20 0330  ceFEPIme (MAXIPIME) 2 g in sodium chloride 0.9 % 100 mL IVPB        2 g 200 mL/hr over 30 Minutes Intravenous  Once 10/12/20 0237 10/12/20 1148   10/12/20 0100  vancomycin (VANCOREADY) IVPB 750 mg/150 mL  Status:  Discontinued        750 mg 150 mL/hr over 60 Minutes Intravenous Every 48 hours 10/09/20 2343 10/10/20 0958   10/10/20 0000  vancomycin (VANCOREADY) IVPB 1750 mg/350 mL        1,750 mg 175 mL/hr over 120 Minutes Intravenous  Once 10/09/20 2302 10/10/20 0308   10/09/20 2315  ceFEPIme (MAXIPIME) 2 g in sodium chloride 0.9 % 100 mL IVPB        2 g 200 mL/hr over 30 Minutes Intravenous  Once  10/09/20 2302 10/10/20 0026   10/09/20 2300  metroNIDAZOLE (FLAGYL) IVPB 500 mg  Status:  Discontinued        500 mg 100 mL/hr over 60 Minutes Intravenous Every 12 hours 10/09/20 2251 10/10/20 0958   10/09/20 2200  ceFEPIme (MAXIPIME) 2 g in sodium chloride 0.9 % 100 mL IVPB  Status:  Discontinued        2 g 200 mL/hr over 30 Minutes Intravenous Every 24 hours 10/09/20 2343 10/10/20 0958       Assessment/Plan: Acute cholecystitis - s/p Perc Chole Drain placement by IR 8/24 - Cont abx (may change to PO and plan 7 more days) - Follow up with Dr. Luisa Hart to discuss possible interval lap chole in ~6-8 weeks after she has recovered from medical side of things as well as her femur surgery. Will also need follow up with IR.  - we will sign  off   FEN - CLD VTE - heparin 5000 units TID ID - Cefipime/flagyl      SVT - on BB, per cards NSTEMI - per primary/cards L femur fx after fall - s/p OIRF 8/25. Per Ortho HTN HLD Thrombocytopenia improving - plts 146   LOS: 5 days    Violeta Gelinas, MD, MPH, FACS Trauma & General Surgery Use [email or web address] to contact on call provider  10/14/2020 Patient ID: Heather [name], female   DOB: [birth year], 75 y.o.   MRN: [id number]
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
## 2020-10-14 NOTE — Progress Notes (Signed)
Referring Physician(s): Barnetta Chapel, PA-C  Supervising Physician: Irish Lack  Patient Status:  [facility] - In-pt  Chief Complaint:  Acute cholecystitis  Brief History:  Heather [name] is a 75 y.o. female who presented to [place] [facility] ED on 10/09/2020 due to a fall.  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  She was scheduled for surgery for the left femur fracture, however she developed right upper abdominal pain on 10/11/20 and had a fever T-max 101.7.  US done on 10/12/2020 showed cholelithiasis with gallbladder wall thickening suggestive of acute cholecystitis.   She was evaluated by general surgery for further evaluation management who recommended percutaneous cholecystotomy placement.   She underwent image guided percutaneous cholecystectomy tube placement by Dr. Fredia Sorrow on 10/12/20.  Subjective:  Lying in bed, doing well, no complaints.  Allergies: Augmentin [amoxicillin-pot clavulanate]  Medications: Prior to Admission medications   Medication Sig Start Date End Date Taking? Authorizing Provider  albuterol (VENTOLIN HFA) 108 (90 Base) MCG/ACT inhaler Inhale 2 puffs into the lungs every 6 (six) hours as needed for wheezing or shortness of breath. 07/12/19  Yes [provider]  buPROPion (WELLBUTRIN XL) 300 MG 24 hr tablet Take 300 mg by mouth daily.   Yes [provider]  celecoxib (CELEBREX) 200 MG capsule Take 200 mg by mouth 2 (two) times daily.   Yes [provider]  dicyclomine (BENTYL) 20 MG tablet Take 20 mg by mouth every 6 (six) hours as needed for spasms. 02/08/20  Yes [provider]  fluticasone (FLONASE) 50 MCG/ACT nasal spray Place 1 spray into both nostrils daily.   Yes [provider]  gabapentin (NEURONTIN) 300 MG capsule Take  300-600 mg by mouth 2 (two) times daily. Take 1 capsule (300 mg) in the morning & Take 2 capsules (600 mg) at bedtime   Yes [provider]  HYDROcodone-acetaminophen (NORCO/VICODIN) 5-325 MG tablet Take 1-2 tablets by mouth every 4 (four) hours as needed for severe pain ((score 7 to 10)). 05/24/20  Yes Maeola Harman, MD  methocarbamol (ROBAXIN) 500 MG tablet Take 500 mg by mouth every 12 (twelve) hours as needed for muscle spasms.   Yes [provider]  montelukast (SINGULAIR) 10 MG tablet Take 10 mg by mouth at bedtime.   Yes [provider]  omeprazole (PRILOSEC) 40 MG capsule Take 1 capsule (40 mg total) by mouth daily. 12/29/19  Yes Nyoka Cowden, MD  ondansetron (ZOFRAN-ODT) 4 MG disintegrating tablet Take 4 mg by mouth every 8 (eight) hours as needed for nausea or vomiting. 10/08/20  Yes [provider]  rosuvastatin (CRESTOR) 10 MG tablet Take 10 mg by mouth daily.   Yes [provider]  tacrolimus (PROTOPIC) 0.03 % ointment Apply 1 application topically 2 (two) times daily. 02/25/20  Yes [provider]  traZODone (DESYREL) 150 MG tablet Take 150 mg by mouth at bedtime. 09/19/20  Yes [provider]  valsartan (DIOVAN) 80 MG tablet Take 1 tablet (80 mg total) by mouth daily. 12/29/19  Yes Nyoka Cowden, MD     Vital Signs: BP 140/76 (BP Location: Right Arm)   Pulse 85   Temp 99 F (37.2 C) (Oral)   Resp 18   Ht 5\' 6"  (1.676 m)   Wt 58.2 kg   SpO2 95%   BMI 20.71 kg/m  Physical Exam Vitals reviewed.  Constitutional:      Appearance: Normal appearance.  Cardiovascular:     Rate and Rhythm: Normal rate.  Pulmonary:     Effort: Pulmonary effort is normal. No respiratory distress.  Abdominal:     Palpations: Abdomen is soft.     Comments: RUQ perc chole drain in place. Copious bilious drainage in bag = ~ 250 mL. ~420 mL output recorded overnight.  Neurological:     General: No focal deficit present.     Mental  Status: She is alert and oriented to person, place, and time.  Psychiatric:        Mood and Affect: Mood normal.        Behavior: Behavior normal.        Thought Content: Thought content normal.        Judgment: Judgment normal.    Imaging: CARDIAC CATHETERIZATION  Result Date: 10/11/2020   Prox LAD lesion is 30% stenosed.   Prox RCA to Mid RCA lesion is 20% stenosed.   LV end diastolic pressure is normal. Minimal nonobstructive CAD Normal LVEDP Plan: medical management.   IR Perc Cholecystostomy  Result Date: 10/12/2020 INDICATION: 75 year old female with acute calculus cholecystitis, poor surgical candidate. EXAM: Percutaneous cholecystostomy tube placement MEDICATIONS: 2 g cefoxitin, intravenous; The antibiotic was administered within an appropriate time frame prior to the initiation of the procedure. ANESTHESIA/SEDATION: Moderate (conscious) sedation was employed during this procedure. A total of Versed 2.5 mg and Fentanyl 100 mcg was administered intravenously. Moderate Sedation Time: 11 minutes. The patient's level of consciousness and vital signs were monitored continuously by radiology nursing throughout the procedure under my direct supervision. FLUOROSCOPY TIME:  Fluoroscopy Time: 0 minutes 36 seconds (12.6 mGy). COMPLICATIONS: None immediate. PROCEDURE: Informed written consent was obtained from the patient after a thorough discussion of the procedural risks, benefits and alternatives. All questions were addressed. Maximal Sterile Barrier Technique was utilized including caps, mask, sterile gowns, sterile gloves, sterile drape, hand hygiene and skin antiseptic. A timeout was performed prior to the initiation of the procedure. The patient was placed supine on the angiographic table. The patient's right upper quadrant was then prepped and draped in normal sterile fashion with maximum sterile barrier. Ultrasound demonstrates a distended gallbladder with echogenic gallstones in the fundus.  Subdermal Local anesthesia was provided at the planned skin entry site. Under ultrasound guidance, deeper local anesthetic was provided through anterior abdominal muscles. Ultrasound was used to puncture the gallbladder using a 21 gauge Chiba needle via a subhepatic approach with visualization of the lung treated to the gallbladder. A 0.018 inch wire was advanced into the lumen and a transition dilator placed. A gentle hand injection of contrast was performed. Cholecystogram demonstrates irregular filling defects consistent with gallstones. The cystic duct is patent. A 0.035 inch exchange wire was placed in the tract was dilated. A 10.2 French multipurpose drainage catheter was advanced into the gallbladder lumen. The drain was then secured in place using a 0-silk suture and a Stayfix device. A sterile dressing was applied. The tube was placed to bag drainage. The patient tolerated procedure well without evidence of immediate complication was transferred back to the floor in stable condition. IMPRESSION: Successful placement of a 10.2 French percutaneous, subhepatic/transperitoneal cholecystostomy tube. Marliss Coots, MD [facility] Radiology Electronically Signed   By: Marliss Coots M.D.   On: 10/12/2020 15:21   DG Chest Port 1 View  Result Date: 10/12/2020 CLINICAL DATA:  Sepsis. EXAM: PORTABLE  CHEST 1 VIEW COMPARISON:  Chest radiograph dated 10/09/2020. FINDINGS: There is cardiomegaly with vascular congestion. No focal consolidation, pleural effusion or pneumothorax. Degenerative changes of the spine. No acute osseous pathology. Partially visualized right shoulder arthroplasty. IMPRESSION: Cardiomegaly with vascular congestion. No focal consolidation. Electronically Signed   By: Elgie Collard M.D.   On: 10/12/2020 01:33   DG C-Arm 1-60 Min-No Report  Result Date: 10/13/2020 Fluoroscopy was utilized by the requesting physician.  No radiographic  interpretation.   ECHOCARDIOGRAM COMPLETE  Result Date: 10/10/2020    ECHOCARDIOGRAM REPORT   Patient Name:   DARRIN APODACA Date of Exam: 10/10/2020 Medical Rec #:  [id number]              Height:       63.0 in Accession #:    [id number]             Weight:       188.5 lb Date of Birth:  1945/08/21             BSA:          1.886 m Patient Age:    74 years               BP:           140/71 mmHg Patient Gender: F                      HR:           93 bpm. Exam Location:  Inpatient Procedure: 2D Echo Indications:    elevated troponin  History:        Patient has no prior history of Echocardiogram examinations.                 Signs/Symptoms:elevated troponin; Risk Factors:Dyslipidemia and                 Hypertension.  Sonographer:    Delcie Roch RDCS Referring Phys: [id number] ANASTASSIA DOUTOVA IMPRESSIONS  1. Mild mid-apical anterolateral and posterolateral hypokinesis. Left ventricular ejection fraction, by estimation, is 50 to 55%. The left ventricle has low normal function. The left ventricle demonstrates regional wall motion abnormalities (see scoring  diagram/findings for description). There is mild concentric left ventricular hypertrophy. Left ventricular diastolic parameters are consistent with Grade II diastolic dysfunction (pseudonormalization). Elevated left ventricular end-diastolic pressure.  2. Right ventricular systolic function is normal. The right ventricular size is normal. There is mildly elevated pulmonary artery systolic pressure.  3. Left atrial size was mildly dilated.  4. The mitral valve is normal in structure. No evidence of mitral valve regurgitation. No evidence of mitral stenosis.  5. The aortic valve is tricuspid. Aortic valve regurgitation is not visualized. No aortic stenosis is present.  6. The inferior vena cava is normal in size with greater than 50% respiratory variability, suggesting right atrial pressure of 3 mmHg. FINDINGS  Left Ventricle: Mild mid-apical  anterolateral and posterolateral hypokinesis. Left ventricular ejection fraction, by estimation, is 50 to 55%. The left ventricle has low normal function. The left ventricle demonstrates regional wall motion abnormalities. The left ventricular internal cavity size was normal in size. There is mild concentric left ventricular hypertrophy. Left ventricular diastolic parameters are consistent with Grade II diastolic dysfunction (pseudonormalization). Elevated left ventricular end-diastolic pressure. Right Ventricle: The right ventricular size is normal. No increase in right ventricular wall thickness. Right ventricular systolic function is normal. There is mildly elevated pulmonary artery systolic pressure. The tricuspid regurgitant  velocity is 3.19  m/s, and with an assumed right atrial pressure of 3 mmHg, the estimated right ventricular systolic pressure is 43.7 mmHg. Left Atrium: Left atrial size was mildly dilated. Right Atrium: Right atrial size was normal in size. Pericardium: There is no evidence of pericardial effusion. Mitral Valve: The mitral valve is normal in structure. No evidence of mitral valve regurgitation. No evidence of mitral valve stenosis. Tricuspid Valve: The tricuspid valve is normal in structure. Tricuspid valve regurgitation is trivial. No evidence of tricuspid stenosis. Aortic Valve: The aortic valve is tricuspid. Aortic valve regurgitation is not visualized. No aortic stenosis is present. Pulmonic Valve: The pulmonic valve was normal in structure. Pulmonic valve regurgitation is mild. No evidence of pulmonic stenosis. Aorta: The aortic root is normal in size and structure. Venous: The inferior vena cava is normal in size with greater than 50% respiratory variability, suggesting right atrial pressure of 3 mmHg. IAS/Shunts: No atrial level shunt detected by color flow Doppler.  LEFT VENTRICLE PLAX 2D LVIDd:         4.10 cm     Diastology LVIDs:         2.40 cm     LV e' medial:    6.53 cm/s LV  PW:         1.20 cm     LV E/e' medial:  14.1 LV IVS:        1.06 cm     LV e' lateral:   9.36 cm/s LVOT diam:     1.80 cm     LV E/e' lateral: 9.8 LV SV:         43 LV SV Index:   23 LVOT Area:     2.54 cm  LV Volumes (MOD) LV vol d, MOD A4C: 71.5 ml LV vol s, MOD A4C: 35.7 ml LV SV MOD A4C:     71.5 ml RIGHT VENTRICLE             IVC RV S prime:     11.20 cm/s  IVC diam: 1.70 cm TAPSE (M-mode): 2.0 cm LEFT ATRIUM             Index       RIGHT ATRIUM           Index LA diam:        4.00 cm 2.12 cm/m  RA Area:     11.90 cm LA Vol (A2C):   75.3 ml 39.94 ml/m RA Volume:   27.30 ml  14.48 ml/m LA Vol (A4C):   45.0 ml 23.87 ml/m LA Biplane Vol: 59.3 ml 31.45 ml/m  AORTIC VALVE LVOT Vmax:   108.00 cm/s LVOT Vmean:  69.500 cm/s LVOT VTI:    0.170 m  AORTA Ao Root diam: 2.80 cm Ao Asc diam:  3.10 cm MITRAL VALVE               TRICUSPID VALVE MV Area (PHT): 4.80 cm    TR Peak grad:   40.7 mmHg MV Decel Time: 158 msec    TR Vmax:        319.00 cm/s MV E velocity: 92.10 cm/s MV A velocity: 84.80 cm/s  SHUNTS MV E/A ratio:  1.09        Systemic VTI:  0.17 m                            Systemic Diam: 1.80 cm Chilton Si MD Electronically signed by  Chilton Si MD Signature Date/Time: 10/10/2020/6:46:32 PM    Final    DG FEMUR MIN 2 VIEWS LEFT  Result Date: 10/13/2020 CLINICAL DATA:  Elective surgery. EXAM: LEFT FEMUR 2 VIEWS COMPARISON:  Preoperative imaging. FINDINGS: Six fluoroscopic spot views of the left femur obtained in the operating room. Lateral plate and multi screw fixation of distal femur fracture. 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 Total fluoroscopy time 1 minutes 5 seconds. Dose 5.98 mGy. IMPRESSION: Intraoperative fluoroscopy for distal femur fracture ORIF. Electronically Signed   By: Narda Rutherford M.D.   On: 10/13/2020 12:39   DG FEMUR PORT MIN 2 VIEWS LEFT  Result Date: 10/13/2020 CLINICAL DATA:  Postop left femur fracture repair. EXAM: LEFT FEMUR PORTABLE 2 VIEWS COMPARISON:   Preoperative imaging. FINDINGS: Lateral plate and multi screw fixation of comminuted distal femur fracture. Fracture is in improved alignment from preoperative imaging. 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 Recent postsurgical change includes air and edema in the soft tissues. IMPRESSION: ORIF of comminuted distal femur fracture, in improved alignment from preoperative imaging. Electronically Signed   By: Narda Rutherford M.D.   On: 10/13/2020 12:40   US Abdomen Limited RUQ (LIVER/GB)  Result Date: 10/12/2020 CLINICAL DATA:  Right upper quadrant abdominal pain EXAM: ULTRASOUND ABDOMEN LIMITED RIGHT UPPER QUADRANT COMPARISON:  None. FINDINGS: Gallbladder: Multiple large small bowel shadowing stones are present. The largest measures up to 2.3 mm. Gallbladder wall is thickened, measuring up to 4.8 mm. No sonographic Eulah Pont sign is reported. Common bile duct: Diameter: 4.0 mm, within normal limits Liver: No focal lesion identified. Within normal limits in parenchymal echogenicity. Portal vein is patent on color Doppler imaging with normal direction of blood flow towards the liver. Other: None. IMPRESSION: 1. Cholelithiasis with gallbladder wall thickening suggesting acute cholecystitis. 2. No sonographic Eulah Pont sign is reported. Electronically Signed   By: Marin Roberts M.D.   On: 10/12/2020 07:45    Labs:  CBC: Recent Labs    10/11/20 0037 10/12/20 0110 10/13/20 0248 10/14/20 0213  WBC 12.4* 10.6* 10.9* 9.2  HGB 9.9* 9.3* 8.4* 8.3*  HCT 30.2* 29.1* 25.8* 25.5*  PLT 119* 115* 127* 146*    COAGS: Recent Labs    10/09/20 2317 10/12/20 0110  INR 1.5* 1.3*  APTT  --  25    BMP: Recent Labs    10/10/20 0540 10/11/20 0037 10/12/20 0110 10/14/20 0213  NA 139 138 138 140  K 4.0 3.7 4.1 4.2  CL 115* 114* 109 111  CO2 18* 19* 22 22  GLUCOSE 84 100* 166* 154*  BUN CALCIUM 6.6* 6.6* 7.9* 7.7*  CREATININE 1.10* 0.86 0.81 0.76  GFRNONAA 53* >60 >60 >60    LIVER  FUNCTION TESTS: Recent Labs    10/09/20 1840 10/10/20 0540 10/11/20 0037 10/12/20 0110 10/14/20 0213  BILITOT 1.3* 0.6  --  0.8 0.4  AST 30 15  --  22 15  ALT 13 12  --  15 12  ALKPHOS 54 44  --  59 45  PROT 5.7* 4.7*  --  5.5* 4.9*  ALBUMIN 3.0* 2.5* 2.2* 3.0* 2.4*    Assessment and Plan:  Acute cholecystitis = s/p perc chole by Dr. Fredia Sorrow on 10/12/20.  Flushes easily.   Percutaneous cholecystostomy drain to remain in place at least 6 weeks.   Recommend fluoroscopy with injection of the drain in IR to evaluate for patency of the cystic duct.  If the duct is patent and general surgery  feels patient is stable for cholecystectomy, the drain would be removed at time of surgery.  If the duct is patent and general surgery feels patient is NEVER a candidate for cholecystectomy, drain can be capped for a trial.  If symptoms recur, then place to gravity bag again.  If trial is successful, discuss possible removal of the drain.  Please call the IR PA at [phone] when patient is about to be discharged and we will arrange the follow up drain injection (ok to leave message).    Electronically Signed: Gwynneth Macleod, PA-C 10/14/2020, 3:43 PM    I spent a total of 15 Minutes at the the patient's bedside AND on the patient's hospital floor or unit, greater than 50% of which was counseling/coordinating care for f/u perc chole.

## 2020-10-15 DIAGNOSIS — S72402A Unspecified fracture of lower end of left femur, initial encounter for closed fracture: Secondary | ICD-10-CM | POA: Diagnosis not present

## 2020-10-15 LAB — CULTURE, BLOOD (ROUTINE X 2): Culture: NO GROWTH

## 2020-10-15 LAB — COMPREHENSIVE METABOLIC PANEL
ALT: 10 U/L (ref 0–44)
AST: 14 U/L — ABNORMAL LOW (ref 15–41)
Albumin: 2.3 g/dL — ABNORMAL LOW (ref 3.5–5.0)
Alkaline Phosphatase: 47 U/L (ref 38–126)
Anion gap: 7 (ref 5–15)
BUN: 12 mg/dL (ref 8–23)
CO2: 24 mmol/L (ref 22–32)
Calcium: 7.9 mg/dL — ABNORMAL LOW (ref 8.9–10.3)
Chloride: 108 mmol/L (ref 98–111)
Creatinine, Ser: 0.72 mg/dL (ref 0.44–1.00)
GFR, Estimated: >60 mL/min (ref >60)
Glucose, Bld: 79 mg/dL (ref 70–99)
Potassium: 3.4 mmol/L — ABNORMAL LOW (ref 3.5–5.1)
Sodium: 139 mmol/L (ref 135–145)
Total Bilirubin: 1 mg/dL (ref 0.3–1.2)
Total Protein: 4.9 g/dL — ABNORMAL LOW (ref 6.5–8.1)

## 2020-10-15 LAB — MAGNESIUM: Magnesium: 1.8 mg/dL (ref 1.7–2.4)

## 2020-10-15 LAB — PHOSPHORUS: Phosphorus: 1.8 mg/dL — ABNORMAL LOW (ref 2.5–4.6)

## 2020-10-15 MED ORDER — METRONIDAZOLE 500 MG PO TABS
500.0000 mg | ORAL_TABLET | Freq: Three times a day (TID) | ORAL | Status: DC
  Administered 2020-10-15 – 2020-10-18 (×10): 500 mg via ORAL
  Filled 2020-10-15 (×10): qty 1

## 2020-10-15 MED ORDER — LOPERAMIDE HCL 2 MG PO CAPS
2.0000 mg | ORAL_CAPSULE | ORAL | Status: DC | PRN
  Administered 2020-10-15 – 2020-10-18 (×5): 2 mg via ORAL
  Filled 2020-10-15 (×5): qty 1

## 2020-10-15 MED ORDER — CEPHALEXIN 500 MG PO CAPS
500.0000 mg | ORAL_CAPSULE | Freq: Two times a day (BID) | ORAL | Status: DC
  Administered 2020-10-15 – 2020-10-18 (×6): 500 mg via ORAL
  Filled 2020-10-15 (×6): qty 1

## 2020-10-15 MED ORDER — POTASSIUM CHLORIDE CRYS ER 20 MEQ PO TBCR
40.0000 meq | EXTENDED_RELEASE_TABLET | Freq: Once | ORAL | Status: AC
  Administered 2020-10-15: 40 meq via ORAL
  Filled 2020-10-15: qty 2

## 2020-10-15 NOTE — Progress Notes (Signed)
Orthopaedic Trauma Progress Note  SUBJECTIVE: Doing okay this morning.  Pain slightly better controlled with addition of oxycodone. No chest pain. No SOB. No nausea/vomiting. No other complaints.   OBJECTIVE:  Vitals:   10/15/20 0502 10/15/20 0914  BP: 133/67 (!) 156/72  Pulse:  84  Resp:    Temp: 99.4 F (37.4 C)   SpO2: 93%     General: Sitting up in bed, no acute distress.  Pleasant and cooperative Respiratory: No increased work of breathing.  Left lower extremity: Dressing removed, incisions clean, dry, intact.  Tenderness with palpation of the thigh as expected.  Ankle dorsiflexion/plantarflexion is intact.  Endorses sensation throughout extremity with exception of decreased sensation through the midfoot.  Patient notes this is close to her baseline. +DP pulse  IMAGING: Stable post op imaging.   LABS:  Results for orders placed or performed during the hospital encounter of 10/09/20 (from the past 24 hour(s))  Comprehensive metabolic panel     Status: Abnormal   Collection Time: 10/15/20  3:36 AM  Result Value Ref Range   Sodium 139 135 - 145 mmol/L   Potassium 3.4 (L) 3.5 - 5.1 mmol/L   Chloride 108 98 - 111 mmol/L   CO2 24 22 - 32 mmol/L   Glucose, Bld 79 70 - 99 mg/dL   BUN 12 8 - 23 mg/dL   Creatinine, Ser 8.29 0.44 - 1.00 mg/dL   Calcium 7.9 (L) 8.9 - 10.3 mg/dL   Total Protein 4.9 (L) 6.5 - 8.1 g/dL   Albumin 2.3 (L) 3.5 - 5.0 g/dL   AST 14 (L) 15 - 41 U/L   ALT 10 0 - 44 U/L   Alkaline Phosphatase 47 38 - 126 U/L   Total Bilirubin 1.0 0.3 - 1.2 mg/dL   GFR, Estimated >56 >21 mL/min   Anion gap 7 5 - 15  Magnesium     Status: None   Collection Time: 10/15/20  3:36 AM  Result Value Ref Range   Magnesium 1.8 1.7 - 2.4 mg/dL  Phosphorus     Status: Abnormal   Collection Time: 10/15/20  3:36 AM  Result Value Ref Range   Phosphorus 1.8 (L) 2.5 - 4.6 mg/dL    ASSESSMENT: Heather [name] is a 75 y.o. female, 2 Days Post-Op s/p ORIF LEFT DISTAL FEMUR  FRACTURE  CV/Blood loss: Hgb 8.3 yesterday morning, stable form pre-op.  CBC pending this AM.  Patient hypertensive but otherwise hemodynamically stable  PLAN: Weightbearing: PWB 50%  LLE ROM: Okay for unrestricted hip and knee motion as tolerated Incisional and dressing care: Okay to leave incisions open to air Showering: Okay to begin showering with assistance 10/16/2020 if no drainage from incisions Orthopedic device(s): None  Pain management:  1. Tylenol 325-650 mg q 6 hours PRN 2. Robaxin 500 mg q 6 hours PRN 3. Oxycodone 5 mg q 4 hours PRN severe pain 4. Norco 5-325 every 4 hours PRN moderate pain  5. Neurontin 300 mg QD + 600 mg QHS 6. Morphine 0.5-1 mg q 2 hours PRN VTE prophylaxis: Lovenox, SCDs ID: [id number] 2gm post op completed Foley/Lines: No Foley.  KVO IVFs Impediments to Fracture Healing: Vit D level low normal range at 31, no supplementation needed Dispo: Therapies as tolerated, PT/OT recommending CIR.  Continue to work on pain control.  Patient okay for discharge from orthopedic standpoint once mobilizing well with therapies and cleared from medicine team.  Follow - up plan: Will continue to follow while in hospital and  plan for outpatient follow-up 2 weeks after d/c  Contact information:  Truitt Merle MD, Ulyses Southward PA-C. After hours and holidays please check [email or web address] for group call information for [facility]   Heather Pangle A. Michaelyn Barter, PA-C [phone] (office) [email or web address]

## 2020-10-15 NOTE — Progress Notes (Signed)
Inpatient Rehab Admissions:  Inpatient Rehab Consult received.  I met with patient at the bedside for rehabilitation assessment and to discuss goals and expectations of an inpatient rehab admission.  Pt acknowledged understanding of CIR goals and expectations. Pt interested in pursuing CIR.  Pt gave permission to contact, daughter, [name]. Spoke with [name] on the telephone. She acknowledged understanding of CIR goals and expectations. She is interested in pt pursuing CIR.  Will continue to follow.  Signed: Gayland Curry, [place], [place] Admissions Coordinator [phone]

## 2020-10-15 NOTE — Plan of Care (Signed)
  Problem: Health Behavior/Discharge Planning: Goal: Ability to manage health-related needs will improve Outcome: Progressing   Problem: Clinical Measurements: Goal: Ability to maintain clinical measurements within normal limits will improve Outcome: Progressing   Problem: Clinical Measurements: Goal: Diagnostic test results will improve Outcome: Progressing   Problem: Clinical Measurements: Goal: Respiratory complications will improve Outcome: Progressing   Problem: Clinical Measurements: Goal: Cardiovascular complication will be avoided Outcome: Progressing   Problem: Activity: Goal: Risk for activity intolerance will decrease Outcome: Progressing   Problem: Nutrition: Goal: Adequate nutrition will be maintained Outcome: Progressing   Problem: Pain Managment: Goal: General experience of comfort will improve Outcome: Progressing   Problem: Safety: Goal: Ability to remain free from injury will improve Outcome: Progressing   Problem: Skin Integrity: Goal: Risk for impaired skin integrity will decrease Outcome: Progressing
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
## 2020-10-15 NOTE — Progress Notes (Addendum)
PROGRESS NOTE    Heather [name]  HDQ:[id number] DOB: 1945/03/21 DOA: 10/09/2020 PCP: Janece Canterbury, MD   Chief Complaint  Patient presents with   Fall   Brief Narrative:  Heather [name] is Heather [name] 75 y.o. female with medical history significant for depression, hyperlipidemia, hypertension, asthma, L3 burst fracture s/p repair, who was brought to the hospital after Heather [name] fall at home.   She was diagnosed with Heather [name] distal femoral fracture.  She was also found to have AKI, hyperkalemia, elevated troponins concerning for NSTEMI.  She was treated with IV fluids and analgesics.  She underwent left heart cath which showed minimal nonobstructive CAD.    She developed fever and tachycardia overnight 8/23-24.  She was started on broad spectrum abx and treated for sepsis.  RUQ US showed cholecystitis.  Surgery was c/s and she's now s/p perc chole drain.   Assessment & Plan:   Active Problems:   Essential hypertension   Hypotension   Femur fracture (HCC)   Prolonged QT interval   Abnormal ECG   AKI (acute kidney injury) (HCC)   Hyperkalemia   Elevated troponin   Leukocytosis   Malnutrition of moderate degree  Suspect she'll need rehab, either SNF vs CIR - > maybe good CIR candidate? Awaiting evaluation   Sepsis 2/2 Acute Cholecystitis Fever, tachycardia, tachypnea - meets criteria for sepsis RUQ [place] with acute cholecystitis CXR with cardiomegaly with vascular congestion She's now s/p perc chole drain by IR 8/24 Appreciate surgery recs - defer lap chole at this time, perc chole by IR -> planning for drain placement with interval lap chole in 6-8 weeks.  Continue abx -> transition to PO for additional 7 days (keflex/flagyl (discussed with pharm) last day 9/2). Appreciate IR recs as well (8/26 note) Repeat blood cultures no growth  UA on admission not concerning for UTI.  1/2 sets of blood cultures from admission with coag negative staph, suspect contaminant.  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  Acute Kidney Injury Creatinine 2.21 at presentation  Improving - follow metabolic panel tomorrow  Hypocalcemia  Hypophosphatemia Follow - no metabolic panel today Follow tomorrow  Prolonged Qtc Repeat EKG - wnl 8/25  Thrombocytopenia Continue to monitor - fluctuating, improved today  DVT prophylaxis: (heparin Code Status: (full  Family Communication: none at bedside Disposition:   Status is: Inpatient  Remains inpatient appropriate because:Inpatient level of care appropriate due to severity of illness  Dispo: The patient is from: Home              Anticipated d/c is to: Home              Patient currently is not medically stable to d/c.   Difficult to place patient No       Consultants:  Cardiology IR General surgery orthopedics  Procedures:  ORIF periprosthetic distal femur fx 8/25 8/24   IMPRESSION: Successful placement of Heather [name] 10.2 French percutaneous, subhepatic/transperitoneal cholecystostomy tube.  8/23   Prox LAD lesion is 30% stenosed.   Prox RCA to  Mid RCA lesion is 20% stenosed.   LV end diastolic pressure is normal.   Minimal nonobstructive CAD Normal LVEDP   Plan: medical management.  Echo IMPRESSIONS     1. Mild mid-apical anterolateral and posterolateral hypokinesis. Left  ventricular ejection fraction, by estimation, is 50 to 55%. The left  ventricle has low normal function. The left ventricle  demonstrates  regional wall motion abnormalities (see scoring   diagram/findings for description). There is mild concentric left  ventricular hypertrophy. Left ventricular diastolic parameters are  consistent with Grade II diastolic dysfunction (pseudonormalization).  Elevated left ventricular end-diastolic pressure.   2. Right ventricular systolic function is normal. The right ventricular  size is normal. There is mildly elevated pulmonary artery systolic  pressure.   3. Left atrial size was mildly dilated.   4. The mitral valve is normal in structure. No evidence of mitral valve  regurgitation. No evidence of mitral stenosis.   5. The aortic valve is tricuspid. Aortic valve regurgitation is not  visualized. No aortic stenosis is present.   6. The inferior vena cava is normal in size with greater than 50%  respiratory variability, suggesting right atrial pressure of 3 mmHg.   Antimicrobials:  Anti-infectives (From admission, onward)    Start     Dose/Rate Route Frequency Ordered Stop   10/15/20 2200  cephALEXin (KEFLEX) capsule 500 mg        500 mg Oral Every 12 hours 10/15/20 1057     10/15/20 1400  metroNIDAZOLE (FLAGYL) tablet 500 mg        500 mg Oral Every 8 hours 10/15/20 1057     10/13/20 1004  vancomycin (VANCOCIN) powder  Status:  Discontinued          As needed 10/13/20 1005 10/13/20 1055   10/13/20 0400  vancomycin (VANCOCIN) IVPB 1000 mg/200 mL premix  Status:  Discontinued        1,000 mg 200 mL/hr over 60 Minutes Intravenous Every 24 hours 10/12/20 0241 10/12/20 0810   10/12/20 1600  ceFEPIme (MAXIPIME) 2 g in sodium chloride 0.9 % 100 mL IVPB  Status:  Discontinued        2 g 200 mL/hr over 30 Minutes Intravenous Every 12 hours 10/12/20 0241 10/15/20 1057   10/12/20 1330  cefOXitin (MEFOXIN) 2 g in sodium chloride 0.9 % 100 mL IVPB        2 g 200 mL/hr over 30 Minutes Intravenous To Radiology 10/12/20 1241 10/13/20 1330   10/12/20 0330  metroNIDAZOLE (FLAGYL) IVPB  500 mg  Status:  Discontinued        500 mg 100 mL/hr over 60 Minutes Intravenous Every 8 hours 10/12/20 0235 10/15/20 1057   10/12/20 0330  vancomycin (VANCOCIN) IVPB 1000 mg/200 mL premix        1,000 mg 200 mL/hr over 60 Minutes Intravenous  Once 10/12/20 0237 10/12/20 1149   10/12/20 0330  ceFEPIme (MAXIPIME) 2 g in sodium chloride 0.9 % 100 mL IVPB        2 g 200 mL/hr over 30 Minutes Intravenous  Once 10/12/20 0237 10/12/20 1148   10/12/20 0100  vancomycin (VANCOREADY) IVPB 750 mg/150 mL  Status:  Discontinued        750 mg 150 mL/hr over 60 Minutes Intravenous Every 48 hours 10/09/20 2343 10/10/20 0958   10/10/20 0000  vancomycin (VANCOREADY) IVPB 1750 mg/350 mL        1,750 mg 175 mL/hr over 120 Minutes Intravenous  Once 10/09/20 2302  10/10/20 0308   10/09/20 2315  ceFEPIme (MAXIPIME) 2 g in sodium chloride 0.9 % 100 mL IVPB        2 g 200 mL/hr over 30 Minutes Intravenous  Once 10/09/20 2302 10/10/20 0026   10/09/20 2300  metroNIDAZOLE (FLAGYL) IVPB 500 mg  Status:  Discontinued        500 mg 100 mL/hr over 60 Minutes Intravenous Every 12 hours 10/09/20 2251 10/10/20 0958   10/09/20 2200  ceFEPIme (MAXIPIME) 2 g in sodium chloride 0.9 % 100 mL IVPB  Status:  Discontinued        2 g 200 mL/hr over 30 Minutes Intravenous Every 24 hours 10/09/20 2343 10/10/20 0958          Subjective: No new complaints, pain well controlled  Objective: Vitals:   10/14/20 1931 10/15/20 0502 10/15/20 0914 10/15/20 1345  BP: (!) 147/80 133/67 (!) 156/72 (!) 145/82  Pulse:   84 80  Resp:    20  Temp: 98.3 F (36.8 C) 99.4 F (37.4 C)  99.2 F (37.3 C)  TempSrc: Oral Oral  Oral  SpO2:  93%  92%  Weight:      Height:        Intake/Output Summary (Last 24 hours) at 10/15/2020 1443 Last data filed at 10/15/2020 1414 Gross per 24 hour  Intake 1313.71 ml  Output 3275 ml  Net -1961.29 ml   Filed Weights   10/10/20 2000  Weight: 58.2 kg    Examination:  General: No acute  distress. Cardiovascular: RRR. Lungs: unlabored Abdomen: Soft, nontender, nondistended Neurological: Alert and oriented 3. Moves all extremities 4. Cranial nerves II through XII grossly intact. Skin: Warm and dry. No rashes or lesions. Extremities: incisions well appearing, no drainage, no signs of infection      Data Reviewed: I have personally reviewed following labs and imaging studies  CBC: Recent Labs  Lab 10/09/20 1840 10/10/20 0639 10/11/20 0037 10/12/20 0110 10/13/20 0248 10/14/20 0213  WBC 18.6* 13.1* 12.4* 10.6* 10.9* 9.2  NEUTROABS 13.7* 10.3*  --  8.4*  --   --   HGB 13.1 9.4* 9.9* 9.3* 8.4* 8.3*  HCT 42.4 30.2* 30.2* 29.1* 25.8* 25.5*  MCV 87.2 86.8 83.9 83.9 83.0 84.2  PLT 149* 116* 119* 115* 127* 146*    Basic Metabolic Panel: Recent Labs  Lab 10/10/20 0000 10/10/20 0540 10/11/20 0037 10/12/20 0110 10/14/20 0213 10/15/20 0336  NA 141 139 138 138 140 139  K 4.1 4.0 3.7 4.1 4.2 3.4*  CL 116* 115* 114* 109 111 108  CO2 19* 18* 19* GLUCOSE 73 84 100* 166* 154* 79  BUN 24* CREATININE 1.40* 1.10* 0.86 0.81 0.76 0.72  CALCIUM 6.1* 6.6* 6.6* 7.9* 7.7* 7.9*  MG 1.7  --   --  1.9  --  1.8  PHOS 2.5  --  1.4* 2.1*  --  1.8*    GFR: Estimated Creatinine Clearance: 57.5 mL/min (by C-G formula based on SCr of 0.72 mg/dL).  Liver Function Tests: Recent Labs  Lab 10/09/20 1840 10/10/20 0540 10/11/20 0037 10/12/20 0110 10/14/20 0213 10/15/20 0336  AST 30 15  --  22 15 14*  ALT 13 12  --  ALKPHOS 54 44  --  59 45 47  BILITOT 1.3* 0.6  --  0.8 0.4 1.0  PROT 5.7* 4.7*  --  5.5* 4.9* 4.9*  ALBUMIN 3.0* 2.5* 2.2* 3.0* 2.4*  2.3*    CBG: No results for input(s): GLUCAP in the last 168 hours.   Recent Results (from the past 240 hour(s))  Resp Panel by RT-PCR (Flu Malique Driskill&B, Covid) Nasopharyngeal Swab     Status: None   Collection Time: 10/09/20  6:41 PM   Specimen: Nasopharyngeal Swab; Nasopharyngeal(NP) swabs in  vial transport medium  Result Value Ref Range Status   SARS Coronavirus 2 by RT PCR NEGATIVE NEGATIVE Final    Comment: (NOTE) SARS-CoV-2 target nucleic acids are NOT DETECTED.  The SARS-CoV-2 RNA is generally detectable in upper respiratory specimens during the acute phase of infection. The lowest concentration of SARS-CoV-2 viral copies this assay can detect is 138 copies/mL. Coyt Govoni negative result does not preclude SARS-Cov-2 infection and should not be used as the sole basis for treatment or other patient management decisions. Nohemy Koop negative result may occur with  improper specimen collection/handling, submission of specimen other than nasopharyngeal swab, presence of viral mutation(s) within the areas targeted by this assay, and inadequate number of viral copies(<138 copies/mL). Tiyonna Sardinha negative result must be combined with clinical observations, patient history, and epidemiological information. The expected result is Negative.  Fact Sheet for Patients:  BloggerCourse.com  Fact Sheet for Healthcare Providers:  SeriousBroker.it  This test is no t yet approved or cleared by the [place] FDA and  has been authorized for detection and/or diagnosis of SARS-CoV-2 by FDA under an Emergency Use Authorization (EUA). This EUA will remain  in effect (meaning this test can be used) for the duration of the COVID-19 declaration under Section 564(b)(1) of the Act, 21 U.S.C.section 360bbb-3(b)(1), unless the authorization is terminated  or revoked sooner.       Influenza Jsaon Yoo by PCR NEGATIVE NEGATIVE Final   Influenza B by PCR NEGATIVE NEGATIVE Final    Comment: (NOTE) The Xpert Xpress SARS-CoV-2/FLU/RSV plus assay is intended as an aid in the diagnosis of influenza from Nasopharyngeal swab specimens and should not be used as Anand Tejada sole basis for treatment. Nasal washings and aspirates are unacceptable for Xpert Xpress SARS-CoV-2/FLU/RSV testing.  Fact  Sheet for Patients: BloggerCourse.com  Fact Sheet for Healthcare Providers: SeriousBroker.it  This test is not yet approved or cleared by the [place] FDA and has been authorized for detection and/or diagnosis of SARS-CoV-2 by FDA under an Emergency Use Authorization (EUA). This EUA will remain in effect (meaning this test can be used) for the duration of the COVID-19 declaration under Section 564(b)(1) of the Act, 21 U.S.C. section 360bbb-3(b)(1), unless the authorization is terminated or revoked.  Performed at [facility], [address], [place], [place] [address]   Culture, blood (routine x 2)     Status: None   Collection Time: 10/09/20  7:55 PM   Specimen: BLOOD  Result Value Ref Range Status   Specimen Description   Final    BLOOD RIGHT ANTECUBITAL Performed at [facility], [address], [place], [place] [address]    Special Requests   Final    BOTTLES DRAWN AEROBIC AND ANAEROBIC Blood Culture results may not be optimal due to an inadequate volume of blood received in culture bottles Performed at [facility], [address] [place], [place] [address]    Culture   Final    NO GROWTH 5 DAYS Performed at [facility], [address] [place], [place] [address]    Report Status 10/15/2020 FINAL  Final  Culture, blood (routine x 2)  Status: Abnormal   Collection Time: 10/09/20  7:55 PM   Specimen: BLOOD  Result Value Ref Range Status   Specimen Description   Final    BLOOD LEFT ANTECUBITAL Performed at [facility], [address] [place], [place] [address]    Special Requests   Final    BOTTLES DRAWN AEROBIC AND ANAEROBIC Blood Culture results may not be optimal due to an inadequate volume of blood received in culture bottles Performed at [facility], [address], [place], [place] [address]    Culture   Setup Time   Final    GRAM POSITIVE COCCI IN CLUSTERS IN BOTH AEROBIC AND ANAEROBIC BOTTLES CRITICAL RESULT CALLED TO, READ BACK BY AND VERIFIED WITH: L SEAY,PHARMD@2330  10/10/20 MK    Culture (Sharisse Rantz)  Final    STAPHYLOCOCCUS EPIDERMIDIS STAPHYLOCOCCUS HOMINIS THE SIGNIFICANCE OF ISOLATING THIS ORGANISM FROM Willford Rabideau SINGLE SET OF BLOOD CULTURES WHEN MULTIPLE SETS ARE DRAWN IS UNCERTAIN. PLEASE NOTIFY THE MICROBIOLOGY DEPARTMENT WITHIN ONE WEEK IF SPECIATION AND SENSITIVITIES ARE REQUIRED. Performed at [facility], [address], [place], [place] [address]    Report Status 10/13/2020 FINAL  Final  Blood Culture ID Panel (Reflexed)     Status: Abnormal   Collection Time: 10/09/20  7:55 PM  Result Value Ref Range Status   Enterococcus faecalis NOT DETECTED NOT DETECTED Final   Enterococcus Faecium NOT DETECTED NOT DETECTED Final   Listeria monocytogenes NOT DETECTED NOT DETECTED Final   Staphylococcus species DETECTED (Mitcheal Sweetin) NOT DETECTED Final    Comment: CRITICAL RESULT CALLED TO, READ BACK BY AND VERIFIED WITH: L SEAY,PHARMD@2330  10/10/20 MK    Staphylococcus aureus (BCID) NOT DETECTED NOT DETECTED Final   Staphylococcus epidermidis DETECTED (Pamla Pangle) NOT DETECTED Final    Comment: Methicillin (oxacillin) resistant coagulase negative staphylococcus. Possible blood culture contaminant (unless isolated from more than one blood culture draw or clinical case suggests pathogenicity). No antibiotic treatment is indicated for blood  culture contaminants. CRITICAL RESULT CALLED TO, READ BACK BY AND VERIFIED WITH: L SEAY,PHARMD@2330  10/10/20 MK    Staphylococcus lugdunensis NOT DETECTED NOT DETECTED Final   Streptococcus species NOT DETECTED NOT DETECTED Final   Streptococcus agalactiae NOT DETECTED NOT DETECTED Final   Streptococcus pneumoniae NOT DETECTED NOT DETECTED Final   Streptococcus pyogenes NOT DETECTED NOT DETECTED Final   Zhaire Locker.calcoaceticus-baumannii NOT DETECTED NOT DETECTED Final    Bacteroides fragilis NOT DETECTED NOT DETECTED Final   Enterobacterales NOT DETECTED NOT DETECTED Final   Enterobacter cloacae complex NOT DETECTED NOT DETECTED Final   Escherichia coli NOT DETECTED NOT DETECTED Final   Klebsiella aerogenes NOT DETECTED NOT DETECTED Final   Klebsiella oxytoca NOT DETECTED NOT DETECTED Final   Klebsiella pneumoniae NOT DETECTED NOT DETECTED Final   Proteus species NOT DETECTED NOT DETECTED Final   Salmonella species NOT DETECTED NOT DETECTED Final   Serratia marcescens NOT DETECTED NOT DETECTED Final   Haemophilus influenzae NOT DETECTED NOT DETECTED Final   Neisseria meningitidis NOT DETECTED NOT DETECTED Final   Pseudomonas aeruginosa NOT DETECTED NOT DETECTED Final   Stenotrophomonas maltophilia NOT DETECTED NOT DETECTED Final   Candida albicans NOT DETECTED NOT DETECTED Final   Candida auris NOT DETECTED NOT DETECTED Final   Candida glabrata NOT DETECTED NOT DETECTED Final   Candida krusei NOT DETECTED NOT DETECTED Final   Candida parapsilosis NOT DETECTED NOT DETECTED Final   Candida tropicalis NOT DETECTED NOT DETECTED Final   Cryptococcus neoformans/gattii NOT DETECTED NOT DETECTED Final  Methicillin resistance mecA/C DETECTED (Lamya Lausch) NOT DETECTED Final    Comment: CRITICAL RESULT CALLED TO, READ BACK BY AND VERIFIED WITH: L SEAY,PHARMD  10/10/20 MK Performed at [facility], [address] [place], [place] [address]   MRSA Next Gen by PCR, Nasal     Status: None   Collection Time: 10/10/20  4:17 PM   Specimen: Nasal Mucosa; Nasal Swab  Result Value Ref Range Status   MRSA by PCR Next Gen NOT DETECTED NOT DETECTED Final    Comment: (NOTE) The GeneXpert MRSA Assay (FDA approved for NASAL specimens only), is one component of Tery Hoeger comprehensive MRSA colonization surveillance program. It is not intended to diagnose MRSA infection nor to guide or monitor treatment for MRSA infections. Test performance is not FDA approved in patients less  than 81 years old. Performed at [facility], [address], [place], [place] [address]   Culture, blood (x 2)     Status: None (Preliminary result)   Collection Time: 10/12/20  1:10 AM   Specimen: BLOOD  Result Value Ref Range Status   Specimen Description BLOOD RIGHT ANTECUBITAL  Final   Special Requests   Final    BOTTLES DRAWN AEROBIC AND ANAEROBIC Blood Culture adequate volume   Culture   Final    NO GROWTH 3 DAYS Performed at [facility], [address], [place], [place] [address]    Report Status PENDING  Incomplete  Culture, blood (x 2)     Status: None (Preliminary result)   Collection Time: 10/12/20  1:10 AM   Specimen: BLOOD RIGHT HAND  Result Value Ref Range Status   Specimen Description BLOOD RIGHT HAND  Final   Special Requests   Final    BOTTLES DRAWN AEROBIC AND ANAEROBIC Blood Culture adequate volume   Culture   Final    NO GROWTH 3 DAYS Performed at [facility], [address], [place], [place] [address]    Report Status PENDING  Incomplete         Radiology Studies: No results found.      Scheduled Meds:  aspirin EC  81 mg Oral Daily   buPROPion  300 mg Oral Daily   calcium carbonate  1 tablet Oral BID WC   cephALEXin  500 mg Oral Q12H   docusate sodium  100 mg Oral BID   enoxaparin (LOVENOX) injection  40 mg Subcutaneous Q24H   feeding supplement  237 mL Oral TID BM   gabapentin  300 mg Oral Daily   And   gabapentin  600 mg Oral QHS   metoprolol succinate  50 mg Oral Daily   metroNIDAZOLE  500 mg Oral Q8H   multivitamin with minerals  1 tablet Oral Daily   polyethylene glycol  17 g Oral BID   rosuvastatin  10 mg Oral Daily   sacubitril-valsartan  1 tablet Oral BID   sodium chloride flush  3 mL Intravenous Q12H   sodium chloride flush  5 mL Intracatheter Q8H   spironolactone  12.5 mg Oral Daily   Continuous Infusions:  sodium chloride     lactated ringers 10 mL/hr at 10/15/20 0600   methocarbamol (ROBAXIN) IV        LOS: 6 days    Time spent: over 30 min    Lacretia Nicks, MD [facility]   To contact the attending provider between 7A-7P or the covering provider during after hours 7P-7A, please log into the web site [email or web address]  and access using universal [place] password for that web site. If you do not have the password, please call the hospital operator.  10/15/2020, 2:43 PM

## 2020-10-16 DIAGNOSIS — S72402A Unspecified fracture of lower end of left femur, initial encounter for closed fracture: Secondary | ICD-10-CM | POA: Diagnosis not present

## 2020-10-16 LAB — URINALYSIS, COMPLETE (UACMP) WITH MICROSCOPIC
Bacteria, UA: NONE SEEN
Bilirubin Urine: NEGATIVE
Glucose, UA: NEGATIVE mg/dL
Hgb urine dipstick: NEGATIVE
Ketones, ur: NEGATIVE mg/dL
Leukocytes,Ua: NEGATIVE
Nitrite: NEGATIVE
Protein, ur: NEGATIVE mg/dL
Specific Gravity, Urine: 1.008 (ref 1.005–1.030)
pH: 8 (ref 5.0–8.0)

## 2020-10-16 LAB — CBC
HCT: 25.3 % — ABNORMAL LOW (ref 36.0–46.0)
Hemoglobin: 8.2 g/dL — ABNORMAL LOW (ref 12.0–15.0)
MCH: 27 pg (ref 26.0–34.0)
MCHC: 32.4 g/dL (ref 30.0–36.0)
MCV: 83.2 fL (ref 80.0–100.0)
Platelets: 281 10*3/uL (ref 150–400)
RBC: 3.04 MIL/uL — ABNORMAL LOW (ref 3.87–5.11)
RDW: 17.6 % — ABNORMAL HIGH (ref 11.5–15.5)
WBC: 9.3 10*3/uL (ref 4.0–10.5)
nRBC: 0.6 % — ABNORMAL HIGH (ref 0.0–0.2)

## 2020-10-16 LAB — COMPREHENSIVE METABOLIC PANEL
ALT: 11 U/L (ref 0–44)
AST: 14 U/L — ABNORMAL LOW (ref 15–41)
Albumin: 2.2 g/dL — ABNORMAL LOW (ref 3.5–5.0)
Alkaline Phosphatase: 49 U/L (ref 38–126)
Anion gap: 6 (ref 5–15)
BUN: 8 mg/dL (ref 8–23)
CO2: 26 mmol/L (ref 22–32)
Calcium: 8.1 mg/dL — ABNORMAL LOW (ref 8.9–10.3)
Chloride: 107 mmol/L (ref 98–111)
Creatinine, Ser: 0.79 mg/dL (ref 0.44–1.00)
GFR, Estimated: >60 mL/min (ref >60)
Glucose, Bld: 96 mg/dL (ref 70–99)
Potassium: 4.4 mmol/L (ref 3.5–5.1)
Sodium: 139 mmol/L (ref 135–145)
Total Bilirubin: 0.8 mg/dL (ref 0.3–1.2)
Total Protein: 4.9 g/dL — ABNORMAL LOW (ref 6.5–8.1)

## 2020-10-16 LAB — MAGNESIUM: Magnesium: 1.9 mg/dL (ref 1.7–2.4)

## 2020-10-16 LAB — PHOSPHORUS: Phosphorus: 1.8 mg/dL — ABNORMAL LOW (ref 2.5–4.6)
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
## 2020-10-16 NOTE — Progress Notes (Signed)
PROGRESS NOTE    Heather [name]  JKD:[id number] DOB: [birth year] DOA: 10/09/2020 PCP: Janece Canterbury, MD   Chief Complaint  Patient presents with   Fall   Brief Narrative:  Heather [name] is Heather [name] 75 y.o. female with medical history significant for depression, hyperlipidemia, hypertension, asthma, L3 burst fracture s/p repair, who was brought to the hospital after Davell Beckstead fall at home.   She was diagnosed with Delton Stelle distal femoral fracture.  She was also found to have AKI, hyperkalemia, elevated troponins concerning for NSTEMI.  She was treated with IV fluids and analgesics.  She underwent left heart cath which showed minimal nonobstructive CAD.    She developed fever and tachycardia overnight 8/23-24.  She was started on broad spectrum abx and treated for sepsis.  RUQ US showed cholecystitis.  Surgery was c/s and she's now s/p perc chole drain.   Assessment & Plan:   Active Problems:   Essential hypertension   Hypotension   Femur fracture (HCC)   Prolonged QT interval   Abnormal ECG   AKI (acute kidney injury) (HCC)   Hyperkalemia   Elevated troponin   Leukocytosis   Malnutrition of moderate degree  Suspect she'll need rehab, either SNF vs CIR - > maybe good CIR candidate? Awaiting evaluation   Dysura Follow UA and culture  Sepsis 2/2 Acute Cholecystitis Fever, tachycardia, tachypnea - meets criteria for sepsis RUQ [place] with acute cholecystitis CXR with cardiomegaly with vascular congestion She's now s/p perc chole drain by IR 8/24 Appreciate surgery recs - defer lap chole at this time, perc chole by IR -> planning for drain placement with interval lap chole in 6-8 weeks.  Continue abx -> transition to PO for additional 7 days (keflex/flagyl (discussed with pharm) last day 9/2). Appreciate IR recs as well (8/26 note) Repeat blood cultures no growth  UA on admission not concerning for UTI.  1/2 sets of blood cultures from admission with coag negative staph, suspect  contaminant.  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  Acute Kidney Injury Creatinine 2.21 at presentation  Improving - follow metabolic panel tomorrow  Hypocalcemia  Hypophosphatemia Follow - no metabolic panel today Follow tomorrow  Prolonged Qtc Repeat EKG - wnl 8/25  Thrombocytopenia Continue to monitor - fluctuating, improved today  DVT prophylaxis: (heparin Code Status: (full  Family Communication: none at bedside Disposition:   Status is: Inpatient  Remains inpatient appropriate because:Inpatient level of care appropriate due to severity of illness  Dispo: The patient is from: Home              Anticipated d/c is to: Home              Patient currently is not medically stable to d/c.   Difficult to place patient No       Consultants:  Cardiology IR General surgery orthopedics  Procedures:  ORIF periprosthetic distal femur fx 8/25 8/24   IMPRESSION: Successful placement of Daniesha Driver 10.2 French percutaneous, subhepatic/transperitoneal cholecystostomy tube.  8/23   Prox LAD lesion is 30%  stenosed.   Prox RCA to Mid RCA lesion is 20% stenosed.   LV end diastolic pressure is normal.   Minimal nonobstructive CAD Normal LVEDP   Plan: medical management.  Echo IMPRESSIONS     1. Mild mid-apical anterolateral and posterolateral hypokinesis. Left  ventricular ejection fraction, by estimation, is 50 to 55%. The left  ventricle has low  normal function. The left ventricle demonstrates  regional wall motion abnormalities (see scoring   diagram/findings for description). There is mild concentric left  ventricular hypertrophy. Left ventricular diastolic parameters are  consistent with Grade II diastolic dysfunction (pseudonormalization).  Elevated left ventricular end-diastolic pressure.   2. Right ventricular systolic function is normal. The right ventricular  size is normal. There is mildly elevated pulmonary artery systolic  pressure.   3. Left atrial size was mildly dilated.   4. The mitral valve is normal in structure. No evidence of mitral valve  regurgitation. No evidence of mitral stenosis.   5. The aortic valve is tricuspid. Aortic valve regurgitation is not  visualized. No aortic stenosis is present.   6. The inferior vena cava is normal in size with greater than 50%  respiratory variability, suggesting right atrial pressure of 3 mmHg.   Antimicrobials:  Anti-infectives (From admission, onward)    Start     Dose/Rate Route Frequency Ordered Stop   10/15/20 2200  cephALEXin (KEFLEX) capsule 500 mg        500 mg Oral Every 12 hours 10/15/20 1057     10/15/20 1400  metroNIDAZOLE (FLAGYL) tablet 500 mg        500 mg Oral Every 8 hours 10/15/20 1057     10/13/20 1004  vancomycin (VANCOCIN) powder  Status:  Discontinued          As needed 10/13/20 1005 10/13/20 1055   10/13/20 0400  vancomycin (VANCOCIN) IVPB 1000 mg/200 mL premix  Status:  Discontinued        1,000 mg 200 mL/hr over 60 Minutes Intravenous Every 24 hours 10/12/20 0241 10/12/20 0810   10/12/20 1600  ceFEPIme (MAXIPIME) 2 g in sodium chloride 0.9 % 100 mL IVPB  Status:  Discontinued        2 g 200 mL/hr over 30 Minutes Intravenous Every 12 hours 10/12/20 0241 10/15/20 1057   10/12/20 1330  cefOXitin (MEFOXIN) 2 g in sodium chloride 0.9 % 100 mL IVPB        2 g 200 mL/hr over 30 Minutes Intravenous To Radiology 10/12/20 1241 10/13/20 1330    10/12/20 0330  metroNIDAZOLE (FLAGYL) IVPB 500 mg  Status:  Discontinued        500 mg 100 mL/hr over 60 Minutes Intravenous Every 8 hours 10/12/20 0235 10/15/20 1057   10/12/20 0330  vancomycin (VANCOCIN) IVPB 1000 mg/200 mL premix        1,000 mg 200 mL/hr over 60 Minutes Intravenous  Once 10/12/20 0237 10/12/20 1149   10/12/20 0330  ceFEPIme (MAXIPIME) 2 g in sodium chloride 0.9 % 100 mL IVPB        2 g 200 mL/hr over 30 Minutes Intravenous  Once 10/12/20 0237 10/12/20 1148   10/12/20 0100  vancomycin (VANCOREADY) IVPB 750 mg/150 mL  Status:  Discontinued        750 mg 150 mL/hr over 60 Minutes Intravenous Every 48 hours 10/09/20 2343 10/10/20 0958   10/10/20 0000  vancomycin (VANCOREADY) IVPB 1750 mg/350 mL        1,750 mg 175 mL/hr over 120  Minutes Intravenous  Once 10/09/20 2302 10/10/20 0308   10/09/20 2315  ceFEPIme (MAXIPIME) 2 g in sodium chloride 0.9 % 100 mL IVPB        2 g 200 mL/hr over 30 Minutes Intravenous  Once 10/09/20 2302 10/10/20 0026   10/09/20 2300  metroNIDAZOLE (FLAGYL) IVPB 500 mg  Status:  Discontinued        500 mg 100 mL/hr over 60 Minutes Intravenous Every 12 hours 10/09/20 2251 10/10/20 0958   10/09/20 2200  ceFEPIme (MAXIPIME) 2 g in sodium chloride 0.9 % 100 mL IVPB  Status:  Discontinued        2 g 200 mL/hr over 30 Minutes Intravenous Every 24 hours 10/09/20 2343 10/10/20 0958          Subjective: C/o dysuria  Objective: Vitals:   10/16/20 0942 10/16/20 1001 10/16/20 1030 10/16/20 1424  BP: 136/81  (!) 153/88 (!) 157/79  Pulse: 84  78 75  Resp:   15 18  Temp:  (!) 97.5 F (36.4 C) 99 F (37.2 C) 98.8 F (37.1 C)  TempSrc:   Oral Oral  SpO2:   97% 98%  Weight:      Height:        Intake/Output Summary (Last 24 hours) at 10/16/2020 1709 Last data filed at 10/16/2020 1621 Gross per 24 hour  Intake 609.94 ml  Output 1865 ml  Net -1255.06 ml   Filed Weights   10/10/20 2000  Weight: 58.2 kg    Examination:  General: No acute  distress. Cardiovascular:RRR Lungs: unlabored Abdomen: Soft, nontender, nondistended with normal active bowel sounds. No masses. No hepatosplenomegaly. Neurological: Alert and oriented 3. Moves all extremities 4 . Cranial nerves II through XII grossly intact. Skin: Warm and dry. No rashes or lesions. Extremities: L leg incisions well appearing     Data Reviewed: I have personally reviewed following labs and imaging studies  CBC: Recent Labs  Lab 10/09/20 1840 10/10/20 0639 10/11/20 0037 10/12/20 0110 10/13/20 0248 10/14/20 0213 10/16/20 0526  WBC 18.6* 13.1* 12.4* 10.6* 10.9* 9.2 9.3  NEUTROABS 13.7* 10.3*  --  8.4*  --   --   --   HGB 13.1 9.4* 9.9* 9.3* 8.4* 8.3* 8.2*  HCT 42.4 30.2* 30.2* 29.1* 25.8* 25.5* 25.3*  MCV 87.2 86.8 83.9 83.9 83.0 84.2 83.2  PLT 149* 116* 119* 115* 127* 146* 281    Basic Metabolic Panel: Recent Labs  Lab 10/10/20 0000 10/10/20 0540 10/11/20 0037 10/12/20 0110 10/14/20 0213 10/15/20 0336 10/16/20 0526  NA 141   < > 138 138 140 139 139  K 4.1   < > 3.7 4.1 4.2 3.4* 4.4  CL 116*   < > 114* 109 111 108 107  CO2 19*   < > 19* GLUCOSE 73   < > 100* 166* 154* 79 96  BUN 24*   < > CREATININE 1.40*   < > 0.86 0.81 0.76 0.72 0.79  CALCIUM 6.1*   < > 6.6* 7.9* 7.7* 7.9* 8.1*  MG 1.7  --   --  1.9  --  1.8 1.9  PHOS 2.5  --  1.4* 2.1*  --  1.8* 1.8*   < > = values in this interval not displayed.    GFR: Estimated Creatinine Clearance: 57.5 mL/min (by C-G formula based on SCr of 0.79 mg/dL).  Liver Function Tests: Recent Labs  Lab 10/10/20 0540 10/11/20 0037 10/12/20 0110  10/14/20 0213 10/15/20 0336 10/16/20 0526  AST 15  --  22 15 14* 14*  ALT 12  --  ALKPHOS 44  --  59 45 47 49  BILITOT 0.6  --  0.8 0.4 1.0 0.8  PROT 4.7*  --  5.5* 4.9* 4.9* 4.9*  ALBUMIN 2.5* 2.2* 3.0* 2.4* 2.3* 2.2*    CBG: No results for input(s): GLUCAP in the last 168 hours.   Recent Results (from the past  240 hour(s))  Resp Panel by RT-PCR (Flu Emiyah Spraggins&B, Covid) Nasopharyngeal Swab     Status: None   Collection Time: 10/09/20  6:41 PM   Specimen: Nasopharyngeal Swab; Nasopharyngeal(NP) swabs in vial transport medium  Result Value Ref Range Status   SARS Coronavirus 2 by RT PCR NEGATIVE NEGATIVE Final    Comment: (NOTE) SARS-CoV-2 target nucleic acids are NOT DETECTED.  The SARS-CoV-2 RNA is generally detectable in upper respiratory specimens during the acute phase of infection. The lowest concentration of SARS-CoV-2 viral copies this assay can detect is 138 copies/mL. Darien Mignogna negative result does not preclude SARS-Cov-2 infection and should not be used as the sole basis for treatment or other patient management decisions. Haydn Cush negative result may occur with  improper specimen collection/handling, submission of specimen other than nasopharyngeal swab, presence of viral mutation(s) within the areas targeted by this assay, and inadequate number of viral copies(<138 copies/mL). Else Habermann negative result must be combined with clinical observations, patient history, and epidemiological information. The expected result is Negative.  Fact Sheet for Patients:  BloggerCourse.com  Fact Sheet for Healthcare Providers:  SeriousBroker.it  This test is no t yet approved or cleared by the [place] FDA and  has been authorized for detection and/or diagnosis of SARS-CoV-2 by FDA under an Emergency Use Authorization (EUA). This EUA will remain  in effect (meaning this test can be used) for the duration of the COVID-19 declaration under Section 564(b)(1) of the Act, 21 U.S.C.section 360bbb-3(b)(1), unless the authorization is terminated  or revoked sooner.       Influenza Rozella Servello by PCR NEGATIVE NEGATIVE Final   Influenza B by PCR NEGATIVE NEGATIVE Final    Comment: (NOTE) The Xpert Xpress SARS-CoV-2/FLU/RSV plus assay is intended as an aid in the diagnosis of influenza  from Nasopharyngeal swab specimens and should not be used as Naydeline Morace sole basis for treatment. Nasal washings and aspirates are unacceptable for Xpert Xpress SARS-CoV-2/FLU/RSV testing.  Fact Sheet for Patients: BloggerCourse.com  Fact Sheet for Healthcare Providers: SeriousBroker.it  This test is not yet approved or cleared by the [place] FDA and has been authorized for detection and/or diagnosis of SARS-CoV-2 by FDA under an Emergency Use Authorization (EUA). This EUA will remain in effect (meaning this test can be used) for the duration of the COVID-19 declaration under Section 564(b)(1) of the Act, 21 U.S.C. section 360bbb-3(b)(1), unless the authorization is terminated or revoked.  Performed at [facility], [address], [place], [place] [address]   Culture, blood (routine x 2)     Status: None   Collection Time: 10/09/20  7:55 PM   Specimen: BLOOD  Result Value Ref Range Status   Specimen Description   Final    BLOOD RIGHT ANTECUBITAL Performed at [facility], [address], [place], [place] [address]    Special Requests   Final    BOTTLES DRAWN AEROBIC AND ANAEROBIC Blood Culture results may not be optimal due to an inadequate volume of blood  received in culture bottles Performed at [facility], [address] [place], [place] [address]    Culture   Final    NO GROWTH 5 DAYS Performed at [facility], [address] [place], [place] [address]    Report Status 10/15/2020 FINAL  Final  Culture, blood (routine x 2)     Status: Abnormal   Collection Time: 10/09/20  7:55 PM   Specimen: BLOOD  Result Value Ref Range Status   Specimen Description   Final    BLOOD LEFT ANTECUBITAL Performed at [facility], [address] [place], [place] [address]    Special Requests   Final    BOTTLES DRAWN AEROBIC AND ANAEROBIC Blood Culture  results may not be optimal due to an inadequate volume of blood received in culture bottles Performed at [facility], [address], [place], [place] [address]    Culture  Setup Time   Final    GRAM POSITIVE COCCI IN CLUSTERS IN BOTH AEROBIC AND ANAEROBIC BOTTLES CRITICAL RESULT CALLED TO, READ BACK BY AND VERIFIED WITH: L SEAY,PHARMD@2330  10/10/20 MK    Culture (Bereket Gernert)  Final    STAPHYLOCOCCUS EPIDERMIDIS STAPHYLOCOCCUS HOMINIS THE SIGNIFICANCE OF ISOLATING THIS ORGANISM FROM Rebbecca Osuna SINGLE SET OF BLOOD CULTURES WHEN MULTIPLE SETS ARE DRAWN IS UNCERTAIN. PLEASE NOTIFY THE MICROBIOLOGY DEPARTMENT WITHIN ONE WEEK IF SPECIATION AND SENSITIVITIES ARE REQUIRED. Performed at [facility], [address] [place], [place] [address]    Report Status 10/13/2020 FINAL  Final  Blood Culture ID Panel (Reflexed)     Status: Abnormal   Collection Time: 10/09/20  7:55 PM  Result Value Ref Range Status   Enterococcus faecalis NOT DETECTED NOT DETECTED Final   Enterococcus Faecium NOT DETECTED NOT DETECTED Final   Listeria monocytogenes NOT DETECTED NOT DETECTED Final   Staphylococcus species DETECTED (Tiahna Cure) NOT DETECTED Final    Comment: CRITICAL RESULT CALLED TO, READ BACK BY AND VERIFIED WITH: L SEAY,PHARMD@2330  10/10/20 MK    Staphylococcus aureus (BCID) NOT DETECTED NOT DETECTED Final   Staphylococcus epidermidis DETECTED (Samiha Denapoli) NOT DETECTED Final    Comment: Methicillin (oxacillin) resistant coagulase negative staphylococcus. Possible blood culture contaminant (unless isolated from more than one blood culture draw or clinical case suggests pathogenicity). No antibiotic treatment is indicated for blood  culture contaminants. CRITICAL RESULT CALLED TO, READ BACK BY AND VERIFIED WITH: L SEAY,PHARMD@2330  10/10/20 MK    Staphylococcus lugdunensis NOT DETECTED NOT DETECTED Final   Streptococcus species NOT DETECTED NOT DETECTED Final   Streptococcus agalactiae NOT DETECTED NOT DETECTED  Final   Streptococcus pneumoniae NOT DETECTED NOT DETECTED Final   Streptococcus pyogenes NOT DETECTED NOT DETECTED Final   Florence Yeung.calcoaceticus-baumannii NOT DETECTED NOT DETECTED Final   Bacteroides fragilis NOT DETECTED NOT DETECTED Final   Enterobacterales NOT DETECTED NOT DETECTED Final   Enterobacter cloacae complex NOT DETECTED NOT DETECTED Final   Escherichia coli NOT DETECTED NOT DETECTED Final   Klebsiella aerogenes NOT DETECTED NOT DETECTED Final   Klebsiella oxytoca NOT DETECTED NOT DETECTED Final   Klebsiella pneumoniae NOT DETECTED NOT DETECTED Final   Proteus species NOT DETECTED NOT DETECTED Final   Salmonella species NOT DETECTED NOT DETECTED Final   Serratia marcescens NOT DETECTED NOT DETECTED Final   Haemophilus influenzae NOT DETECTED NOT DETECTED Final   Neisseria meningitidis NOT DETECTED NOT DETECTED Final   Pseudomonas aeruginosa NOT DETECTED NOT DETECTED Final   Stenotrophomonas maltophilia NOT DETECTED NOT DETECTED Final  Candida albicans NOT DETECTED NOT DETECTED Final   Candida auris NOT DETECTED NOT DETECTED Final   Candida glabrata NOT DETECTED NOT DETECTED Final   Candida krusei NOT DETECTED NOT DETECTED Final   Candida parapsilosis NOT DETECTED NOT DETECTED Final   Candida tropicalis NOT DETECTED NOT DETECTED Final   Cryptococcus neoformans/gattii NOT DETECTED NOT DETECTED Final   Methicillin resistance mecA/C DETECTED (Amylah Will) NOT DETECTED Final    Comment: CRITICAL RESULT CALLED TO, READ BACK BY AND VERIFIED WITH: L SEAY,PHARMD  10/10/20 MK Performed at [facility], [address], [place], [place] [address]   MRSA Next Gen by PCR, Nasal     Status: None   Collection Time: 10/10/20  4:17 PM   Specimen: Nasal Mucosa; Nasal Swab  Result Value Ref Range Status   MRSA by PCR Next Gen NOT DETECTED NOT DETECTED Final    Comment: (NOTE) The GeneXpert MRSA Assay (FDA approved for NASAL specimens only), is one component of Kadin Bera comprehensive MRSA  colonization surveillance program. It is not intended to diagnose MRSA infection nor to guide or monitor treatment for MRSA infections. Test performance is not FDA approved in patients less than 53 years old. Performed at [facility], [address] [place], [place] [address]   Culture, blood (x 2)     Status: None (Preliminary result)   Collection Time: 10/12/20  1:10 AM   Specimen: BLOOD  Result Value Ref Range Status   Specimen Description BLOOD RIGHT ANTECUBITAL  Final   Special Requests   Final    BOTTLES DRAWN AEROBIC AND ANAEROBIC Blood Culture adequate volume   Culture   Final    NO GROWTH 4 DAYS Performed at [facility], [address] [place], [place] [address]    Report Status PENDING  Incomplete  Culture, blood (x 2)     Status: None (Preliminary result)   Collection Time: 10/12/20  1:10 AM   Specimen: BLOOD RIGHT HAND  Result Value Ref Range Status   Specimen Description BLOOD RIGHT HAND  Final   Special Requests   Final    BOTTLES DRAWN AEROBIC AND ANAEROBIC Blood Culture adequate volume   Culture   Final    NO GROWTH 4 DAYS Performed at [facility], [address] [place], [place] [address]    Report Status PENDING  Incomplete         Radiology Studies: No results found.      Scheduled Meds:  aspirin EC  81 mg Oral Daily   buPROPion  300 mg Oral Daily   calcium carbonate  1 tablet Oral BID WC   cephALEXin  500 mg Oral Q12H   docusate sodium  100 mg Oral BID   enoxaparin (LOVENOX) injection  40 mg Subcutaneous Q24H   feeding supplement  237 mL Oral TID BM   gabapentin  300 mg Oral Daily   And   gabapentin  600 mg Oral QHS   metoprolol succinate  50 mg Oral Daily   metroNIDAZOLE  500 mg Oral Q8H   multivitamin with minerals  1 tablet Oral Daily   polyethylene glycol  17 g Oral BID   rosuvastatin  10 mg Oral Daily   sacubitril-valsartan  1 tablet Oral BID   sodium chloride flush  3 mL Intravenous Q12H   sodium chloride  flush  5 mL Intracatheter Q8H   spironolactone  12.5 mg Oral Daily   Continuous Infusions:  sodium chloride  lactated ringers 10 mL/hr at 10/16/20 0600   methocarbamol (ROBAXIN) IV       LOS: 7 days    Time spent: over 30 min    Lacretia Nicks, MD [facility]   To contact the attending provider between 7A-7P or the covering provider during after hours 7P-7A, please log into the web site [email or web address] and access using universal [facility] password for that web site. If you do not have the password, please call the hospital operator.  10/16/2020, 5:09 PM

## 2020-10-17 DIAGNOSIS — S72402A Unspecified fracture of lower end of left femur, initial encounter for closed fracture: Secondary | ICD-10-CM | POA: Diagnosis not present

## 2020-10-17 LAB — CULTURE, BLOOD (ROUTINE X 2)
Culture: NO GROWTH
Culture: NO GROWTH
Special Requests: ADEQUATE
Special Requests: ADEQUATE

## 2020-10-17 LAB — CBC
HCT: 28.5 % — ABNORMAL LOW (ref 36.0–46.0)
Hemoglobin: 9.2 g/dL — ABNORMAL LOW (ref 12.0–15.0)
MCH: 26.6 pg (ref 26.0–34.0)
MCHC: 32.3 g/dL (ref 30.0–36.0)
MCV: 82.4 fL (ref 80.0–100.0)
Platelets: 332 10*3/uL (ref 150–400)
RBC: 3.46 MIL/uL — ABNORMAL LOW (ref 3.87–5.11)
RDW: 17.7 % — ABNORMAL HIGH (ref 11.5–15.5)
WBC: 10 10*3/uL (ref 4.0–10.5)
nRBC: 0.7 % — ABNORMAL HIGH (ref 0.0–0.2)

## 2020-10-17 LAB — URINE CULTURE: Culture: <10000 — AB

## 2020-10-17 MED ORDER — K PHOS MONO-SOD PHOS DI & MONO 155-852-130 MG PO TABS
500.0000 mg | ORAL_TABLET | Freq: Four times a day (QID) | ORAL | Status: DC
  Filled 2020-10-17 (×2): qty 2

## 2020-10-17 NOTE — Care Management Important Message (Signed)
Important Message  Patient Details  Name: Heather [name] MRN: [id number] Date of Birth: Sep 26, 1945   Medicare Important Message Given:  Yes     [name] 10/17/2020, 12:03 PM

## 2020-10-17 NOTE — Progress Notes (Signed)
PROGRESS NOTE    Heather [name]  WUJ:[id number] DOB: [birth year] DOA: 10/09/2020 PCP: Janece Canterbury, MD   Chief Complaint  Patient presents with   Fall   Brief Narrative:  Heather [name] is Heather [name] 75 y.o. female with medical history significant for depression, hyperlipidemia, hypertension, asthma, L3 burst fracture s/p repair, who was brought to the hospital after Heather [name] fall at home.   She was diagnosed with Kauan Kloosterman distal femoral fracture.  She was also found to have AKI, hyperkalemia, elevated troponins concerning for NSTEMI.  She was treated with IV fluids and analgesics.  She underwent left heart cath which showed minimal nonobstructive CAD.    She developed fever and tachycardia overnight 8/23-24.  She was started on broad spectrum abx and treated for sepsis.  RUQ US showed cholecystitis.  Surgery was c/s and she's now s/p perc chole drain.   Assessment & Plan:   Active Problems:   Essential hypertension   Hypotension   Femur fracture (HCC)   Prolonged QT interval   Abnormal ECG   AKI (acute kidney injury) (HCC)   Hyperkalemia   Elevated troponin   Leukocytosis   Malnutrition of moderate degree  Suspect she'll need rehab, either SNF vs CIR - > maybe good CIR candidate? Awaiting evaluation   Dysura Follow UA and culture - not suggestive of UTI  Sepsis 2/2 Acute Cholecystitis Fever, tachycardia, tachypnea - meets criteria for sepsis RUQ [place] with acute cholecystitis CXR with cardiomegaly with vascular congestion She's now s/p perc chole drain by IR 8/24 Appreciate surgery recs - defer lap chole at this time, perc chole by IR -> planning for drain placement with interval lap chole in 6-8 weeks.  Continue abx -> transition to PO for additional 7 days (keflex/flagyl (discussed with pharm) last day 9/2). Appreciate IR recs as well (8/26 note) Repeat blood cultures no growth  UA on admission not concerning for UTI.  1/2 sets of blood cultures from admission with coag negative  staph, suspect contaminant.  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   Difficult to place patient No       Consultants:  Cardiology IR General surgery orthopedics  Procedures:  ORIF periprosthetic distal femur fx 8/25 8/24   IMPRESSION: Successful placement of Heather [name] 10.2 French percutaneous, subhepatic/transperitoneal cholecystostomy tube.  8/23   Prox LAD lesion is 30% stenosed.   Prox RCA  to Mid RCA lesion is 20% stenosed.   LV end diastolic pressure is normal.   Minimal nonobstructive CAD Normal LVEDP   Plan: medical management.  Echo IMPRESSIONS     1. Mild mid-apical anterolateral and posterolateral hypokinesis. Left  ventricular ejection fraction, by estimation, is 50 to 55%. The left  ventricle has low normal function. The left ventricle demonstrates   regional wall motion abnormalities (see scoring   diagram/findings for description). There is mild concentric left  ventricular hypertrophy. Left ventricular diastolic parameters are  consistent with Grade II diastolic dysfunction (pseudonormalization).  Elevated left ventricular end-diastolic pressure.   2. Right ventricular systolic function is normal. The right ventricular  size is normal. There is mildly elevated pulmonary artery systolic  pressure.   3. Left atrial size was mildly dilated.   4. The mitral valve is normal in structure. No evidence of mitral valve  regurgitation. No evidence of mitral stenosis.   5. The aortic valve is tricuspid. Aortic valve regurgitation is not  visualized. No aortic stenosis is present.   6. The inferior vena cava is normal in size with greater than 50%  respiratory variability, suggesting right atrial pressure of 3 mmHg.   Antimicrobials:  Anti-infectives (From admission, onward)    Start     Dose/Rate Route Frequency Ordered Stop   10/15/20 2200  cephALEXin (KEFLEX) capsule 500 mg        500 mg Oral Every 12 hours 10/15/20 1057     10/15/20 1400  metroNIDAZOLE (FLAGYL) tablet 500 mg        500 mg Oral Every 8 hours 10/15/20 1057     10/13/20 1004  vancomycin (VANCOCIN) powder  Status:  Discontinued          As needed 10/13/20 1005 10/13/20 1055   10/13/20 0400  vancomycin (VANCOCIN) IVPB 1000 mg/200 mL premix  Status:  Discontinued        1,000 mg 200 mL/hr over 60 Minutes Intravenous Every 24 hours 10/12/20 0241 10/12/20 0810   10/12/20 1600  ceFEPIme (MAXIPIME) 2 g in sodium chloride 0.9 % 100 mL IVPB  Status:  Discontinued        2 g 200 mL/hr over 30 Minutes Intravenous Every 12 hours 10/12/20 0241 10/15/20 1057   10/12/20 1330  cefOXitin (MEFOXIN) 2 g in sodium chloride 0.9 % 100 mL IVPB        2 g 200 mL/hr over 30 Minutes Intravenous To Radiology 10/12/20 1241 10/13/20 1330   10/12/20 0330  metroNIDAZOLE (FLAGYL) IVPB 500 mg   Status:  Discontinued        500 mg 100 mL/hr over 60 Minutes Intravenous Every 8 hours 10/12/20 0235 10/15/20 1057   10/12/20 0330  vancomycin (VANCOCIN) IVPB 1000 mg/200 mL premix        1,000 mg 200 mL/hr over 60 Minutes Intravenous  Once 10/12/20 0237 10/12/20 1149   10/12/20 0330  ceFEPIme (MAXIPIME) 2 g in sodium chloride 0.9 % 100 mL IVPB        2 g 200 mL/hr over 30 Minutes Intravenous  Once 10/12/20 0237 10/12/20 1148   10/12/20 0100  vancomycin (VANCOREADY) IVPB 750 mg/150 mL  Status:  Discontinued        750 mg 150 mL/hr over 60 Minutes Intravenous Every 48 hours 10/09/20 2343 10/10/20 0958   10/10/20 0000  vancomycin (VANCOREADY) IVPB 1750 mg/350 mL        1,750 mg 175 mL/hr over 120 Minutes Intravenous  Once 10/09/20  2302 10/10/20 0308   10/09/20 2315  ceFEPIme (MAXIPIME) 2 g in sodium chloride 0.9 % 100 mL IVPB        2 g 200 mL/hr over 30 Minutes Intravenous  Once 10/09/20 2302 10/10/20 0026   10/09/20 2300  metroNIDAZOLE (FLAGYL) IVPB 500 mg  Status:  Discontinued        500 mg 100 mL/hr over 60 Minutes Intravenous Every 12 hours 10/09/20 2251 10/10/20 0958   10/09/20 2200  ceFEPIme (MAXIPIME) 2 g in sodium chloride 0.9 % 100 mL IVPB  Status:  Discontinued        2 g 200 mL/hr over 30 Minutes Intravenous Every 24 hours 10/09/20 2343 10/10/20 0958          Subjective: No complaints  Objective: Vitals:   10/16/20 1951 10/16/20 2337 10/17/20 0451 10/17/20 0940  BP: (!) 158/89 (!) 145/70 134/81 (!) 142/74  Pulse: 79 79 84 79  Resp: Temp: 99.1 F (37.3 C)  98.4 F (36.9 C)   TempSrc: Oral  Oral   SpO2:  96% 97%   Weight:      Height:        Intake/Output Summary (Last 24 hours) at 10/17/2020 1720 Last data filed at 10/17/2020 1444 Gross per 24 hour  Intake 692.25 ml  Output 2875 ml  Net -2182.75 ml   Filed Weights   10/10/20 2000  Weight: 58.2 kg    Examination:  General: No acute distress. Cardiovascular: Heart sounds show Heather [name] regular  rate, and rhythm.  Lungs: Clear to auscultation bilaterally  Abdomen: Soft, nontender, nondistended  Neurological: Alert and oriented 3. Moves all extremities 4 . Cranial nerves II through XII grossly intact. Skin: Warm and dry. No rashes or lesions. Extremities: well appearing incisions      Data Reviewed: I have personally reviewed following labs and imaging studies  CBC: Recent Labs  Lab 10/12/20 0110 10/13/20 0248 10/14/20 0213 10/16/20 0526 10/17/20 0111  WBC 10.6* 10.9* 9.2 9.3 10.0  NEUTROABS 8.4*  --   --   --   --   HGB 9.3* 8.4* 8.3* 8.2* 9.2*  HCT 29.1* 25.8* 25.5* 25.3* 28.5*  MCV 83.9 83.0 84.2 83.2 82.4  PLT 115* 127* 146* 281 332    Basic Metabolic Panel: Recent Labs  Lab 10/11/20 0037 10/12/20 0110 10/14/20 0213 10/15/20 0336 10/16/20 0526  NA 138 138 140 139 139  K 3.7 4.1 4.2 3.4* 4.4  CL 114* 109 111 108 107  CO2 19* GLUCOSE 100* 166* 154* 79 96  BUN CREATININE 0.86 0.81 0.76 0.72 0.79  CALCIUM 6.6* 7.9* 7.7* 7.9* 8.1*  MG  --  1.9  --  1.8 1.9  PHOS 1.4* 2.1*  --  1.8* 1.8*    GFR: Estimated Creatinine Clearance: 57.5 mL/min (by C-G formula based on SCr of 0.79 mg/dL).  Liver Function Tests: Recent Labs  Lab 10/11/20 0037 10/12/20 0110 10/14/20 0213 10/15/20 0336 10/16/20 0526  AST  --  22 15 14* 14*  ALT  --  ALKPHOS  --  59 45 47 49  BILITOT  --  0.8 0.4 1.0 0.8  PROT  --  5.5* 4.9* 4.9* 4.9*  ALBUMIN 2.2* 3.0* 2.4* 2.3* 2.2*    CBG: No results for input(s): GLUCAP in the last 168 hours.   Recent Results (from the past 240 hour(s))  Resp Panel by  RT-PCR (Flu Nazly Digilio&B, Covid) Nasopharyngeal Swab     Status: None   Collection Time: 10/09/20  6:41 PM   Specimen: Nasopharyngeal Swab; Nasopharyngeal(NP) swabs in vial transport medium  Result Value Ref Range Status   SARS Coronavirus 2 by RT PCR NEGATIVE NEGATIVE Final    Comment: (NOTE) SARS-CoV-2 target nucleic acids are NOT  DETECTED.  The SARS-CoV-2 RNA is generally detectable in upper respiratory specimens during the acute phase of infection. The lowest concentration of SARS-CoV-2 viral copies this assay can detect is 138 copies/mL. Heather [name] negative result does not preclude SARS-Cov-2 infection and should not be used as the sole basis for treatment or other patient management decisions. Heather [name] negative result may occur with  improper specimen collection/handling, submission of specimen other than nasopharyngeal swab, presence of viral mutation(s) within the areas targeted by this assay, and inadequate number of viral copies(<138 copies/mL). Heather [name] negative result must be combined with clinical observations, patient history, and epidemiological information. The expected result is Negative.  Fact Sheet for Patients:  BloggerCourse.com  Fact Sheet for Healthcare Providers:  SeriousBroker.it  This test is no t yet approved or cleared by the [place] FDA and  has been authorized for detection and/or diagnosis of SARS-CoV-2 by FDA under an Emergency Use Authorization (EUA). This EUA will remain  in effect (meaning this test can be used) for the duration of the COVID-19 declaration under Section 564(b)(1) of the Act, 21 U.S.C.section 360bbb-3(b)(1), unless the authorization is terminated  or revoked sooner.       Influenza Kaelan Emami by PCR NEGATIVE NEGATIVE Final   Influenza B by PCR NEGATIVE NEGATIVE Final    Comment: (NOTE) The Xpert Xpress SARS-CoV-2/FLU/RSV plus assay is intended as an aid in the diagnosis of influenza from Nasopharyngeal swab specimens and should not be used as Heather [name] sole basis for treatment. Nasal washings and aspirates are unacceptable for Xpert Xpress SARS-CoV-2/FLU/RSV testing.  Fact Sheet for Patients: BloggerCourse.com  Fact Sheet for Healthcare Providers: SeriousBroker.it  This test is not yet  approved or cleared by the [place] FDA and has been authorized for detection and/or diagnosis of SARS-CoV-2 by FDA under an Emergency Use Authorization (EUA). This EUA will remain in effect (meaning this test can be used) for the duration of the COVID-19 declaration under Section 564(b)(1) of the Act, 21 U.S.C. section 360bbb-3(b)(1), unless the authorization is terminated or revoked.  Performed at [facility], [address] [place], [place] [address]   Culture, blood (routine x 2)     Status: None   Collection Time: 10/09/20  7:55 PM   Specimen: BLOOD  Result Value Ref Range Status   Specimen Description   Final    BLOOD RIGHT ANTECUBITAL Performed at [facility], [address], [place], [place] [address]    Special Requests   Final    BOTTLES DRAWN AEROBIC AND ANAEROBIC Blood Culture results may not be optimal due to an inadequate volume of blood received in culture bottles Performed at [facility], [address], [place], [place] [address]    Culture   Final    NO GROWTH 5 DAYS Performed at [facility], [address], [place], [place] [address]    Report Status 10/15/2020 FINAL  Final  Culture, blood (routine x 2)     Status: Abnormal   Collection Time: 10/09/20  7:55 PM   Specimen: BLOOD  Result Value Ref Range Status   Specimen Description   Final  BLOOD LEFT ANTECUBITAL Performed at [facility], [address], [place], [place] [address]    Special Requests   Final    BOTTLES DRAWN AEROBIC AND ANAEROBIC Blood Culture results may not be optimal due to an inadequate volume of blood received in culture bottles Performed at [facility], [address] [place], [place] [address]    Culture  Setup Time   Final    GRAM POSITIVE COCCI IN CLUSTERS IN BOTH AEROBIC AND ANAEROBIC BOTTLES CRITICAL RESULT CALLED TO, READ BACK BY AND VERIFIED WITH: L  SEAY,PHARMD@2330  10/10/20 MK    Culture (Heather [name])  Final    STAPHYLOCOCCUS EPIDERMIDIS STAPHYLOCOCCUS HOMINIS THE SIGNIFICANCE OF ISOLATING THIS ORGANISM FROM Heather [name] SINGLE SET OF BLOOD CULTURES WHEN MULTIPLE SETS ARE DRAWN IS UNCERTAIN. PLEASE NOTIFY THE MICROBIOLOGY DEPARTMENT WITHIN ONE WEEK IF SPECIATION AND SENSITIVITIES ARE REQUIRED. Performed at [facility], [address] [place], [place] [address]    Report Status 10/13/2020 FINAL  Final  Blood Culture ID Panel (Reflexed)     Status: Abnormal   Collection Time: 10/09/20  7:55 PM  Result Value Ref Range Status   Enterococcus faecalis NOT DETECTED NOT DETECTED Final   Enterococcus Faecium NOT DETECTED NOT DETECTED Final   Listeria monocytogenes NOT DETECTED NOT DETECTED Final   Staphylococcus species DETECTED (Heather [name]) NOT DETECTED Final    Comment: CRITICAL RESULT CALLED TO, READ BACK BY AND VERIFIED WITH: L SEAY,PHARMD@2330  10/10/20 MK    Staphylococcus aureus (BCID) NOT DETECTED NOT DETECTED Final   Staphylococcus epidermidis DETECTED (Heather [name]) NOT DETECTED Final    Comment: Methicillin (oxacillin) resistant coagulase negative staphylococcus. Possible blood culture contaminant (unless isolated from more than one blood culture draw or clinical case suggests pathogenicity). No antibiotic treatment is indicated for blood  culture contaminants. CRITICAL RESULT CALLED TO, READ BACK BY AND VERIFIED WITH: L SEAY,PHARMD@2330  10/10/20 MK    Staphylococcus lugdunensis NOT DETECTED NOT DETECTED Final   Streptococcus species NOT DETECTED NOT DETECTED Final   Streptococcus agalactiae NOT DETECTED NOT DETECTED Final   Streptococcus pneumoniae NOT DETECTED NOT DETECTED Final   Streptococcus pyogenes NOT DETECTED NOT DETECTED Final   Heather [name].calcoaceticus-baumannii NOT DETECTED NOT DETECTED Final   Bacteroides fragilis NOT DETECTED NOT DETECTED Final   Enterobacterales NOT DETECTED NOT DETECTED Final   Enterobacter cloacae complex NOT DETECTED NOT DETECTED  Final   Escherichia coli NOT DETECTED NOT DETECTED Final   Klebsiella aerogenes NOT DETECTED NOT DETECTED Final   Klebsiella oxytoca NOT DETECTED NOT DETECTED Final   Klebsiella pneumoniae NOT DETECTED NOT DETECTED Final   Proteus species NOT DETECTED NOT DETECTED Final   Salmonella species NOT DETECTED NOT DETECTED Final   Serratia marcescens NOT DETECTED NOT DETECTED Final   Haemophilus influenzae NOT DETECTED NOT DETECTED Final   Neisseria meningitidis NOT DETECTED NOT DETECTED Final   Pseudomonas aeruginosa NOT DETECTED NOT DETECTED Final   Stenotrophomonas maltophilia NOT DETECTED NOT DETECTED Final   Candida albicans NOT DETECTED NOT DETECTED Final   Candida auris NOT DETECTED NOT DETECTED Final   Candida glabrata NOT DETECTED NOT DETECTED Final   Candida krusei NOT DETECTED NOT DETECTED Final   Candida parapsilosis NOT DETECTED NOT DETECTED Final   Candida tropicalis NOT DETECTED NOT DETECTED Final   Cryptococcus neoformans/gattii NOT DETECTED NOT DETECTED Final   Methicillin resistance mecA/C DETECTED (Heather [name]) NOT DETECTED Final    Comment: CRITICAL RESULT CALLED TO, READ BACK BY AND VERIFIED WITH: L SEAY,PHARMD  10/10/20 MK Performed at [facility], [address], [place], [place] [address]   MRSA Next Gen by PCR, Nasal     Status: None   Collection Time: 10/10/20  4:17 PM   Specimen: Nasal Mucosa; Nasal Swab  Result Value Ref Range Status   MRSA by PCR Next Gen NOT DETECTED NOT DETECTED Final    Comment: (NOTE) The GeneXpert MRSA Assay (FDA approved for NASAL specimens only), is one component of Antonisha Waskey comprehensive MRSA colonization surveillance program. It is not intended to diagnose MRSA infection nor to guide or monitor treatment for MRSA infections. Test performance is not FDA approved in patients less than 28 years old. Performed at [facility], [address] [place], [place] [address]   Culture, blood (x 2)     Status: None   Collection Time:  10/12/20  1:10 AM   Specimen: BLOOD  Result Value Ref Range Status   Specimen Description BLOOD RIGHT ANTECUBITAL  Final   Special Requests   Final    BOTTLES DRAWN AEROBIC AND ANAEROBIC Blood Culture adequate volume   Culture   Final    NO GROWTH 5 DAYS Performed at [facility], [address] [place], [place] [address]    Report Status 10/17/2020 FINAL  Final  Culture, blood (x 2)     Status: None   Collection Time: 10/12/20  1:10 AM   Specimen: BLOOD RIGHT HAND  Result Value Ref Range Status   Specimen Description BLOOD RIGHT HAND  Final   Special Requests   Final    BOTTLES DRAWN AEROBIC AND ANAEROBIC Blood Culture adequate volume   Culture   Final    NO GROWTH 5 DAYS Performed at [facility], [address], [place], [place] [address]    Report Status 10/17/2020 FINAL  Final  Urine Culture     Status: Abnormal   Collection Time: 10/16/20 10:40 AM   Specimen: Urine, Clean Catch  Result Value Ref Range Status   Specimen Description URINE, CLEAN CATCH  Final   Special Requests NONE  Final   Culture (Heather [name] Rabelo)  Final    <10,000 COLONIES/mL INSIGNIFICANT GROWTH Performed at [facility], [address], [place], [place] [address]    Report Status 10/17/2020 FINAL  Final         Radiology Studies: No results found.      Scheduled Meds:  aspirin EC  81 mg Oral Daily   buPROPion  300 mg Oral Daily   calcium carbonate  1 tablet Oral BID WC   cephALEXin  500 mg Oral Q12H   docusate sodium  100 mg Oral BID   enoxaparin (LOVENOX) injection  40 mg Subcutaneous Q24H   feeding supplement  237 mL Oral TID BM   gabapentin  300 mg Oral Daily   And   gabapentin  600 mg Oral QHS   metoprolol succinate  50 mg Oral Daily   metroNIDAZOLE  500 mg Oral Q8H   multivitamin with minerals  1 tablet Oral Daily   polyethylene glycol  17 g Oral BID   rosuvastatin  10 mg Oral Daily   sacubitril-valsartan  1 tablet Oral BID   sodium chloride flush  3 mL Intravenous  Q12H   sodium chloride flush  5 mL Intracatheter Q8H   spironolactone  12.5 mg Oral Daily   Continuous Infusions:  sodium chloride     lactated ringers 10 mL/hr at 10/17/20 1444   methocarbamol (ROBAXIN) IV  LOS: 8 days    Time spent: over 30 min    Lacretia Nicks, MD [facility]   To contact the attending provider between 7A-7P or the covering provider during after hours 7P-7A, please log into the web site [email or web address] and access using universal [place] password for that web site. If you do not have the password, please call the hospital operator.  10/17/2020, 5:20 PM
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
## 2020-10-17 NOTE — Progress Notes (Signed)
Inpatient Rehab Admissions Coordinator:  Saw pt at bedside to inform her that insurance authorization started today. Will continue to follow.   Wolfgang Phoenix, MS, CCC-SLP Admissions Coordinator [phone]

## 2020-10-18 ENCOUNTER — Inpatient Hospital Stay (HOSPITAL_COMMUNITY)
Admission: RE | Admit: 2020-10-18 | Discharge: 2020-10-28 | DRG: 559 | Disposition: A | Discharge status: Home / Self Care | Payer: Medicare HMO | Source: Intra-hospital | Attending: Physical Medicine and Rehabilitation | Admitting: Physical Medicine and Rehabilitation

## 2020-10-18 ENCOUNTER — Inpatient Hospital Stay (HOSPITAL_COMMUNITY): Payer: Medicare HMO

## 2020-10-18 ENCOUNTER — Other Ambulatory Visit: Payer: Self-pay

## 2020-10-18 ENCOUNTER — Encounter (HOSPITAL_COMMUNITY): Payer: Self-pay | Admitting: Physical Medicine and Rehabilitation

## 2020-10-18 DIAGNOSIS — S72422D Displaced fracture of lateral condyle of left femur, subsequent encounter for closed fracture with routine healing: Secondary | ICD-10-CM | POA: Diagnosis not present

## 2020-10-18 DIAGNOSIS — I214 Non-ST elevation (NSTEMI) myocardial infarction: Secondary | ICD-10-CM | POA: Diagnosis present

## 2020-10-18 DIAGNOSIS — K567 Ileus, unspecified: Secondary | ICD-10-CM | POA: Diagnosis not present

## 2020-10-18 DIAGNOSIS — F5104 Psychophysiologic insomnia: Secondary | ICD-10-CM | POA: Diagnosis present

## 2020-10-18 DIAGNOSIS — S72492D Other fracture of lower end of left femur, subsequent encounter for closed fracture with routine healing: Principal | ICD-10-CM

## 2020-10-18 DIAGNOSIS — T148XXA Other injury of unspecified body region, initial encounter: Secondary | ICD-10-CM

## 2020-10-18 DIAGNOSIS — Z6824 Body mass index (BMI) 24.0-24.9, adult: Secondary | ICD-10-CM | POA: Diagnosis not present

## 2020-10-18 DIAGNOSIS — G4733 Obstructive sleep apnea (adult) (pediatric): Secondary | ICD-10-CM | POA: Diagnosis present

## 2020-10-18 DIAGNOSIS — W010XXD Fall on same level from slipping, tripping and stumbling without subsequent striking against object, subsequent encounter: Secondary | ICD-10-CM | POA: Diagnosis present

## 2020-10-18 DIAGNOSIS — S72432D Displaced fracture of medial condyle of left femur, subsequent encounter for closed fracture with routine healing: Secondary | ICD-10-CM

## 2020-10-18 DIAGNOSIS — D72829 Elevated white blood cell count, unspecified: Secondary | ICD-10-CM

## 2020-10-18 DIAGNOSIS — M9702XD Periprosthetic fracture around internal prosthetic left hip joint, subsequent encounter: Principal | ICD-10-CM

## 2020-10-18 DIAGNOSIS — Z79899 Other long term (current) drug therapy: Secondary | ICD-10-CM | POA: Diagnosis not present

## 2020-10-18 DIAGNOSIS — E440 Moderate protein-calorie malnutrition: Secondary | ICD-10-CM | POA: Diagnosis present

## 2020-10-18 DIAGNOSIS — Z881 Allergy status to other antibiotic agents status: Secondary | ICD-10-CM

## 2020-10-18 DIAGNOSIS — R262 Difficulty in walking, not elsewhere classified: Secondary | ICD-10-CM | POA: Diagnosis not present

## 2020-10-18 DIAGNOSIS — D62 Acute posthemorrhagic anemia: Secondary | ICD-10-CM | POA: Diagnosis present

## 2020-10-18 DIAGNOSIS — E785 Hyperlipidemia, unspecified: Secondary | ICD-10-CM | POA: Diagnosis present

## 2020-10-18 DIAGNOSIS — M9712XD Periprosthetic fracture around internal prosthetic left knee joint, subsequent encounter: Secondary | ICD-10-CM | POA: Diagnosis not present

## 2020-10-18 DIAGNOSIS — R197 Diarrhea, unspecified: Secondary | ICD-10-CM

## 2020-10-18 DIAGNOSIS — Z87891 Personal history of nicotine dependence: Secondary | ICD-10-CM | POA: Diagnosis not present

## 2020-10-18 DIAGNOSIS — S72402D Unspecified fracture of lower end of left femur, subsequent encounter for closed fracture with routine healing: Secondary | ICD-10-CM | POA: Diagnosis present

## 2020-10-18 DIAGNOSIS — I5181 Takotsubo syndrome: Secondary | ICD-10-CM

## 2020-10-18 DIAGNOSIS — N179 Acute kidney failure, unspecified: Secondary | ICD-10-CM | POA: Diagnosis present

## 2020-10-18 DIAGNOSIS — R109 Unspecified abdominal pain: Secondary | ICD-10-CM

## 2020-10-18 DIAGNOSIS — Z96653 Presence of artificial knee joint, bilateral: Secondary | ICD-10-CM | POA: Diagnosis present

## 2020-10-18 DIAGNOSIS — I10 Essential (primary) hypertension: Secondary | ICD-10-CM | POA: Diagnosis present

## 2020-10-18 DIAGNOSIS — R5381 Other malaise: Secondary | ICD-10-CM | POA: Diagnosis not present

## 2020-10-18 DIAGNOSIS — S72409S Unspecified fracture of lower end of unspecified femur, sequela: Secondary | ICD-10-CM

## 2020-10-18 LAB — CBC WITH DIFFERENTIAL/PLATELET
Abs Immature Granulocytes: 0.16 10*3/uL — ABNORMAL HIGH (ref 0.00–0.07)
Basophils Absolute: 0 10*3/uL (ref 0.0–0.1)
Basophils Relative: 0 %
Eosinophils Absolute: 0.2 10*3/uL (ref 0.0–0.5)
Eosinophils Relative: 2 %
HCT: 29.9 % — ABNORMAL LOW (ref 36.0–46.0)
Hemoglobin: 9.5 g/dL — ABNORMAL LOW (ref 12.0–15.0)
Immature Granulocytes: 1 %
Lymphocytes Relative: 31 %
Lymphs Abs: 3.5 10*3/uL (ref 0.7–4.0)
MCH: 26.5 pg (ref 26.0–34.0)
MCHC: 31.8 g/dL (ref 30.0–36.0)
MCV: 83.5 fL (ref 80.0–100.0)
Monocytes Absolute: 1 10*3/uL (ref 0.1–1.0)
Monocytes Relative: 9 %
Neutro Abs: 6.4 10*3/uL (ref 1.7–7.7)
Neutrophils Relative %: 57 %
Platelets: 411 10*3/uL — ABNORMAL HIGH (ref 150–400)
RBC: 3.58 MIL/uL — ABNORMAL LOW (ref 3.87–5.11)
RDW: 18.3 % — ABNORMAL HIGH (ref 11.5–15.5)
WBC: 11.2 10*3/uL — ABNORMAL HIGH (ref 4.0–10.5)
nRBC: 0.7 % — ABNORMAL HIGH (ref 0.0–0.2)

## 2020-10-18 LAB — COMPREHENSIVE METABOLIC PANEL
ALT: 14 U/L (ref 0–44)
AST: 20 U/L (ref 15–41)
Albumin: 2.5 g/dL — ABNORMAL LOW (ref 3.5–5.0)
Alkaline Phosphatase: 52 U/L (ref 38–126)
Anion gap: 7 (ref 5–15)
BUN: 7 mg/dL — ABNORMAL LOW (ref 8–23)
CO2: 27 mmol/L (ref 22–32)
Calcium: 9 mg/dL (ref 8.9–10.3)
Chloride: 105 mmol/L (ref 98–111)
Creatinine, Ser: 0.75 mg/dL (ref 0.44–1.00)
GFR, Estimated: >60 mL/min (ref >60)
Glucose, Bld: 101 mg/dL — ABNORMAL HIGH (ref 70–99)
Potassium: 4.5 mmol/L (ref 3.5–5.1)
Sodium: 139 mmol/L (ref 135–145)
Total Bilirubin: 0.8 mg/dL (ref 0.3–1.2)
Total Protein: 5.3 g/dL — ABNORMAL LOW (ref 6.5–8.1)

## 2020-10-18 LAB — MAGNESIUM: Magnesium: 2 mg/dL (ref 1.7–2.4)

## 2020-10-18 LAB — PHOSPHORUS: Phosphorus: 2.9 mg/dL (ref 2.5–4.6)

## 2020-10-18 MED ORDER — SPIRONOLACTONE 25 MG PO TABS: 12.5000 mg | ORAL_TABLET | Freq: Every day | ORAL | 1 refills | Status: DC

## 2020-10-18 MED ORDER — HYDROCODONE-ACETAMINOPHEN 5-325 MG PO TABS
1.0000 | ORAL_TABLET | ORAL | Status: DC | PRN
  Administered 2020-10-22 – 2020-10-28 (×13): 2 via ORAL
  Filled 2020-10-18 (×13): qty 2

## 2020-10-18 MED ORDER — METHOCARBAMOL 500 MG PO TABS
500.0000 mg | ORAL_TABLET | Freq: Four times a day (QID) | ORAL | Status: DC | PRN
  Administered 2020-10-19 – 2020-10-28 (×11): 500 mg via ORAL
  Filled 2020-10-18 (×11): qty 1

## 2020-10-18 MED ORDER — GUAIFENESIN-DM 100-10 MG/5ML PO SYRP: 5.0000 mL | ORAL_SOLUTION | Freq: Four times a day (QID) | ORAL | Status: DC | PRN

## 2020-10-18 MED ORDER — DIPHENHYDRAMINE HCL 12.5 MG/5ML PO ELIX
12.5000 mg | ORAL_SOLUTION | Freq: Four times a day (QID) | ORAL | Status: DC | PRN
  Administered 2020-10-27: 25 mg via ORAL
  Filled 2020-10-18: qty 10

## 2020-10-18 MED ORDER — SACUBITRIL-VALSARTAN 24-26 MG PO TABS: 1.0000 | ORAL_TABLET | Freq: Two times a day (BID) | ORAL | 1 refills | Status: DC

## 2020-10-18 MED ORDER — METHOCARBAMOL 1000 MG/10ML IJ SOLN
500.0000 mg | Freq: Four times a day (QID) | INTRAVENOUS | Status: DC | PRN
  Filled 2020-10-18: qty 5

## 2020-10-18 MED ORDER — ALBUTEROL SULFATE (2.5 MG/3ML) 0.083% IN NEBU: 3.0000 mL | INHALATION_SOLUTION | Freq: Four times a day (QID) | RESPIRATORY_TRACT | Status: DC | PRN

## 2020-10-18 MED ORDER — ROSUVASTATIN CALCIUM 5 MG PO TABS
10.0000 mg | ORAL_TABLET | Freq: Every day | ORAL | Status: DC
  Administered 2020-10-19 – 2020-10-28 (×10): 10 mg via ORAL
  Filled 2020-10-18 (×10): qty 2

## 2020-10-18 MED ORDER — METOPROLOL SUCCINATE ER 50 MG PO TB24
50.0000 mg | ORAL_TABLET | Freq: Every day | ORAL | Status: DC
  Administered 2020-10-19 – 2020-10-28 (×10): 50 mg via ORAL
  Filled 2020-10-18 (×10): qty 1

## 2020-10-18 MED ORDER — BUPROPION HCL ER (XL) 300 MG PO TB24
300.0000 mg | ORAL_TABLET | Freq: Every day | ORAL | Status: DC
  Administered 2020-10-19 – 2020-10-28 (×10): 300 mg via ORAL
  Filled 2020-10-18 (×10): qty 1

## 2020-10-18 MED ORDER — DOCUSATE SODIUM 100 MG PO CAPS
100.0000 mg | ORAL_CAPSULE | Freq: Two times a day (BID) | ORAL | Status: DC
  Administered 2020-10-19 – 2020-10-20 (×3): 100 mg via ORAL
  Filled 2020-10-18 (×6): qty 1

## 2020-10-18 MED ORDER — PROCHLORPERAZINE 25 MG RE SUPP: 12.5000 mg | Freq: Four times a day (QID) | RECTAL | Status: DC | PRN

## 2020-10-18 MED ORDER — CEPHALEXIN 250 MG PO CAPS
500.0000 mg | ORAL_CAPSULE | Freq: Two times a day (BID) | ORAL | Status: AC
  Administered 2020-10-18 – 2020-10-21 (×7): 500 mg via ORAL
  Filled 2020-10-18 (×8): qty 2

## 2020-10-18 MED ORDER — ACETAMINOPHEN 325 MG PO TABS
325.0000 mg | ORAL_TABLET | ORAL | Status: DC | PRN
  Administered 2020-10-26: 325 mg via ORAL
  Filled 2020-10-18: qty 1

## 2020-10-18 MED ORDER — POLYETHYLENE GLYCOL 3350 17 G PO PACK
17.0000 g | PACK | Freq: Two times a day (BID) | ORAL | Status: DC
  Administered 2020-10-19 (×2): 17 g via ORAL
  Filled 2020-10-18 (×5): qty 1

## 2020-10-18 MED ORDER — ENOXAPARIN SODIUM 40 MG/0.4ML IJ SOSY
40.0000 mg | PREFILLED_SYRINGE | INTRAMUSCULAR | Status: DC
  Administered 2020-10-19 – 2020-10-28 (×10): 40 mg via SUBCUTANEOUS
  Filled 2020-10-18 (×10): qty 0.4

## 2020-10-18 MED ORDER — PROCHLORPERAZINE MALEATE 5 MG PO TABS
5.0000 mg | ORAL_TABLET | Freq: Four times a day (QID) | ORAL | Status: DC | PRN
  Administered 2020-10-26 – 2020-10-27 (×2): 10 mg via ORAL
  Filled 2020-10-18 (×2): qty 2

## 2020-10-18 MED ORDER — ASPIRIN EC 81 MG PO TBEC
81.0000 mg | DELAYED_RELEASE_TABLET | Freq: Every day | ORAL | Status: DC
  Administered 2020-10-19 – 2020-10-28 (×10): 81 mg via ORAL
  Filled 2020-10-18 (×10): qty 1

## 2020-10-18 MED ORDER — CEPHALEXIN 500 MG PO CAPS: 500.0000 mg | ORAL_CAPSULE | Freq: Two times a day (BID) | ORAL | 0 refills | Status: DC

## 2020-10-18 MED ORDER — PROCHLORPERAZINE EDISYLATE 10 MG/2ML IJ SOLN: 5.0000 mg | Freq: Four times a day (QID) | INTRAMUSCULAR | Status: DC | PRN

## 2020-10-18 MED ORDER — ALUM & MAG HYDROXIDE-SIMETH 200-200-20 MG/5ML PO SUSP
30.0000 mL | ORAL | Status: DC | PRN
  Administered 2020-10-23 – 2020-10-26 (×5): 30 mL via ORAL
  Filled 2020-10-18 (×6): qty 30

## 2020-10-18 MED ORDER — SPIRONOLACTONE 12.5 MG HALF TABLET
12.5000 mg | ORAL_TABLET | Freq: Every day | ORAL | Status: DC
  Administered 2020-10-19 – 2020-10-28 (×10): 12.5 mg via ORAL
  Filled 2020-10-18 (×10): qty 1

## 2020-10-18 MED ORDER — BISACODYL 10 MG RE SUPP: 10.0000 mg | Freq: Every day | RECTAL | Status: DC | PRN

## 2020-10-18 MED ORDER — GABAPENTIN 300 MG PO CAPS
300.0000 mg | ORAL_CAPSULE | Freq: Every day | ORAL | Status: DC
  Administered 2020-10-19 – 2020-10-28 (×10): 300 mg via ORAL
  Filled 2020-10-18 (×10): qty 1

## 2020-10-18 MED ORDER — JUVEN PO PACK
1.0000 | PACK | Freq: Two times a day (BID) | ORAL | Status: DC
  Administered 2020-10-19 – 2020-10-22 (×3): 1 via ORAL
  Filled 2020-10-18 (×3): qty 1

## 2020-10-18 MED ORDER — METRONIDAZOLE 500 MG PO TABS
500.0000 mg | ORAL_TABLET | Freq: Three times a day (TID) | ORAL | Status: AC
  Administered 2020-10-18 – 2020-10-21 (×10): 500 mg via ORAL
  Filled 2020-10-18 (×10): qty 1

## 2020-10-18 MED ORDER — POLYETHYLENE GLYCOL 3350 17 G PO PACK: 17.0000 g | PACK | Freq: Every day | ORAL | Status: DC | PRN

## 2020-10-18 MED ORDER — METRONIDAZOLE 500 MG PO TABS: 500.0000 mg | ORAL_TABLET | Freq: Three times a day (TID) | ORAL | 0 refills | Status: DC

## 2020-10-18 MED ORDER — SACUBITRIL-VALSARTAN 24-26 MG PO TABS
1.0000 | ORAL_TABLET | Freq: Two times a day (BID) | ORAL | Status: DC
  Administered 2020-10-18 – 2020-10-28 (×20): 1 via ORAL
  Filled 2020-10-18 (×21): qty 1

## 2020-10-18 MED ORDER — TRAZODONE HCL 50 MG PO TABS
25.0000 mg | ORAL_TABLET | Freq: Every evening | ORAL | Status: DC | PRN
  Administered 2020-10-18 – 2020-10-27 (×10): 25 mg via ORAL
  Filled 2020-10-18 (×10): qty 1

## 2020-10-18 MED ORDER — GABAPENTIN 300 MG PO CAPS
600.0000 mg | ORAL_CAPSULE | Freq: Every day | ORAL | Status: DC
  Administered 2020-10-18 – 2020-10-27 (×10): 600 mg via ORAL
  Filled 2020-10-18 (×10): qty 2

## 2020-10-18 MED ORDER — ASPIRIN 81 MG PO TBEC: 81.0000 mg | DELAYED_RELEASE_TABLET | Freq: Every day | ORAL | 1 refills | Status: AC

## 2020-10-18 MED ORDER — ADULT MULTIVITAMIN W/MINERALS CH
1.0000 | ORAL_TABLET | Freq: Every day | ORAL | Status: DC
  Administered 2020-10-19 – 2020-10-28 (×10): 1 via ORAL
  Filled 2020-10-18 (×10): qty 1

## 2020-10-18 MED ORDER — OXYCODONE HCL 5 MG PO TABS
5.0000 mg | ORAL_TABLET | ORAL | Status: DC | PRN
  Administered 2020-10-18 – 2020-10-28 (×10): 5 mg via ORAL
  Filled 2020-10-18 (×11): qty 1

## 2020-10-18 MED ORDER — METOPROLOL SUCCINATE ER 50 MG PO TB24: 50.0000 mg | ORAL_TABLET | Freq: Every day | ORAL | 1 refills | Status: DC

## 2020-10-18 MED ORDER — FLEET ENEMA 7-19 GM/118ML RE ENEM: 1.0000 | ENEMA | Freq: Once | RECTAL | Status: DC | PRN

## 2020-10-18 MED ORDER — ENSURE ENLIVE PO LIQD
237.0000 mL | Freq: Three times a day (TID) | ORAL | Status: DC
  Administered 2020-10-18 – 2020-10-28 (×21): 237 mL via ORAL

## 2020-10-18 NOTE — Progress Notes (Signed)
RT note. Patient placed on auto cpap 16/5. Patient resting comfortable. RT will continue to monitor.

## 2020-10-18 NOTE — Progress Notes (Signed)
Inpatient Rehabilitation Admissions Coordinator   I have insurance approval and [facility] bed to admit her to today. I will make the arrangements. Dr Lowell Guitar , acute team and [facility] made aware.  Ottie Glazier, RN, MSN Rehab Admissions Coordinator [phone] 10/18/2020 4:16 PM
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
## 2020-10-18 NOTE — Progress Notes (Signed)
75 y.o. female inpatient. History of HTN, HLD. Presented with to the ED at [facility] with weakness, nausea, vomiting and injuries related to a fall while walking. Patient found to have a left femur fracture, and NSTEMI thought to be secondary to stress. Hospital stay further complicated by RUQ pain and fevers. Found to have acute calculus cholecystitis.  IR placed a cholecystomy tube on 8.24.22. Patient reporting RUQ intra abdominal pain. Denies any nausea, vomiting. Pain is described an intermittent. Condition is improved with pain medication that "helps to ease the pain". Cholecystomy tube placed to gravity bag. Site is unremarkable with no erythema, edema, tenderness, bleeding or drainage noted at exit site. Suture and stat lock in place. Dressing is clean dry and intact. 50 ml of  dark brown colored fluid noted in gravity bag. Drain is able to be flushed and aspirated easily with no increase in pain. Per Epic output has been 200 ml<< 125 ml<< 240 ml. WBC 11.2 KUB pending.   IR to continue to follow.

## 2020-10-18 NOTE — Treatment Plan (Signed)
Patient is medically ready for d/c to CIR
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
## 2020-10-18 NOTE — Progress Notes (Signed)
Patient arrived at 55. States she has to use the bed pan. States she would like pain medications when available. Patient was provided the bed pan. Sister at bedside. Call light within reach. Personal items within reach. Admission will be endorsed to [place] nurse

## 2020-10-18 NOTE — Progress Notes (Signed)
Inpatient Rehabilitation Admissions Coordinator   I await insurance approval to admit to CIR today. I spoke with patient at bedside and spoke with daughter by phone. We reviewed cost of care for CIR. They are in agreement pending approval by [facility].  Ottie Glazier, RN, MSN Rehab Admissions Coordinator [phone] 10/18/2020 10:57 AM
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
## 2020-10-18 NOTE — Progress Notes (Signed)
Pt off of unit for abdominal xray.

## 2020-10-18 NOTE — Discharge Summary (Signed)
Physician Discharge Summary  Heather [name] VOJ:[id number] DOB: September 21, 1945 DOA: 10/09/2020  PCP: Heather Canterbury, MD  Admit date: 10/09/2020 Discharge date: 10/18/2020  Time spent: 40 minutes  Recommendations for Outpatient/CIR Follow-up:  Follow outpatient CBC/CMP Complaining of RUQ pain, follow KUB (pending) - appreciate IR recommendations Follow with orthopedics 2 weeks after discharge Needs follow up with general surgery in 6-8 weeks to discuss interval lap chole - needs outpatient follow up with IR as well Started on entresto, spironolactone, metoprolol, aspirin, crestor by cards - will need to follow up with cardiology within 2 weeks of discharge Complete abx for cholecystitis Partial weight bearing 50% per ortho  Discharge Diagnoses:  Active Problems:   Essential hypertension   Hypotension   Femur fracture (HCC)   Prolonged QT interval   Abnormal ECG   AKI (acute kidney injury) (HCC)   Hyperkalemia   Elevated troponin   Leukocytosis   Malnutrition of moderate degree   Discharge Condition: stable  Diet recommendation: heart healthy  Filed Weights   10/10/20 2000  Weight: 58.2 kg    History of present illness:  Heather [name] 75 y.o. female with medical history significant for depression, hyperlipidemia, hypertension, asthma, L3 burst fracture s/p repair, who was brought to the hospital after Heather [name] fall at home.   She was diagnosed with Heather [name] distal femoral fracture.  She was also found to have AKI, hyperkalemia, elevated troponins concerning for NSTEMI.  She was treated with IV fluids and analgesics.  She underwent left heart cath which showed minimal nonobstructive CAD.     She developed fever and tachycardia overnight 8/23-24.  She was started on broad spectrum abx and treated for sepsis.  RUQ US showed cholecystitis.  Surgery was c/s and she's now s/p perc chole drain which was followed by ORIF of L periprosthetic distal femur fracture.    Currently  with some RUQ pain today, awaiting KUB and IR planning to follow.  Discharge to CIR today for rehab.  See below for additional recs   Hospital Course:  Dysura Follow UA and culture - not suggestive of UTI   Sepsis 2/2 Acute Cholecystitis  RUQ Pain C/o RUQ pain today, discussed with IR recommending KUB - IR following Fever, tachycardia, tachypnea - meets criteria for sepsis RUQ [place] with acute cholecystitis CXR with cardiomegaly with vascular congestion She's now s/p perc chole drain by IR 8/24 Appreciate surgery recs - defer lap chole at this time, perc chole by IR -> planning for drain placement with interval lap chole in 6-8 weeks.  Continue abx -> transition to PO for additional 7 days (keflex/flagyl (discussed with pharm) last day 9/2). Appreciate IR recs as well (8/26 note) Repeat blood cultures no growth  UA on admission not concerning for UTI.  1/2 sets of blood cultures from admission with coag negative staph, suspect contaminant.   Left Distal Femur Fracture S/p ORIF of L periprosthetic distal femur fracture 50% partial weightbearing on LLE Lovenox for DVT ppx PT/OT Pain management, bowel regimen Can shower on 8/28 with assistance if no drainage from incisions (see ortho note 8/27) Follow outpatient with orthopedics 2 weeks after discharge   NSTEMI S/p LHC with nonobstructive CAD Echo showing mild mid apical anterolateral and posterolateral hypokinesis, EF 50-55%, grade II diastolic dysfunction, mildly elevated PASP Cardiology has now signed off -> recommending ASA, toprol 50 mg, crestor, entresto, spiro (see 8/25 note) - 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   Prox RCA to Mid RCA lesion is 20% stenosed.   LV end diastolic pressure is normal.    Minimal nonobstructive CAD Normal LVEDP   Plan: medical management.  Echo IMPRESSIONS     1. Mild mid-apical anterolateral and posterolateral hypokinesis. Left  ventricular ejection fraction, by estimation, is 50 to 55%. The left  ventricle has low normal function. The left ventricle demonstrates  regional wall motion abnormalities (see scoring   diagram/findings for description). There is mild concentric left  ventricular hypertrophy. Left ventricular diastolic parameters are  consistent with Grade II diastolic dysfunction (pseudonormalization).  Elevated left ventricular end-diastolic pressure.   2. Right ventricular systolic function is normal. The right ventricular  size is normal. There is mildly elevated pulmonary artery systolic  pressure.   3. Left atrial size was mildly dilated.   4. The mitral valve is normal in structure. No evidence of mitral valve  regurgitation. No evidence of mitral stenosis.   5. The aortic valve is tricuspid. Aortic valve regurgitation is not  visualized. No aortic stenosis is present.   6. The inferior vena cava is normal in size with greater than 50%  respiratory variability, suggesting right atrial pressure of 3 mmHg.   8/25 Open reduction internal fixation of left periprosthetic distal femur fracture  8/24 IMPRESSION: Successful placement of Heather [name] 10.2 French percutaneous, subhepatic/transperitoneal cholecystostomy tube.      Consultations: Ortho Cardiology IR General surgery  Discharge Exam: Vitals:   10/18/20 0747 10/18/20 0940  BP: 137/78 (!) 144/92  Pulse: 83 85  Resp: 18   Temp: 99 F (37.2 C)   SpO2:     C/o RUQ pain  General: No acute distress. Cardiovascular: Heart sounds show Heather [name] regular rate, and rhythm.  Lungs: Clear to auscultation bilaterally  Abdomen: mild RUQ pain around sight of perc tube insertion  Neurological: Alert and oriented 3. Moves all extremities 4 . Cranial nerves II through XII grossly  intact. Skin: Warm and dry. No rashes or lesions. Extremities: no lee   Discharge Instructions   Discharge Instructions     Call MD for:  difficulty breathing, headache or visual disturbances   Complete by: As directed    Call MD for:  extreme fatigue   Complete by: As directed    Call MD for:  hives   Complete by: As directed    Call MD for:  persistant dizziness or light-headedness   Complete by: As directed    Call MD for:  persistant nausea and vomiting   Complete by: As directed    Call MD for:  redness, tenderness, or signs of infection (pain, swelling, redness, odor or green/yellow discharge around incision site)   Complete by: As directed    Call MD for:  severe uncontrolled pain   Complete by: As directed    Call MD for:  temperature >100.4   Complete by: As directed    Diet - low sodium heart healthy   Complete by: As directed    Discharge instructions   Complete by: As directed    You were seen for Gevorg Brum left distal periprosthetic femur fracture.  This was repaired by orthopedics.  Your weightbearing 50% at this time.  Follow up with orthopedics.   Your hospitalization was complicated by chest pain.  You had Avigayil Ton catheterization which showed  minimal nonobstructive CAD.  Your echo showed abnormal motion and you had elevated heart enzymes, which maybe related to the stress of your infection.    You've been started on entresto, spironolactone, metoprolol, aspirin, and crestor by cardiology.  Continue these medicines and follow up with cardiology as an outpatient.  You had an inflamed gallbladder that required Cambreigh Dearing tube for drainage.  This will need to be followed by interventional radiology outpatient and you'll eventually need evaluation for surgery in 6-8 weeks or so.    We're discharging you with antibiotics.  Interventional radiology will follow up your right upper quadrant pain to help [place] decide if any new workup is needed.  We'll discharge you to inpatient rehab.  You  should follow up with orthopedics within 2 weeks for discharge.    Return for new, recurrent, or worsening issues.  Please ask your PCP to request records from this hospitalization so they know what was done and what the next steps will be.   Discharge wound care:   Complete by: As directed    Per ortho   Increase activity slowly   Complete by: As directed    Partial weight bearing   Complete by: As directed    % Body Weight: 50   Laterality: left   Extremity: Lower      Allergies as of 10/18/2020       Reactions   Augmentin [amoxicillin-pot Clavulanate]    Has tolerated Ancef        Medication List     STOP taking these medications    celecoxib 200 MG capsule Commonly known as: CELEBREX   valsartan 80 MG tablet Commonly known as: Diovan       TAKE these medications    albuterol 108 (90 Base) MCG/ACT inhaler Commonly known as: VENTOLIN HFA Inhale 2 puffs into the lungs every 6 (six) hours as needed for wheezing or shortness of breath.   aspirin 81 MG EC tablet Take 1 tablet (81 mg total) by mouth daily. Swallow whole. Start taking on: October 19, 2020   buPROPion 300 MG 24 hr tablet Commonly known as: WELLBUTRIN XL Take 300 mg by mouth daily.   cephALEXin 500 MG capsule Commonly known as: KEFLEX Take 1 capsule (500 mg total) by mouth every 12 (twelve) hours for 4 days.   dicyclomine 20 MG tablet Commonly known as: BENTYL Take 20 mg by mouth every 6 (six) hours as needed for spasms.   fluticasone 50 MCG/ACT nasal spray Commonly known as: FLONASE Place 1 spray into both nostrils daily.   gabapentin 300 MG capsule Commonly known as: NEURONTIN Take 300-600 mg by mouth 2 (two) times daily. Take 1 capsule (300 mg) in the morning & Take 2 capsules (600 mg) at bedtime   HYDROcodone-acetaminophen 5-325 MG tablet Commonly known as: NORCO/VICODIN Take 1-2 tablets by mouth every 4 (four) hours as needed for severe pain ((score 7 to 10)).   methocarbamol  500 MG tablet Commonly known as: ROBAXIN Take 500 mg by mouth every 12 (twelve) hours as needed for muscle spasms.   metoprolol succinate 50 MG 24 hr tablet Commonly known as: TOPROL-XL Take 1 tablet (50 mg total) by mouth daily. Take with or immediately following Shevawn Langenberg meal. Start taking on: October 19, 2020   metroNIDAZOLE 500 MG tablet Commonly known as: FLAGYL Take 1 tablet (500 mg total) by mouth every 8 (eight) hours for 4 days.   montelukast 10 MG tablet Commonly known as: SINGULAIR Take 10 mg by  mouth at bedtime.   omeprazole 40 MG capsule Commonly known as: PRILOSEC Take 1 capsule (40 mg total) by mouth daily.   ondansetron 4 MG disintegrating tablet Commonly known as: ZOFRAN-ODT Take 4 mg by mouth every 8 (eight) hours as needed for nausea or vomiting.   rosuvastatin 10 MG tablet Commonly known as: CRESTOR Take 10 mg by mouth daily.   sacubitril-valsartan 24-26 MG Commonly known as: ENTRESTO Take 1 tablet by mouth 2 (two) times daily.   spironolactone 25 MG tablet Commonly known as: ALDACTONE Take 0.5 tablets (12.5 mg total) by mouth daily. Start taking on: October 19, 2020   tacrolimus 0.03 % ointment Commonly known as: PROTOPIC Apply 1 application topically 2 (two) times daily.   traZODone 150 MG tablet Commonly known as: DESYREL Take 150 mg by mouth at bedtime.               Discharge Care Instructions  (From admission, onward)           Start     Ordered   10/18/20 0000  Partial weight bearing       Question Answer Comment  % Body Weight 50   Laterality left   Extremity Lower      10/18/20 1732   10/18/20 0000  Discharge wound care:       Comments: Per ortho   10/18/20 1732           Allergies  Allergen Reactions   Augmentin [Amoxicillin-Pot Clavulanate]     Has tolerated Ancef    Follow-up Information     [facility] Follow up.   Specialty: Cardiology Why: [facility]  (Cardiology) - Please come to  our [address] LOCATION on 10/20/20 for labwork between 8am-4pm (disregard appointment time listed below on lab - you can come during the day at your convenience). This is for Dreonna Hussein basic metabolic panel. You do not need to be fasting. Contact information: [address], [address] [place] [address] [phone]        [facility] Follow up.   Specialty: Cardiology Why: [facility] (Cardiology) - You will come to our [facility] LOCATION for Blinda Turek follow-up appointment with Edd Fabian, Nurse practitioner, on 10/27/20 at 2:45 PM (Arrive by 2:30 PM). Contact information: [address] [place] [address] [phone]        Harriette Bouillon, MD Follow up in 6 week(s).   Specialty: General Surgery Why: we will set up Contact information: [address] [place] [address] [phone]                  The results of significant diagnostics from this hospitalization (including imaging, microbiology, ancillary and laboratory) are listed below for reference.    Significant Diagnostic Studies: NM Pulmonary Perfusion  Result Date: 10/10/2020 CLINICAL DATA:  Pulmonary emboli suspected, high probability. Shortness of breath over the last 3 days. EXAM: NUCLEAR MEDICINE PERFUSION LUNG SCAN TECHNIQUE: Perfusion images were obtained in multiple projections after intravenous injection of radiopharmaceutical. Ventilation scans intentionally deferred if perfusion scan and chest x-ray adequate for interpretation during COVID 19 epidemic. RADIOPHARMACEUTICALS:  4.4 mCi Tc-73m MAA IV COMPARISON:  Chest radiography yesterday. FINDINGS: Negative perfusion study.  No evidence of focal defect. IMPRESSION: Negative/normal nuclear medicine pulmonary perfusion study. No visible defect. Electronically Signed   By: Paulina Fusi M.D.   On: 10/10/2020 11:43   CARDIAC CATHETERIZATION  Result Date: 10/11/2020  Prox LAD lesion  is 30% stenosed.   Prox RCA to Mid RCA lesion is 20% stenosed.   LV end diastolic pressure is normal. Minimal nonobstructive CAD Normal LVEDP Plan: medical management.   IR Perc Cholecystostomy  Result Date: 10/12/2020 INDICATION: 75 year old female with acute calculus cholecystitis, poor surgical candidate. EXAM: Percutaneous cholecystostomy tube placement MEDICATIONS: 2 g cefoxitin, intravenous; The antibiotic was administered within an appropriate time frame prior to the initiation of the procedure. ANESTHESIA/SEDATION: Moderate (conscious) sedation was employed during this procedure. Chanika Byland total of Versed 2.5 mg and Fentanyl 100 mcg was administered intravenously. Moderate Sedation Time: 11 minutes. The patient's level of consciousness and vital signs were monitored continuously by radiology nursing throughout the procedure under my direct supervision. FLUOROSCOPY TIME:  Fluoroscopy Time: 0 minutes 36 seconds (12.6 mGy). COMPLICATIONS: None immediate. PROCEDURE: Informed written consent was obtained from the patient after Chene Kasinger thorough discussion of the procedural risks, benefits and alternatives. All questions were addressed. Maximal Sterile Barrier Technique was utilized including caps, mask, sterile gowns, sterile gloves, sterile drape, hand hygiene and skin antiseptic. Ashrith Sagan timeout was performed prior to the initiation of the procedure. The patient was placed supine on the angiographic table. The patient's right upper quadrant was then prepped and draped in normal sterile fashion with maximum sterile barrier. Ultrasound demonstrates Markeise Mathews distended gallbladder with echogenic gallstones in the fundus. Subdermal Local anesthesia was provided at the planned skin entry site. Under ultrasound guidance, deeper local anesthetic was provided through anterior abdominal muscles. Ultrasound was used to puncture the gallbladder using Rhetta Cleek 21 gauge Chiba needle via Lecretia Buczek subhepatic approach with visualization of the lung treated to the  gallbladder. Shakema Surita 0.018 inch wire was advanced into the lumen and Makaiah Terwilliger transition dilator placed. Lauraine Crespo gentle hand injection of contrast was performed. Cholecystogram demonstrates irregular filling defects consistent with gallstones. The cystic duct is patent. Haidar Muse 0.035 inch exchange wire was placed in the tract was dilated. Sava Proby 10.2 French multipurpose drainage catheter was advanced into the gallbladder lumen. The drain was then secured in place using Jonnell Hentges 0-silk suture and Skye Rodarte Stayfix device. Melek Pownall sterile dressing was applied. The tube was placed to bag drainage. The patient tolerated procedure well without evidence of immediate complication was transferred back to the floor in stable condition. IMPRESSION: Successful placement of Tequan Redmon 10.2 French percutaneous, subhepatic/transperitoneal cholecystostomy tube. Marliss Coots, MD [facility] Electronically Signed   By: Marliss Coots M.D.   On: 10/12/2020 15:21   DG Chest Port 1 View  Result Date: 10/12/2020 CLINICAL DATA:  Sepsis. EXAM: PORTABLE CHEST 1 VIEW COMPARISON:  Chest radiograph dated 10/09/2020. FINDINGS: There is cardiomegaly with vascular congestion. No focal consolidation, pleural effusion or pneumothorax. Degenerative changes of the spine. No acute osseous pathology. Partially visualized right shoulder arthroplasty. IMPRESSION: Cardiomegaly with vascular congestion. No focal consolidation. Electronically Signed   By: Elgie Collard M.D.   On: 10/12/2020 01:33   DG Chest Port 1 View  Result Date: 10/09/2020 CLINICAL DATA:  Weakness.  Patient fell x2 today. EXAM: PORTABLE CHEST 1 VIEW COMPARISON:  12/29/2019 FINDINGS: Heart size is accentuated by AP portable technique. The lungs are clear. No pneumothorax. No consolidation or pulmonary edema. Remote RIGHT shoulder arthroplasty. IMPRESSION: No evidence for acute  abnormality. Electronically Signed   By: Norva Pavlov M.D.   On: 10/09/2020 19:39   DG Knee  Left Port  Result Date: 10/09/2020 CLINICAL DATA:  Fall, left knee pain EXAM: PORTABLE LEFT KNEE - 1-2 VIEW COMPARISON:  None. FINDINGS: Two view radiograph left knee demonstrates Roy Snuffer Keidan Aumiller comminuted fracture of the distal left femoral diaphysis extending to the level of the femoral arthroplasty component with prominent medial and posterior butterfly components. There is mild medial and moderate posterior angulation of the distal fracture fragment. 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 Arthroplasty components are in expected alignment. Small left knee effusion is present. IMPRESSION: Comminuted, angulated fracture of the distal left femur just above the femoral arthroplasty component. Electronically Signed   By: Helyn Numbers M.D.   On: 10/09/2020 19:04   DG C-Arm 1-60 Min-No Report  Result Date: 10/13/2020 Fluoroscopy was utilized by the requesting physician.  No radiographic interpretation.   ECHOCARDIOGRAM COMPLETE  Result Date: 10/10/2020    ECHOCARDIOGRAM REPORT   Patient Name:   MICKALA LATON Date of Exam: 10/10/2020 Medical Rec #:  [id number]              Height:       63.0 in Accession #:    [id number]             Weight:       188.5 lb Date of Birth:  08/10/1945             BSA:          1.886 m Patient Age:    74 years               BP:           140/71 mmHg Patient Gender: F                      HR:           93 bpm. Exam Location:  Inpatient Procedure: 2D Echo Indications:    elevated troponin  History:        Patient has no prior history of Echocardiogram examinations.                 Signs/Symptoms:elevated troponin; Risk Factors:Dyslipidemia and                 Hypertension.  Sonographer:    Delcie Roch RDCS Referring Phys: [id number] ANASTASSIA DOUTOVA IMPRESSIONS  1. Mild mid-apical anterolateral and posterolateral hypokinesis. Left ventricular ejection fraction, by estimation, is 50 to 55%. The left ventricle has low normal function. The left ventricle demonstrates regional  wall motion abnormalities (see scoring  diagram/findings for description). There is mild concentric left ventricular hypertrophy. Left ventricular diastolic parameters are consistent with Grade II diastolic dysfunction (pseudonormalization). Elevated left ventricular end-diastolic pressure.  2. Right ventricular systolic function is normal. The right ventricular size is normal. There is mildly elevated pulmonary artery systolic pressure.  3. Left atrial size was mildly dilated.  4. The mitral valve is normal in structure. No evidence of mitral valve regurgitation. No evidence of mitral stenosis.  5. The aortic valve is tricuspid. Aortic valve regurgitation is not visualized. No aortic stenosis is present.  6. The inferior vena cava is normal in size with greater than 50% respiratory variability, suggesting right atrial pressure of 3 mmHg. FINDINGS  Left Ventricle: Mild mid-apical anterolateral and posterolateral hypokinesis. Left ventricular ejection fraction, by estimation, is 50 to 55%. The left ventricle has low normal function. The left ventricle demonstrates regional wall motion abnormalities. The left ventricular internal cavity size was normal in size. There is mild concentric left ventricular hypertrophy. Left ventricular diastolic parameters are consistent with Grade II diastolic dysfunction (pseudonormalization). Elevated left  ventricular end-diastolic pressure. Right Ventricle: The right ventricular size is normal. No increase in right ventricular wall thickness. Right ventricular systolic function is normal. There is mildly elevated pulmonary artery systolic pressure. The tricuspid regurgitant velocity is 3.19  m/s, and with an assumed right atrial pressure of 3 mmHg, the estimated right ventricular systolic pressure is 43.7 mmHg. Left Atrium: Left atrial size was mildly dilated. Right Atrium: Right atrial size was normal in size. Pericardium: There is no evidence of pericardial effusion. Mitral Valve:  The mitral valve is normal in structure. No evidence of mitral valve regurgitation. No evidence of mitral valve stenosis. Tricuspid Valve: The tricuspid valve is normal in structure. Tricuspid valve regurgitation is trivial. No evidence of tricuspid stenosis. Aortic Valve: The aortic valve is tricuspid. Aortic valve regurgitation is not visualized. No aortic stenosis is present. Pulmonic Valve: The pulmonic valve was normal in structure. Pulmonic valve regurgitation is mild. No evidence of pulmonic stenosis. Aorta: The aortic root is normal in size and structure. Venous: The inferior vena cava is normal in size with greater than 50% respiratory variability, suggesting right atrial pressure of 3 mmHg. IAS/Shunts: No atrial level shunt detected by color flow Doppler.  LEFT VENTRICLE PLAX 2D LVIDd:         4.10 cm     Diastology LVIDs:         2.40 cm     LV e' medial:    6.53 cm/s LV PW:         1.20 cm     LV E/e' medial:  14.1 LV IVS:        1.06 cm     LV e' lateral:   9.36 cm/s LVOT diam:     1.80 cm     LV E/e' lateral: 9.8 LV SV:         43 LV SV Index:   23 LVOT Area:     2.54 cm  LV Volumes (MOD) LV vol d, MOD A4C: 71.5 ml LV vol s, MOD A4C: 35.7 ml LV SV MOD A4C:     71.5 ml RIGHT VENTRICLE             IVC RV S prime:     11.20 cm/s  IVC diam: 1.70 cm TAPSE (M-mode): 2.0 cm LEFT ATRIUM             Index       RIGHT ATRIUM           Index LA diam:        4.00 cm 2.12 cm/m  RA Area:     11.90 cm LA Vol (A2C):   75.3 ml 39.94 ml/m RA Volume:   27.30 ml  14.48 ml/m LA Vol (A4C):   45.0 ml 23.87 ml/m LA Biplane Vol: 59.3 ml 31.45 ml/m  AORTIC VALVE LVOT Vmax:   108.00 cm/s LVOT Vmean:  69.500 cm/s LVOT VTI:    0.170 m  AORTA Ao Root diam: 2.80 cm Ao Asc diam:  3.10 cm MITRAL VALVE               TRICUSPID VALVE MV Area (PHT): 4.80 cm    TR Peak grad:   40.7 mmHg MV Decel Time: 158 msec    TR Vmax:        319.00 cm/s MV E velocity: 92.10 cm/s MV Ovadia Lopp velocity: 84.80 cm/s  SHUNTS MV E/Azula Zappia ratio:  1.09         Systemic VTI:  0.17 m  Systemic Diam: 1.80 cm Chilton Si MD Electronically signed by Chilton Si MD Signature Date/Time: 10/10/2020/6:46:32 PM    Final    DG FEMUR MIN 2 VIEWS LEFT  Result Date: 10/13/2020 CLINICAL DATA:  Elective surgery. EXAM: LEFT FEMUR 2 VIEWS COMPARISON:  Preoperative imaging. FINDINGS: Six fluoroscopic spot views of the left femur obtained in the operating room. Lateral plate and multi screw fixation of distal femur fracture. 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 Total fluoroscopy time 1 minutes 5 seconds. Dose 5.98 mGy. IMPRESSION: Intraoperative fluoroscopy for distal femur fracture ORIF. Electronically Signed   By: Narda Rutherford M.D.   On: 10/13/2020 12:39   DG FEMUR PORT MIN 2 VIEWS LEFT  Result Date: 10/13/2020 CLINICAL DATA:  Postop left femur fracture repair. EXAM: LEFT FEMUR PORTABLE 2 VIEWS COMPARISON:  Preoperative imaging. FINDINGS: Lateral plate and multi screw fixation of comminuted distal femur fracture. Fracture is in improved alignment from preoperative imaging. 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 Recent postsurgical change includes air and edema in the soft tissues. IMPRESSION: ORIF of comminuted distal femur fracture, in improved alignment from preoperative imaging. Electronically Signed   By: Narda Rutherford M.D.   On: 10/13/2020 12:40   US Abdomen Limited RUQ (LIVER/GB)  Result Date: 10/12/2020 CLINICAL DATA:  Right upper quadrant abdominal pain EXAM: ULTRASOUND ABDOMEN LIMITED RIGHT UPPER QUADRANT COMPARISON:  None. FINDINGS: Gallbladder: Multiple large small bowel shadowing stones are present. The largest measures up to 2.3 mm. Gallbladder wall is thickened, measuring up to 4.8 mm. No sonographic Eulah Pont sign is reported. Common bile duct: Diameter: 4.0 mm, within normal limits Liver: No focal lesion identified. Within normal limits in parenchymal echogenicity. Portal vein is patent on color Doppler imaging with  normal direction of blood flow towards the liver. Other: None. IMPRESSION: 1. Cholelithiasis with gallbladder wall thickening suggesting acute cholecystitis. 2. No sonographic Eulah Pont sign is reported. Electronically Signed   By: Marin Roberts M.D.   On: 10/12/2020 07:45    Microbiology: Recent Results (from the past 240 hour(s))  Resp Panel by RT-PCR (Flu Julieanna Geraci&B, Covid) Nasopharyngeal Swab     Status: None   Collection Time: 10/09/20  6:41 PM   Specimen: Nasopharyngeal Swab; Nasopharyngeal(NP) swabs in vial transport medium  Result Value Ref Range Status   SARS Coronavirus 2 by RT PCR NEGATIVE NEGATIVE Final    Comment: (NOTE) SARS-CoV-2 target nucleic acids are NOT DETECTED.  The SARS-CoV-2 RNA is generally detectable in upper respiratory specimens during the acute phase of infection. The lowest concentration of SARS-CoV-2 viral copies this assay can detect is 138 copies/mL. Soniya Ashraf negative result does not preclude SARS-Cov-2 infection and should not be used as the sole basis for treatment or other patient management decisions. Kayler Rise negative result may occur with  improper specimen collection/handling, submission of specimen other than nasopharyngeal swab, presence of viral mutation(s) within the areas targeted by this assay, and inadequate number of viral copies(<138 copies/mL). Calena Salem negative result must be combined with clinical observations, patient history, and epidemiological information. The expected result is Negative.  Fact Sheet for Patients:  BloggerCourse.com  Fact Sheet for Healthcare Providers:  SeriousBroker.it  This test is no t yet approved or cleared by the [place] FDA and  has been authorized for detection and/or diagnosis of SARS-CoV-2 by FDA under an Emergency Use Authorization (EUA). This EUA will remain  in effect (meaning this test can be used) for the duration of the COVID-19 declaration under Section  564(b)(1) of the Act, 21 U.S.C.section 360bbb-3(b)(1),  unless the authorization is terminated  or revoked sooner.       Influenza Curtistine Pettitt by PCR NEGATIVE NEGATIVE Final   Influenza B by PCR NEGATIVE NEGATIVE Final    Comment: (NOTE) The Xpert Xpress SARS-CoV-2/FLU/RSV plus assay is intended as an aid in the diagnosis of influenza from Nasopharyngeal swab specimens and should not be used as Raenell Mensing sole basis for treatment. Nasal washings and aspirates are unacceptable for Xpert Xpress SARS-CoV-2/FLU/RSV testing.  Fact Sheet for Patients: BloggerCourse.com  Fact Sheet for Healthcare Providers: SeriousBroker.it  This test is not yet approved or cleared by the [place] FDA and has been authorized for detection and/or diagnosis of SARS-CoV-2 by FDA under an Emergency Use Authorization (EUA). This EUA will remain in effect (meaning this test can be used) for the duration of the COVID-19 declaration under Section 564(b)(1) of the Act, 21 U.S.C. section 360bbb-3(b)(1), unless the authorization is terminated or revoked.  Performed at [facility], [address], [place], [place] [address]   Culture, blood (routine x 2)     Status: None   Collection Time: 10/09/20  7:55 PM   Specimen: BLOOD  Result Value Ref Range Status   Specimen Description   Final    BLOOD RIGHT ANTECUBITAL Performed at [facility], [address], [place], [place] [address]    Special Requests   Final    BOTTLES DRAWN AEROBIC AND ANAEROBIC Blood Culture results may not be optimal due to an inadequate volume of blood received in culture bottles Performed at [facility], [address] [place], [place] [address]    Culture   Final    NO GROWTH 5 DAYS Performed at [facility], [address], [place], [place] [address]    Report Status 10/15/2020 FINAL  Final  Culture, blood (routine x 2)     Status:  Abnormal   Collection Time: 10/09/20  7:55 PM   Specimen: BLOOD  Result Value Ref Range Status   Specimen Description   Final    BLOOD LEFT ANTECUBITAL Performed at [facility], [address], [place], [place] [address]    Special Requests   Final    BOTTLES DRAWN AEROBIC AND ANAEROBIC Blood Culture results may not be optimal due to an inadequate volume of blood received in culture bottles Performed at [facility], [address] [place], [place] [address]    Culture  Setup Time   Final    GRAM POSITIVE COCCI IN CLUSTERS IN BOTH AEROBIC AND ANAEROBIC BOTTLES CRITICAL RESULT CALLED TO, READ BACK BY AND VERIFIED WITH: L SEAY,PHARMD@2330  10/10/20 MK    Culture (Tacuma Graffam)  Final    STAPHYLOCOCCUS EPIDERMIDIS STAPHYLOCOCCUS HOMINIS THE SIGNIFICANCE OF ISOLATING THIS ORGANISM FROM Randee Upchurch SINGLE SET OF BLOOD CULTURES WHEN MULTIPLE SETS ARE DRAWN IS UNCERTAIN. PLEASE NOTIFY THE MICROBIOLOGY DEPARTMENT WITHIN ONE WEEK IF SPECIATION AND SENSITIVITIES ARE REQUIRED. Performed at [facility], [address] [place], [place] [address]    Report Status 10/13/2020 FINAL  Final  Blood Culture ID Panel (Reflexed)     Status: Abnormal   Collection Time: 10/09/20  7:55 PM  Result Value Ref Range Status   Enterococcus faecalis NOT DETECTED NOT DETECTED Final   Enterococcus Faecium NOT DETECTED NOT DETECTED Final   Listeria monocytogenes NOT DETECTED NOT DETECTED Final   Staphylococcus species DETECTED (Gavon Majano) NOT DETECTED Final    Comment: CRITICAL RESULT CALLED TO, READ BACK BY AND VERIFIED  WITH: L SEAY,PHARMD@2330  10/10/20 MK    Staphylococcus aureus (BCID) NOT DETECTED NOT DETECTED Final   Staphylococcus epidermidis DETECTED (Rocky Rishel) NOT DETECTED Final    Comment: Methicillin (oxacillin) resistant coagulase negative staphylococcus. Possible blood culture contaminant (unless isolated from more than one blood culture draw or clinical case suggests pathogenicity). No  antibiotic treatment is indicated for blood  culture contaminants. CRITICAL RESULT CALLED TO, READ BACK BY AND VERIFIED WITH: L SEAY,PHARMD@2330  10/10/20 MK    Staphylococcus lugdunensis NOT DETECTED NOT DETECTED Final   Streptococcus species NOT DETECTED NOT DETECTED Final   Streptococcus agalactiae NOT DETECTED NOT DETECTED Final   Streptococcus pneumoniae NOT DETECTED NOT DETECTED Final   Streptococcus pyogenes NOT DETECTED NOT DETECTED Final   Haydon Dorris.calcoaceticus-baumannii NOT DETECTED NOT DETECTED Final   Bacteroides fragilis NOT DETECTED NOT DETECTED Final   Enterobacterales NOT DETECTED NOT DETECTED Final   Enterobacter cloacae complex NOT DETECTED NOT DETECTED Final   Escherichia coli NOT DETECTED NOT DETECTED Final   Klebsiella aerogenes NOT DETECTED NOT DETECTED Final   Klebsiella oxytoca NOT DETECTED NOT DETECTED Final   Klebsiella pneumoniae NOT DETECTED NOT DETECTED Final   Proteus species NOT DETECTED NOT DETECTED Final   Salmonella species NOT DETECTED NOT DETECTED Final   Serratia marcescens NOT DETECTED NOT DETECTED Final   Haemophilus influenzae NOT DETECTED NOT DETECTED Final   Neisseria meningitidis NOT DETECTED NOT DETECTED Final   Pseudomonas aeruginosa NOT DETECTED NOT DETECTED Final   Stenotrophomonas maltophilia NOT DETECTED NOT DETECTED Final   Candida albicans NOT DETECTED NOT DETECTED Final   Candida auris NOT DETECTED NOT DETECTED Final   Candida glabrata NOT DETECTED NOT DETECTED Final   Candida krusei NOT DETECTED NOT DETECTED Final   Candida parapsilosis NOT DETECTED NOT DETECTED Final   Candida tropicalis NOT DETECTED NOT DETECTED Final   Cryptococcus neoformans/gattii NOT DETECTED NOT DETECTED Final   Methicillin resistance mecA/C DETECTED (Abdifatah Colquhoun) NOT DETECTED Final    Comment: CRITICAL RESULT CALLED TO, READ BACK BY AND VERIFIED WITH: L SEAY,PHARMD  10/10/20 MK Performed at [facility], [address] [place], [place] [address]   MRSA Next  Gen by PCR, Nasal     Status: None   Collection Time: 10/10/20  4:17 PM   Specimen: Nasal Mucosa; Nasal Swab  Result Value Ref Range Status   MRSA by PCR Next Gen NOT DETECTED NOT DETECTED Final    Comment: (NOTE) The GeneXpert MRSA Assay (FDA approved for NASAL specimens only), is one component of Sondi Desch comprehensive MRSA colonization surveillance program. It is not intended to diagnose MRSA infection nor to guide or monitor treatment for MRSA infections. Test performance is not FDA approved in patients less than 81 years old. Performed at [facility], [address], [place], [place] [address]   Culture, blood (x 2)     Status: None   Collection Time: 10/12/20  1:10 AM   Specimen: BLOOD  Result Value Ref Range Status   Specimen Description BLOOD RIGHT ANTECUBITAL  Final   Special Requests   Final    BOTTLES DRAWN AEROBIC AND ANAEROBIC Blood Culture adequate volume   Culture   Final    NO GROWTH 5 DAYS Performed at [facility], [address] [place], [place] [address]    Report Status 10/17/2020 FINAL  Final  Culture, blood (x 2)     Status: None   Collection Time: 10/12/20  1:10 AM   Specimen: BLOOD RIGHT HAND  Result Value  Ref Range Status   Specimen Description BLOOD RIGHT HAND  Final   Special Requests   Final    BOTTLES DRAWN AEROBIC AND ANAEROBIC Blood Culture adequate volume   Culture   Final    NO GROWTH 5 DAYS Performed at [facility], [address] [place], [place] [address]    Report Status 10/17/2020 FINAL  Final  Urine Culture     Status: Abnormal   Collection Time: 10/16/20 10:40 AM   Specimen: Urine, Clean Catch  Result Value Ref Range Status   Specimen Description URINE, CLEAN CATCH  Final   Special Requests NONE  Final   Culture (Adison Jerger)  Final    <10,000 COLONIES/mL INSIGNIFICANT GROWTH Performed at [facility], [address] [place], [place] [address]    Report Status 10/17/2020 FINAL  Final     Labs: Basic Metabolic  Panel: Recent Labs  Lab 10/12/20 0110 10/14/20 0213 10/15/20 0336 10/16/20 0526 10/18/20 0202  NA 138 140 139 139 139  K 4.1 4.2 3.4* 4.4 4.5  CL 109 111 108 107 105  CO2 GLUCOSE 166* 154* 79 96 101*  BUN 7*  CREATININE 0.81 0.76 0.72 0.79 0.75  CALCIUM 7.9* 7.7* 7.9* 8.1* 9.0  MG 1.9  --  1.8 1.9 2.0  PHOS 2.1*  --  1.8* 1.8* 2.9   Liver Function Tests: Recent Labs  Lab 10/12/20 0110 10/14/20 0213 10/15/20 0336 10/16/20 0526 10/18/20 0202  AST 22 15 14* 14* 20  ALT ALKPHOS 59 45 47 49 52  BILITOT 0.8 0.4 1.0 0.8 0.8  PROT 5.5* 4.9* 4.9* 4.9* 5.3*  ALBUMIN 3.0* 2.4* 2.3* 2.2* 2.5*   No results for input(s): LIPASE, AMYLASE in the last 168 hours. No results for input(s): AMMONIA in the last 168 hours. CBC: Recent Labs  Lab 10/12/20 0110 10/13/20 0248 10/14/20 0213 10/16/20 0526 10/17/20 0111 10/18/20 0202  WBC 10.6* 10.9* 9.2 9.3 10.0 11.2*  NEUTROABS 8.4*  --   --   --   --  6.4  HGB 9.3* 8.4* 8.3* 8.2* 9.2* 9.5*  HCT 29.1* 25.8* 25.5* 25.3* 28.5* 29.9*  MCV 83.9 83.0 84.2 83.2 82.4 83.5  PLT 115* 127* 146* 281 332 411*   Cardiac Enzymes: No results for input(s): CKTOTAL, CKMB, CKMBINDEX, TROPONINI in the last 168 hours. BNP: BNP (last 3 results) Recent Labs    10/09/20 1925  BNP 417.2*    ProBNP (last 3 results) No results for input(s): PROBNP in the last 8760 hours.  CBG: No results for input(s): GLUCAP in the last 168 hours.     Signed:  Lacretia Nicks MD.  [facility] 10/18/2020, 5:49 PM

## 2020-10-19 ENCOUNTER — Inpatient Hospital Stay (HOSPITAL_COMMUNITY): Payer: Medicare HMO

## 2020-10-19 DIAGNOSIS — S72432D Displaced fracture of medial condyle of left femur, subsequent encounter for closed fracture with routine healing: Secondary | ICD-10-CM | POA: Diagnosis not present

## 2020-10-19 DIAGNOSIS — S72422D Displaced fracture of lateral condyle of left femur, subsequent encounter for closed fracture with routine healing: Secondary | ICD-10-CM

## 2020-10-19 DIAGNOSIS — R5381 Other malaise: Secondary | ICD-10-CM

## 2020-10-19 DIAGNOSIS — R262 Difficulty in walking, not elsewhere classified: Secondary | ICD-10-CM | POA: Diagnosis not present

## 2020-10-19 LAB — COMPREHENSIVE METABOLIC PANEL
ALT: 15 U/L (ref 0–44)
AST: 20 U/L (ref 15–41)
Albumin: 2.6 g/dL — ABNORMAL LOW (ref 3.5–5.0)
Alkaline Phosphatase: 57 U/L (ref 38–126)
Anion gap: 6 (ref 5–15)
BUN: 9 mg/dL (ref 8–23)
CO2: 27 mmol/L (ref 22–32)
Calcium: 8.7 mg/dL — ABNORMAL LOW (ref 8.9–10.3)
Chloride: 105 mmol/L (ref 98–111)
Creatinine, Ser: 0.81 mg/dL (ref 0.44–1.00)
GFR, Estimated: >60 mL/min (ref >60)
Glucose, Bld: 105 mg/dL — ABNORMAL HIGH (ref 70–99)
Potassium: 4.2 mmol/L (ref 3.5–5.1)
Sodium: 138 mmol/L (ref 135–145)
Total Bilirubin: 0.8 mg/dL (ref 0.3–1.2)
Total Protein: 5.5 g/dL — ABNORMAL LOW (ref 6.5–8.1)

## 2020-10-19 LAB — CBC WITH DIFFERENTIAL/PLATELET
Abs Immature Granulocytes: 0.23 10*3/uL — ABNORMAL HIGH (ref 0.00–0.07)
Basophils Absolute: 0 10*3/uL (ref 0.0–0.1)
Basophils Relative: 0 %
Eosinophils Absolute: 0.3 10*3/uL (ref 0.0–0.5)
Eosinophils Relative: 2 %
HCT: 30.7 % — ABNORMAL LOW (ref 36.0–46.0)
Hemoglobin: 9.9 g/dL — ABNORMAL LOW (ref 12.0–15.0)
Immature Granulocytes: 2 %
Lymphocytes Relative: 26 %
Lymphs Abs: 3.1 10*3/uL (ref 0.7–4.0)
MCH: 27 pg (ref 26.0–34.0)
MCHC: 32.2 g/dL (ref 30.0–36.0)
MCV: 83.7 fL (ref 80.0–100.0)
Monocytes Absolute: 1.1 10*3/uL — ABNORMAL HIGH (ref 0.1–1.0)
Monocytes Relative: 9 %
Neutro Abs: 7.3 10*3/uL (ref 1.7–7.7)
Neutrophils Relative %: 61 %
Platelets: 455 10*3/uL — ABNORMAL HIGH (ref 150–400)
RBC: 3.67 MIL/uL — ABNORMAL LOW (ref 3.87–5.11)
RDW: 18.7 % — ABNORMAL HIGH (ref 11.5–15.5)
WBC: 12 10*3/uL — ABNORMAL HIGH (ref 4.0–10.5)
nRBC: 0.6 % — ABNORMAL HIGH (ref 0.0–0.2)

## 2020-10-19 MED ORDER — METOCLOPRAMIDE HCL 5 MG PO TABS
10.0000 mg | ORAL_TABLET | Freq: Three times a day (TID) | ORAL | Status: DC
  Administered 2020-10-19 – 2020-10-22 (×13): 10 mg via ORAL
  Filled 2020-10-19 (×16): qty 2
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
## 2020-10-19 NOTE — Progress Notes (Addendum)
Inpatient Rehabilitation Medication Review by a Pharmacist  A complete drug regimen review was completed for this patient to identify any potential clinically significant medication issues.  High Risk Drug Classes Is patient taking? Indication by Medication  Antipsychotic Yes Prochlorperazine PRN for nausea  Anticoagulant Yes Enoxaparin for DVT prophylaxis  Antibiotic Yes Cephalexin and metronidazole for acute cholecystitis  Opioid Yes Oxycodone PRN acute pain  Antiplatelet Yes Aspirin for NSTEMI  Hypoglycemics/insulin No   Vasoactive Medication No   Chemotherapy No   Other No      Type of Medication Issue Identified Description of Issue Recommendation(s)  Drug Interaction(s) (clinically significant)     Duplicate Therapy     Allergy     No Medication Administration End Date  Cephalexin and metronidazole end date 9/2 per [facility] notes Add end date 9/2 to orders  Incorrect Dose     Additional Drug Therapy Needed     Significant med changes from prior encounter (inform family/care partners about these prior to discharge). Stopped home medications: celecoxib, dicyclomine, escitalopram, fluticasone nasal spray, hydrocodone/acetaminophen, tacrolimus topical ointment, valsartan New medications: Aspirin, metoprolol succinate, sacubitril/valsartan, spironolactone  Educate patient at discharge  Other       Clinically significant medication issues were identified that warrant physician communication and completion of prescribed/recommended actions by midnight of the next day:  Yes  Name of provider notified for urgent issues identified: Marissa Nestle   Provider Method of Notification: Secure chat  Time spent performing this drug regimen review (minutes):  20 min  Alphia Moh, PharmD, [place], [facility] Clinical Pharmacist  Please check AMION for all [facility] Pharmacy phone numbers After 10:00 PM, call Main Pharmacy [phone]    8/31 12:30pm -> Stop dates added for antibiotics Thank you Okey Regal, PharmD

## 2020-10-19 NOTE — Plan of Care (Signed)
  Problem: RH Balance Goal: LTG Patient will maintain dynamic standing balance (PT) Description: LTG:  Patient will maintain dynamic standing balance with assistance during mobility activities (PT) Flowsheets (Taken 10/19/2020 1230) LTG: Pt will maintain dynamic standing balance during mobility activities with:: Supervision/Verbal cueing   Problem: Sit to Stand Goal: LTG:  Patient will perform sit to stand with assistance level (PT) Description: LTG:  Patient will perform sit to stand with assistance level (PT) Flowsheets (Taken 10/19/2020 1230) LTG: PT will perform sit to stand in preparation for functional mobility with assistance level: Supervision/Verbal cueing   Problem: RH Bed Mobility Goal: LTG Patient will perform bed mobility with assist (PT) Description: LTG: Patient will perform bed mobility with assistance, with/without cues (PT). Flowsheets (Taken 10/19/2020 1230) LTG: Pt will perform bed mobility with assistance level of: Independent with assistive device    Problem: RH Bed to Chair Transfers Goal: LTG Patient will perform bed/chair transfers w/assist (PT) Description: LTG: Patient will perform bed to chair transfers with assistance (PT). Flowsheets (Taken 10/19/2020 1230) LTG: Pt will perform Bed to Chair Transfers with assistance level: Supervision/Verbal cueing   Problem: RH Car Transfers Goal: LTG Patient will perform car transfers with assist (PT) Description: LTG: Patient will perform car transfers with assistance (PT). Flowsheets (Taken 10/19/2020 1230) LTG: Pt will perform car transfers with assist:: Supervision/Verbal cueing   Problem: RH Ambulation Goal: LTG Patient will ambulate in controlled environment (PT) Description: LTG: Patient will ambulate in a controlled environment, # of feet with assistance (PT). Flowsheets (Taken 10/19/2020 1230) LTG: Pt will ambulate in controlled environ  assist needed:: Supervision/Verbal cueing LTG: Ambulation distance in  controlled environment: 150 ft with LRAD Goal: LTG Patient will ambulate in home environment (PT) Description: LTG: Patient will ambulate in home environment, # of feet with assistance (PT). Flowsheets (Taken 10/19/2020 1230) LTG: Pt will ambulate in home environ  assist needed:: Supervision/Verbal cueing LTG: Ambulation distance in home environment: 75 ft with LRAD   Problem: RH Stairs Goal: LTG Patient will ambulate up and down stairs w/assist (PT) Description: LTG: Patient will ambulate up and down # of stairs with assistance (PT) Flowsheets (Taken 10/19/2020 1230) LTG: Pt will ambulate up/down stairs assist needed:: Minimal Assistance - Patient > 75% LTG: Pt will  ambulate up and down number of stairs: 2 stairs with no handrails
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
## 2020-10-19 NOTE — Progress Notes (Signed)
KUB shows mild ileus and patient with abdominal distension this am. She did  sleep well last night and plans on having daughter bring in her CPAP. Will downgrade diet to all liquids and add reglan. Repeat KUB in am.

## 2020-10-19 NOTE — Progress Notes (Signed)
Bilateral lower extremity venous duplex has been completed. Preliminary results can be found in CV Proc through chart review.   10/19/20 12:19 PM Olen Cordial RVT

## 2020-10-19 NOTE — Progress Notes (Signed)
Pt slept fairly well this shift. Noncompliant with CPAP midway through shift, reports mask is too big. Pt reports that she will ask her daughter to bring personal CPAP from home. Medicated x2 for c/o left leg pain, partially effective. Pt reports that this morning is the first time she has been pain free. No distress noted.
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
## 2020-10-19 NOTE — Progress Notes (Signed)
Inpatient Rehabilitation Center Individual Statement of Services  Patient Name:  Heather [name]  Date:  10/19/2020  Welcome to the Inpatient Rehabilitation Center.  Our goal is to provide you with an individualized program based on your diagnosis and situation, designed to meet your specific needs.  With this comprehensive rehabilitation program, you will be expected to participate in at least 3 hours of rehabilitation therapies Monday-Friday, with modified therapy programming on the weekends.  Your rehabilitation program will include the following services:  Physical Therapy (PT), Occupational Therapy (OT), Speech Therapy (ST), 24 hour per day rehabilitation nursing, Therapeutic Recreaction (TR), Neuropsychology, Care Coordinator, Rehabilitation Medicine, Nutrition Services, Pharmacy Services, and Other  Weekly team conferences will be held on Tuesdays to discuss your progress.  Your Inpatient Rehabilitation Care Coordinator will talk with you frequently to get your input and to update you on team discussions.  Team conferences with you and your family in attendance may also be held.  Expected length of stay:  14-16 Days  Overall anticipated outcome:  Supervision to Mod I   Depending on your progress and recovery, your program may change. Your Inpatient Rehabilitation Care Coordinator will coordinate services and will keep you informed of any changes. Your Inpatient Rehabilitation Care Coordinator's name and contact numbers are listed  below.  The following services may also be recommended but are not provided by the Inpatient Rehabilitation Center:   Home Health Rehabiltiation Services Outpatient Rehabilitation Services    Arrangements will be made to provide these services after discharge if needed.  Arrangements include referral to agencies that provide these services.  Your insurance has been verified to be:  [employer]  Your primary doctor is:  Janece Canterbury, MD    Pertinent information will be shared with your doctor and your insurance company.  Inpatient Rehabilitation Care Coordinator:  Lavera Guise, [place] [phone] or [phone]  Information discussed with and copy given to patient by: Andria Rhein, 10/19/2020, 8:27 AM

## 2020-10-19 NOTE — Progress Notes (Signed)
Pt has home CPAP at bedside. CPAP removed from bag, cords and machine inspected by this RT for damage, sterile water refilled, machine set up and nasal pillows placed within reach for pt use. Advised pt to notify for RT if any further assistance is needed. Hospital issued CPAP removed from room at this time.

## 2020-10-19 NOTE — Progress Notes (Signed)
Occupational Therapy Session Note  Patient Details  Name: Heather [name] MRN: [id number] Date of Birth: 01/10/1946  Today's Date: 10/19/2020 OT Individual Time: 1300-1345 OT Individual Time Calculation (min): 45 min    Short Term Goals: Week 1:  OT Short Term Goal 1 (Week 1): Pt will complete ambulatory toilet transfers with min assist OT Short Term Goal 2 (Week 1): Pt will complete LB dressing with min assist with AE as needed OT Short Term Goal 3 (Week 1): Pt will complete 2 grooming tasks in standing to increase activity tolerance/endurance  Skilled Therapeutic Interventions/Progress Updates:  Balance/vestibular training;Community reintegration;Discharge planning;Disease mangement/prevention;DME/adaptive equipment instruction;Functional mobility training;Neuromuscular re-education;Pain management;Patient/family education;Psychosocial support;Self Care/advanced ADL retraining;Skin care/wound managment;Therapeutic Activities;Therapeutic Exercise;UE/LE Strength taining/ROM   Pt sleeping upon arrival but easily aroused. Pt agreeable to "trying" to participate in therapy. OT intervention with focus on bed mobility, sit<>stand, standing balance, activity tolerance, and safety awareness. Pt verbalized weight bearing status. Supine<>sit EOB with CGA. Pt dependent for donning shoes prior to sit<>stand. Sit<>stand X 4 with min A. Standing balance with RW supervision. Pt c/o increased pain with activity and states she is tired from day's therapy. Pt returned to bed and ice applied to LLE. Pt remained in bed with all needs within reach and bed alarm activated.  Therapy Documentation Precautions:  Precautions Precautions: Fall Precaution Comments: cholecystostomy tube; monitor BP (possible ortho hypotension) Restrictions Weight Bearing Restrictions: Yes LLE Weight Bearing: Partial weight bearing LLE Partial Weight Bearing Percentage or Pounds: 50% General: General OT Amount of Missed  Time: 30 Minutes Pain:  Pt c/o increase LLE pain with activity; repositioned and ice applied   Therapy/Group: Individual Therapy  Rich Brave 10/19/2020, 1:45 PM

## 2020-10-19 NOTE — Progress Notes (Signed)
Inpatient Rehabilitation  Patient information reviewed and entered into eRehab system by Kden Wagster M. Miana Politte, M.A., CCC/SLP, PPS Coordinator.  Information including medical coding, functional ability and quality indicators will be reviewed and updated through discharge.    

## 2020-10-20 ENCOUNTER — Inpatient Hospital Stay (HOSPITAL_COMMUNITY): Payer: Medicare HMO

## 2020-10-20 ENCOUNTER — Other Ambulatory Visit: Payer: Medicare HMO

## 2020-10-20 DIAGNOSIS — S72422D Displaced fracture of lateral condyle of left femur, subsequent encounter for closed fracture with routine healing: Secondary | ICD-10-CM | POA: Diagnosis not present

## 2020-10-20 DIAGNOSIS — S72432D Displaced fracture of medial condyle of left femur, subsequent encounter for closed fracture with routine healing: Secondary | ICD-10-CM | POA: Diagnosis not present

## 2020-10-20 NOTE — Progress Notes (Signed)
Physical Therapy Session Note  Patient Details  Name: Heather [name] MRN: [id number] Date of Birth: Jan 25, 1946  Today's Date: 10/20/2020 PT Individual Time: 1100-1200 PT Individual Time Calculation (min): 60 min   Short Term Goals: Week 1:  PT Short Term Goal 1 (Week 1): Pt will perform least restrictive transfer with min A consistently PT Short Term Goal 2 (Week 1): Pt will ambulate x 50 ft with LRAD and min A PT Short Term Goal 3 (Week 1): Pt will initiate stair training  Skilled Therapeutic Interventions/Progress Updates:    Pt received in bed with NT performing dependent brief change. Pt reported 9/10 surgical pain, therapy to tolerance.   Supine <> sit with supervision. Sit to stand with CGA throughout session to RW. Min cueing for WB precaution, pt unlikely to break d/t pain when WB. ambulatory transfer bed<>w/c<>commode with CGA and RW. Gait up to 20 ft with RW and CGA.  Pt gathered and donned night gown with supervision. Pt propelled w/c with BUE to/from therapy gym with supervision. Pt performed 5xSTS =38s.  Upon completing test, pt reported she was having a bowel movement. Pt transported back to room for time. ambulatory transfer into bathroom as written above, pt was tot  A for clothing management and supervision for hygiene. Therapist donned new brief in standing. Pt transferred to recliner as written above and performed LAQ 2 x 10 for improved ROM and strength. Pt remained in recliner at end of session and was left with all needs in reach and alarm active.   Therapy Documentation Precautions:  Precautions Precautions: Fall Precaution Comments: cholecystostomy tube; monitor BP (possible ortho hypotension) Restrictions Weight Bearing Restrictions: Yes LLE Weight Bearing: Partial weight bearing LLE Partial Weight Bearing Percentage or Pounds: 50%     Therapy/Group: Individual Therapy  Juluis Rainier 10/20/2020, 12:41 PM

## 2020-10-20 NOTE — Progress Notes (Signed)
Occupational Therapy Session Note  Patient Details  Name: Heather [name] MRN: [id number] Date of Birth: 1946-01-09  Today's Date: 10/21/2020 OT Individual Time: 0347-4259 OT Individual Time Calculation (min): 26 min  34 minutes missed  Short Term Goals: Week 1:  OT Short Term Goal 1 (Week 1): Pt will complete ambulatory toilet transfers with min assist OT Short Term Goal 2 (Week 1): Pt will complete LB dressing with min assist with AE as needed OT Short Term Goal 3 (Week 1): Pt will complete 2 grooming tasks in standing to increase activity tolerance/endurance  Skilled Therapeutic Interventions/Progress Updates:    Pt greeted in bed with c/o buttocks pain from multiple incontinent BMs. Pt reported having BM every time she moved with PT earlier and missed therapy time. This was also confirmed by PT. Pt did not want to attempt any more mobility due to fatigue. Agreeable to be assisted in sidelying position to decrease buttocks pain. Pt described pain as "raw" feeling. After assisting pt with repositioning, provided her with a free reading book + 2 word find puzzles for occupational enrichment until her incontinence lessened. Time missed due to refusal/incontinence impeding participation.   Therapy Documentation Precautions:  Precautions Precautions: Fall Precaution Comments: cholecystostomy tube; monitor BP (possible ortho hypotension) Restrictions Weight Bearing Restrictions: Yes LLE Weight Bearing: Partial weight bearing LLE Partial Weight Bearing Percentage or Pounds: 50%  Vital Signs: Therapy Vitals Temp: 99 F (37.2 C) Pulse Rate: 78 Resp: 18 BP: 131/60 Patient Position (if appropriate): Lying Oxygen Therapy SpO2: 97 % O2 Device: Room Air   ADL: ADL Grooming: Setup Where Assessed-Grooming: Sitting at sink Upper Body Bathing: Setup, Supervision/safety Where Assessed-Upper Body Bathing: Sitting at sink Lower Body Bathing: Moderate assistance, Maximal  assistance Where Assessed-Lower Body Bathing: Standing at sink, Sitting at sink Upper Body Dressing: Minimal assistance Where Assessed-Upper Body Dressing: Sitting at sink Lower Body Dressing: Not assessed Toileting: Not assessed Toilet Transfer: Minimal assistance Toilet Transfer Method: Stand pivot Toilet Transfer Equipment: Bedside commode    Therapy/Group: Individual Therapy  [name] A [name] 10/21/2020, 3:32 PM

## 2020-10-20 NOTE — Progress Notes (Signed)
PROGRESS NOTE   Subjective/Complaints:  Feels much better today- wondering if needs to continue liquid diet- since feeling better- "pooped SO much" yesterday.    ROS:  Pt denies SOB, abd pain, CP, N/V/C/D, and vision changes   Objective:   DG Abd 1 View  Result Date: 10/18/2020 CLINICAL DATA:  Abdomen pain EXAM: ABDOMEN - 1 VIEW COMPARISON:  None. FINDINGS: Mild increased small and large bowel gas without obstructive pattern. Cholecystostomy tube in the right upper quadrant. Surgical hardware in lumbar spine IMPRESSION: Mild air-filled small and large bowel suggestive mild ileus. Electronically Signed   By: Jasmine Pang M.D.   On: 10/18/2020 22:29   VAS [place] LOWER EXTREMITY VENOUS (DVT)  Result Date: 10/19/2020  Lower Venous DVT Study Patient Name:  BAILIE COURTNEY  Date of Exam:   10/19/2020 Medical Rec #: [id number]               Accession #:    [id number] Date of Birth: 1945-12-22              Patient Gender: F Patient Age:   75 years Exam Location:  [facility] Procedure:      VAS [place] LOWER EXTREMITY VENOUS (DVT) Referring Phys: PAMELA LOVE --------------------------------------------------------------------------------  Indications: Immobility.  Risk Factors: Surgery. Limitations: Poor ultrasound/tissue interface and patient positioning. Comparison Study: No prior studies. Performing Technologist: Chanda Busing RVT  Examination Guidelines: A complete evaluation includes B-mode imaging, spectral Doppler, color Doppler, and power Doppler as needed of all accessible portions of each vessel. Bilateral testing is considered an integral part of a complete examination. Limited examinations for reoccurring indications may be performed as noted. The reflux portion of the exam is performed with the patient in reverse Trendelenburg.  +---------+---------------+---------+-----------+----------+--------------+ RIGHT     CompressibilityPhasicitySpontaneityPropertiesThrombus Aging +---------+---------------+---------+-----------+----------+--------------+ CFV      Full           Yes      Yes                                 +---------+---------------+---------+-----------+----------+--------------+ SFJ      Full                                                        +---------+---------------+---------+-----------+----------+--------------+ FV Prox  Full                                                        +---------+---------------+---------+-----------+----------+--------------+ FV Mid   Full                                                        +---------+---------------+---------+-----------+----------+--------------+  FV DistalFull                                                        +---------+---------------+---------+-----------+----------+--------------+ PFV      Full                                                        +---------+---------------+---------+-----------+----------+--------------+ POP      Full           Yes      Yes                                 +---------+---------------+---------+-----------+----------+--------------+ PTV      Full                                                        +---------+---------------+---------+-----------+----------+--------------+ PERO     Full                                                        +---------+---------------+---------+-----------+----------+--------------+   +---------+---------------+---------+-----------+----------+-------------------+ LEFT     CompressibilityPhasicitySpontaneityPropertiesThrombus Aging      +---------+---------------+---------+-----------+----------+-------------------+ CFV      Full           Yes      Yes                                      +---------+---------------+---------+-----------+----------+-------------------+ SFJ      Full                                                              +---------+---------------+---------+-----------+----------+-------------------+ FV Prox  Full                                                             +---------+---------------+---------+-----------+----------+-------------------+ FV Mid   Full                                                             +---------+---------------+---------+-----------+----------+-------------------+ FV DistalFull                                                             +---------+---------------+---------+-----------+----------+-------------------+  PFV      Full                                                             +---------+---------------+---------+-----------+----------+-------------------+ POP      Full           Yes      Yes                                      +---------+---------------+---------+-----------+----------+-------------------+ PTV      Full                                                             +---------+---------------+---------+-----------+----------+-------------------+ PERO                                                  Not well visualized +---------+---------------+---------+-----------+----------+-------------------+     Summary: RIGHT: - There is no evidence of deep vein thrombosis in the lower extremity.  - No cystic structure found in the popliteal fossa.  LEFT: - There is no evidence of deep vein thrombosis in the lower extremity. However, portions of this examination were limited- see technologist comments above.  - No cystic structure found in the popliteal fossa.  *See table(s) above for measurements and observations. Electronically signed by Coral Else MD on 10/19/2020 at 6:28:49 PM.    Final    Recent Labs    10/18/20 0202 10/19/20 0500  WBC 11.2* 12.0*  HGB 9.5* 9.9*  HCT 29.9* 30.7*  PLT 411* 455*   Recent Labs    10/18/20 0202 10/19/20 0500  NA 139 138  K 4.5  4.2  CL 105 105  CO2 27 27  GLUCOSE 101* 105*  BUN 7* 9  CREATININE 0.75 0.81  CALCIUM 9.0 8.7*    Intake/Output Summary (Last 24 hours) at 10/20/2020 1027 Last data filed at 10/20/2020 0834 Gross per 24 hour  Intake 610 ml  Output 300 ml  Net 310 ml        Physical Exam: Vital Signs Blood pressure (!) 115/58, pulse 83, temperature 98.7 F (37.1 C), resp. rate 18, height 5\' 6"  (1.676 m), weight 71.2 kg, SpO2 95 %.   General: awake, alert, appropriate, laying supine in bed; not sure if she remembered me; NAD HENT: conjugate gaze; oropharynx moist CV: regular rate; no JVD Pulmonary: CTA B/L; no W/R/R- good air movement GI:  much softer- less distended; NT except around drain; hypoactive BS Psychiatric: appropriate Neurological: Ox2-3 - didn't remember me form yesterday  Musculoskeletal:     Cervical back: Normal range of motion. No rigidity.     Comments: Left thigh incisions C/D/I with skin glue. Left foot discolored (betadine?) and slighter warmer to touch then right.  Incisions looks great on L distal thigh Strength 5-/5 in UE B/L as well as RLE 5-/5; LLE- 4/5 HF/KE/KF and 5-/5 DF and  PF  Skin:    General: Skin is warm and dry.     Comments: L thigh incision as above  Neurological:     Mental Status: She is alert and oriented to person, place, and time.    Assessment/Plan: 1. Functional deficits which require 3+ hours per day of interdisciplinary therapy in a comprehensive inpatient rehab setting. Physiatrist is providing close team supervision and 24 hour management of active medical problems listed below. Physiatrist and rehab team continue to assess barriers to discharge/monitor patient progress toward functional and medical goals  Care Tool:  Bathing    Body parts bathed by patient: Right arm, Left arm, Chest, Abdomen, Face   Body parts bathed by helper: Buttocks, Right upper leg, Left upper leg, Right lower leg, Left lower leg, Front perineal area      Bathing assist Assist Level: Maximal Assistance - Patient 24 - 49%     Upper Body Dressing/Undressing Upper body dressing   What is the patient wearing?: Hospital gown only    Upper body assist Assist Level: Minimal Assistance - Patient > 75%    Lower Body Dressing/Undressing Lower body dressing      What is the patient wearing?: Underwear/pull up     Lower body assist Assist for lower body dressing: Maximal Assistance - Patient 25 - 49%     Toileting Toileting    Toileting assist Assist for toileting: Moderate Assistance - Patient 50 - 74%     Transfers Chair/bed transfer  Transfers assist     Chair/bed transfer assist level: Minimal Assistance - Patient > 75%     Locomotion Ambulation   Ambulation assist      Assist level: Minimal Assistance - Patient > 75% Assistive device: Walker-rolling Max distance: 8'   Walk 10 feet activity   Assist  Walk 10 feet activity did not occur: Safety/medical concerns        Walk 50 feet activity   Assist Walk 50 feet with 2 turns activity did not occur: Safety/medical concerns         Walk 150 feet activity   Assist Walk 150 feet activity did not occur: Safety/medical concerns         Walk 10 feet on uneven surface  activity   Assist Walk 10 feet on uneven surfaces activity did not occur: Safety/medical concerns         Wheelchair     Assist Is the patient using a wheelchair?: No             Wheelchair 50 feet with 2 turns activity    Assist            Wheelchair 150 feet activity     Assist          Blood pressure (!) 115/58, pulse 83, temperature 98.7 F (37.1 C), resp. rate 18, height  (1.676 m), weight 71.2 kg, SpO2 95 %.  Medical Poblem List and Plan: 1. L distal femur fx s/p ORIF secondary to multiple falls- with complicated medical issues- PWB on LLE             -patient may not shower until abd drain removed?             -ELOS/Goals: 10-14  days- supervision  Con't PT and OT- CIR 2.  Antithrombotics: -DVT/anticoagulation:  Pharmaceutical: Lovenox             -antiplatelet therapy: ASA 3. Pain Management:  Hydrocodone prn.   9/1- pain is  controlled- con't regimen 4. Mood: LCSW to follow for evaluation and support.              -antipsychotic agents: N/A 5. Neuropsych: This patient is capable of making decisions on her own behalf. 6. Skin/Wound Care: Monitor incision for healing.  7. Fluids/Electrolytes/Nutrition: Monitor I/O. Check lytes in am.             --Hypophosphatemia/hypomagnesemia improving post supplement.             --add juven for low protein stores.  8. Left distal femur Fx s/p ORIF: PWB LLE--continue to reinforce precautions 9. 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 Episode of SVT: Treated with IV BB--->po Toprol 11. Sepsis due to cholecystitis: Perc drain in place with Keflex/Flagyl thorough 09/02             --Leucocytosis continues to fluctuate  9/1- WBC 12k- will recheck in AM- is afebrile and feeling BETTER- so will wait til CBC for AM done.  12. Acute renal failure: SCr 2.21 at admission and has resolved-->0.75 9/1- Cr 0.81- stable- con't to monitor  13. Acute blood loss anemia: Continue to monitor H/H.  14. 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   15. Chronic insomnia: Continue Trazosone at bedtime.  16. OSA: Resume CPAP. 17. Abdominal pain: Has had diarrhea X 3 today--question due to diet v/s issues with drain              --Monitor for now. Advised on avoiding fatty foods. 9/1- see # 180 18. Ileus 9/1- got cleaned out yesterday per staff- on liquid diet-  4 stools since yesterday AM- 1 formed; 3 liquid- KUB pending for this AM. Won't change liquid diet until know ileus has improved.       LOS: 2 days A FACE TO FACE EVALUATION WAS PERFORMED  Cheresa Siers 10/20/2020, 10:27  AM

## 2020-10-20 NOTE — Progress Notes (Signed)
Pt slept fairly well throughout the night. Pt wore home CPAP for a couple of hours and then removed. Pt reports that she takes it off because it dries her mouth out. Medicated x2 for pain to left leg with partial effects noted.

## 2020-10-20 NOTE — Progress Notes (Signed)
Occupational Therapy Session Note  Patient Details  Name: Heather [name] MRN: [id number] Date of Birth: 04/23/1945  Today's Date: 10/20/2020 OT Individual Time: 1400-1455 OT Individual Time Calculation (min): 55 min  and Today's Date: 10/20/2020 OT Missed Time: 20 Minutes Missed Time Reason: Pain;Patient fatigue   Short Term Goals: Week 1:  OT Short Term Goal 1 (Week 1): Pt will complete ambulatory toilet transfers with min assist OT Short Term Goal 2 (Week 1): Pt will complete LB dressing with min assist with AE as needed OT Short Term Goal 3 (Week 1): Pt will complete 2 grooming tasks in standing to increase activity tolerance/endurance   Skilled Therapeutic Interventions/Progress Updates:    Pt greeted at time of session sitting up in recliner recently finished with PT session, fatigued but willing to participate. Pt c/o pain in LLE throughout session, RN aware at end of session of pt request for pain meds. Reviewed with pt at beginning of session WB status and pt aware. Stand pivot CGA/Min recliner > wheelchair with RW. Self propel in hall straight pathway room > tub room and therapist demonstrating TTB transfer for later on when pt cleared to shower, pt receptive and verbalized understanding of method. Self propel tub room > main gym entrance with Min A and BUE propulsion, stand pivot wheelchair > mat Min/CGA with noted very narrow BOS and cues to correct. Seated on mat BUE there ex w/ 3# dowel for bicep curl and chest press 1x15 with LLE elevated on step. Pt c/o increase in hip pain, sit > supine on mat with Mod A but increase in pain laying flat, able to tolerate with some hip/knee flexion. Switched to towel slides 2x10 with mod facilitation to improve ROM for ADL tasks. Supine > sit Min A on EOM and stand pivot mat > wheelchair > bed Min/CGA w/ RW. Pt stating 10/10 pain and unable to continue. RN aware. Pain relieved to 7/10 in bed resting. Missed 20 mins of OT.   Therapy  Documentation Precautions:  Precautions Precautions: Fall Precaution Comments: cholecystostomy tube; monitor BP (possible ortho hypotension) Restrictions Weight Bearing Restrictions: Yes LLE Weight Bearing: Partial weight bearing LLE Partial Weight Bearing Percentage or Pounds: 50%     Therapy/Group: Individual Therapy  Erasmo Score 10/20/2020, 12:58 PM

## 2020-10-20 NOTE — Progress Notes (Addendum)
Physical Therapy Session Note  Patient Details  Name: Heather [name] MRN: [id number] Date of Birth: December 04, 1945  Today's Date: 10/20/2020 PT Individual Time: 1300-1339 PT Individual Time Calculation (min): 39 min   Short Term Goals: Week 1:  PT Short Term Goal 1 (Week 1): Pt will perform least restrictive transfer with min A consistently PT Short Term Goal 2 (Week 1): Pt will ambulate x 50 ft with LRAD and min A PT Short Term Goal 3 (Week 1): Pt will initiate stair training    Skilled Therapeutic Interventions/Progress Updates:  Pt received sitting in recliner and agreeable to PT. PT noted brakes on the L hand side of WC were not locking. FOr pt safety, PT adjusted WC brakes to reduce fall risk. Pt then attempted to transfer to [facility], but noted to have incontinent BM once in standing. Ambulatory transfer to toilet with min assist and RW; cues for proper WB through the LLE. Pt then able to void additional on BSC over toilet. Peri care completed by PT and then by Pt for adequate hygiene. PT performed clothing management. Ambulatory transfer to [place] with RW and min assist. Seated rest break, then pt performed sit<>stand  with min-mod assist to perform hand hygiene at sink. Patient then performed stand pivot to recliner with min A and RW. Pt left sitting in recliner with call bell in reach and all needs met.         Therapy Documentation Precautions:  Precautions Precautions: Fall Precaution Comments: cholecystostomy tube; monitor BP (possible ortho hypotension) Restrictions Weight Bearing Restrictions: Yes LLE Weight Bearing: Partial weight bearing LLE Partial Weight Bearing Percentage or Pounds: 50%  Therapy Vitals Temp: 99.9 F (37.7 C) Temp Source: Oral Pulse Rate: 76 Resp: 14 BP: 113/69 Patient Position (if appropriate): Sitting Oxygen Therapy O2 Device: Room Air Pain: Pain Assessment Pain Scale: 0-10 Pain Score: 3  Pain Type: Surgical pain Pain Location:  Leg Pain Orientation: Left Pain Descriptors / Indicators: Throbbing;Spasm Pain Intervention(s): Medication (See eMAR)    Therapy/Group: Individual Therapy  Lorie Phenix 10/20/2020, 2:24 PM

## 2020-10-21 LAB — CBC WITH DIFFERENTIAL/PLATELET
Abs Immature Granulocytes: 0.16 10*3/uL — ABNORMAL HIGH (ref 0.00–0.07)
Basophils Absolute: 0 10*3/uL (ref 0.0–0.1)
Basophils Relative: 0 %
Eosinophils Absolute: 0.2 10*3/uL (ref 0.0–0.5)
Eosinophils Relative: 2 %
HCT: 31.1 % — ABNORMAL LOW (ref 36.0–46.0)
Hemoglobin: 10 g/dL — ABNORMAL LOW (ref 12.0–15.0)
Immature Granulocytes: 1 %
Lymphocytes Relative: 26 %
Lymphs Abs: 3.2 10*3/uL (ref 0.7–4.0)
MCH: 27.2 pg (ref 26.0–34.0)
MCHC: 32.2 g/dL (ref 30.0–36.0)
MCV: 84.7 fL (ref 80.0–100.0)
Monocytes Absolute: 1 10*3/uL (ref 0.1–1.0)
Monocytes Relative: 8 %
Neutro Abs: 7.7 10*3/uL (ref 1.7–7.7)
Neutrophils Relative %: 63 %
Platelets: 521 10*3/uL — ABNORMAL HIGH (ref 150–400)
RBC: 3.67 MIL/uL — ABNORMAL LOW (ref 3.87–5.11)
RDW: 19.2 % — ABNORMAL HIGH (ref 11.5–15.5)
WBC: 12.3 10*3/uL — ABNORMAL HIGH (ref 4.0–10.5)
nRBC: 0.2 % (ref 0.0–0.2)

## 2020-10-21 LAB — BASIC METABOLIC PANEL
Anion gap: 9 (ref 5–15)
BUN: 11 mg/dL (ref 8–23)
CO2: 26 mmol/L (ref 22–32)
Calcium: 8.6 mg/dL — ABNORMAL LOW (ref 8.9–10.3)
Chloride: 102 mmol/L (ref 98–111)
Creatinine, Ser: 0.88 mg/dL (ref 0.44–1.00)
GFR, Estimated: >60 mL/min (ref >60)
Glucose, Bld: 105 mg/dL — ABNORMAL HIGH (ref 70–99)
Potassium: 4.4 mmol/L (ref 3.5–5.1)
Sodium: 137 mmol/L (ref 135–145)

## 2020-10-21 MED ORDER — SENNA 8.6 MG PO TABS: 1.0000 | ORAL_TABLET | Freq: Every day | ORAL | Status: DC | PRN

## 2020-10-21 MED ORDER — HYDROCORTISONE (PERIANAL) 2.5 % EX CREA
TOPICAL_CREAM | Freq: Four times a day (QID) | CUTANEOUS | Status: DC
  Filled 2020-10-21 (×2): qty 28.35

## 2020-10-21 MED ORDER — LIDOCAINE HCL URETHRAL/MUCOSAL 2 % EX GEL: 1.0000 "application " | Freq: Three times a day (TID) | CUTANEOUS | Status: DC | PRN

## 2020-10-21 MED ORDER — DIPHENOXYLATE-ATROPINE 2.5-0.025 MG PO TABS
2.0000 | ORAL_TABLET | Freq: Four times a day (QID) | ORAL | Status: DC | PRN
  Administered 2020-10-22 – 2020-10-27 (×8): 2 via ORAL
  Filled 2020-10-21 (×8): qty 2

## 2020-10-21 MED ORDER — HYDROCORTISONE (PERIANAL) 2.5 % EX CREA
TOPICAL_CREAM | Freq: Two times a day (BID) | CUTANEOUS | Status: DC
  Filled 2020-10-21: qty 28.35

## 2020-10-21 MED ORDER — DIPHENOXYLATE-ATROPINE 2.5-0.025 MG PO TABS
1.0000 | ORAL_TABLET | Freq: Once | ORAL | Status: AC
  Administered 2020-10-21: 1 via ORAL
  Filled 2020-10-21: qty 1

## 2020-10-21 MED ORDER — POLYETHYLENE GLYCOL 3350 17 G PO PACK: 17.0000 g | PACK | Freq: Every day | ORAL | Status: DC

## 2020-10-21 MED ORDER — WITCH HAZEL-GLYCERIN EX PADS
MEDICATED_PAD | CUTANEOUS | Status: DC | PRN
  Filled 2020-10-21: qty 100

## 2020-10-21 NOTE — Progress Notes (Signed)
Physical Therapy Session Note  Patient Details  Name: Heather [name] MRN: [id number] Date of Birth: [birth year]  Today's Date: 10/21/2020 PT Individual Time: 7035-0093 and 1300-1320 PT Individual Time Calculation (min): 44 min and 20 min  Short Term Goals: Week 1:  PT Short Term Goal 1 (Week 1): Pt will perform least restrictive transfer with min A consistently PT Short Term Goal 2 (Week 1): Pt will ambulate x 50 ft with LRAD and min A PT Short Term Goal 3 (Week 1): Pt will initiate stair training  Skilled Therapeutic Interventions/Progress Updates:   First session:  Pt presents supine in bed and agreeable to therapy although states continuous diarrhea.  Pt transfers sup to sit w/ supervision but immediately states need for BM.  Pt returns to supine w/ CGA and then rolls w/ CGA  and then partially bridges to accept BP.  Pt continent of BM in bedpan x 2 trials as well as diarrhea on brief.  NT will chart.  Pt required total A for pericare from PT in sidelying position.  PT then placed clean brief underneath pt, but did not attach.  Nurse in to apply cream to bottom (just received from pharmacy).  Pt performed LE there ex of 2 x 10 QS, abd/add, SLR, HS, AP w/ AAROM to LLE for full ROM.  Pt remained in bed w/ bed alarm on and all needs in reach.   Second session:  Pt presents supine in bed and states just cleaned up from bout of diarrhea.  Pt agreeable to attempt mobility, but attempts to roll to right for transition to sit when again c/o diarrhea.  Pt able to roll and partially bridge w/ RLE for PT to place bedpan.  Pt continent of diarrhea in bedpan and attempts to remove failed as pt again begins to have diarrhea.  Pt able to roll to Left w/ CGA for total A for pericare as well as application of Hydrocortizone creme to buttocks 2/2 irritation.  Clean breif placed 2/2 small stain on brief.  Pt states inability to continue w/ therapy.  Spoke w/ NT and nurse re: same, message left for MD.  Bed  alarm on and all needs in reach.     Therapy Documentation Precautions:  Precautions Precautions: Fall Precaution Comments: cholecystostomy tube; monitor BP (possible ortho hypotension) Restrictions Weight Bearing Restrictions: Yes LLE Weight Bearing: Partial weight bearing LLE Partial Weight Bearing Percentage or Pounds: 50% General:   Vital Signs:   Pain:7/10 L LE, unknown pain meds. Pain Assessment Pain Scale: 0-10 Pain Score: 0-No pain Mobility:     Therapy/Group: Individual Therapy  Lucio Edward 10/21/2020, 9:45 AM

## 2020-10-21 NOTE — Progress Notes (Signed)
Patient has been having loose stools for past few days--has dumping syndrome. Able to eat 1-2 bites before having it go through her. Stools have been orange-yellow liquid with few solid particles. Will order lomotil qid prn. Add tucks and lidocaine jelly prn and schedule anusol HC for rectal irritation.

## 2020-10-21 NOTE — IPOC Note (Signed)
Overall Plan of Care [facility]) Patient Details Name: Heather [name] MRN: [id number] DOB: 1945-03-05  Admitting Diagnosis: Closed bicondylar fracture of distal femur, left, with routine healing, subsequent encounter  Hospital Problems: Principal Problem:   Closed bicondylar fracture of distal femur, left, with routine healing, subsequent encounter Active Problems:   Closed fracture of distal end of femur, unspecified fracture morphology, sequela     Functional Problem List: Nursing Bladder, Endurance, Medication Management, Pain, Safety, Skin Integrity  PT Balance, Endurance, Pain, Safety, Sensory  OT Balance, Endurance, Motor, Pain, Safety  SLP    TR         Basic ADL's: OT Grooming, Bathing, Dressing, Toileting     Advanced  ADL's: OT Simple Meal Preparation     Transfers: PT Bed Mobility, Bed to Chair, Car, Furniture, Floor  OT Toilet, Research scientist (life sciences): PT Ambulation, Psychologist, prison and probation services, Stairs     Additional Impairments: OT None  SLP        TR      Anticipated Outcomes Item Anticipated Outcome  Self Feeding    Swallowing      Basic self-care  Psychiatrist  Supervision with LRAD  Communication     Cognition     Pain  < 4  Safety/Judgment  supervision and no falls   Therapy Plan: PT Intensity: Minimum of 1-2 x/day ,45 to 90 minutes PT Frequency: 5 out of 7 days PT Duration Estimated Length of Stay: 10-12 days OT Intensity: Minimum of 1-2 x/day, 45 to 90 minutes OT Frequency: 5 out of 7 days OT Duration/Estimated Length of Stay: 10-12 days     Due to the current state of emergency, patients may not be receiving their 3-hours of Medicare-mandated therapy.   Team Interventions: Nursing Interventions Patient/Family Education, Bladder Management, Disease Management/Prevention, Pain Management, Medication  Management, Skin Care/Wound Management, Discharge Planning  PT interventions Ambulation/gait training, Balance/vestibular training, Community reintegration, Discharge planning, DME/adaptive equipment instruction, Functional mobility training, Pain management, Patient/family education, Stair training, Therapeutic Activities, Therapeutic Exercise, UE/LE Strength taining/ROM, UE/LE Coordination activities  OT Interventions Warden/ranger, Community reintegration, Discharge planning, Disease mangement/prevention, Fish farm manager, Functional mobility training, Neuromuscular re-education, Pain management, Patient/family education, Psychosocial support, Self Care/advanced ADL retraining, Skin care/wound managment, Therapeutic Activities, Therapeutic Exercise, UE/LE Strength taining/ROM  SLP Interventions    TR Interventions    SW/CM Interventions Discharge Planning, Psychosocial Support, Patient/Family Education, Disease Management/Prevention   Barriers to Discharge MD  Medical stability, Home enviroment access/loayout, Incontinence, Neurogenic bowel and bladder, Wound care, Lack of/limited family support, and Weight bearing restrictions  Nursing Decreased caregiver support, Home environment access/layout, Incontinence, Wound Care, Lack of/limited family support, Weight bearing restrictions, Medication compliance Lives in 1 level home with 2 steps to enter and no rails. Daughter and granddaughter-in-law are to be caregivers at discharge and can provide assist 24/7.  PT Home environment access/layout, Weight bearing restrictions    OT Home environment access/layout 2 steps to enter with no rails  SLP      SW Insurance for SNF coverage, Other (comments) Cholecystostomy, cath?   Team Discharge Planning: Destination: PT-Home ,OT- Home , SLP-  Projected Follow-up: PT-Home health PT, OT-  Home health OT, 24 hour supervision/assistance, SLP-  Projected Equipment Needs:  PT-Rolling walker with 5" wheels, OT- Tub/shower seat, Tub/shower bench (TBD), SLP-  Equipment Details: PT-pt reports she owns RW, OT-  Patient/family involved in  discharge planning: PT- Patient,  OT-Patient, SLP-   MD ELOS: 10-12 days Medical Rehab Prognosis:  Good Assessment: Pt is a 75 yr old female with L distal femur fx s/p ORIF PWB on LLE- also cholecystitis with drain- Some leukocytosis; RA, loose stools- could be Keflex, bowel meds- less likely C Diff due to other issues as above; Ileus has resolved.  Goals supervision if medical issues don't interfere    See Team Conference Notes for weekly updates to the plan of care

## 2020-10-21 NOTE — Progress Notes (Signed)
Occupational Therapy Session Note  Patient Details  Name: Heather [name] MRN: [id number] Date of Birth: 1945-04-22  Today's Date: 10/22/2020 OT Individual Time: 2355-7322  OT Individual Time Calculation (min): 60 min 30 minutes missed  Short Term Goals: Week 1:  OT Short Term Goal 1 (Week 1): Pt will complete ambulatory toilet transfers with min assist OT Short Term Goal 2 (Week 1): Pt will complete LB dressing with min assist with AE as needed OT Short Term Goal 3 (Week 1): Pt will complete 2 grooming tasks in standing to increase activity tolerance/endurance  Skilled Therapeutic Interventions/Progress Updates:    Pt greeted in bed, reporting actively having BM. Not wearing brief or using bedpan. Pt able to roll with CGA in both directions in order for OT to assist with pericare. Pt able to complete frontal hygiene with setup assistance. Note that her nightgown was soiled with BM. Pt was motivated to sit EOB to engage in bathing/dressing tasks. Setup for UB self care, grooming tasks, and also for oral care using rinse. Pt able to utilize seated figure 4 position to wash her Rt foot, apply lotion, and don clean gripper sock. When she leaned forward to wash the Lt foot pt reported bowel incontinence. OT assisted with washing/dressing her foot and then pt returned to bed. Perihygiene completed for the 2nd time with the same assistance. Pt was then assisted in sidelying position. Ice pack provided for the Lt LE. She remained in bed at close of session, all needs within reach and bed alarm set. Tx focus placed on ADL retraining, sitting balance, and functional activity tolerance.  RN informed of pts frequent BMs.   2nd Session 1:1 tx (30 minutes missed) Pt seen for scheduled OT and she still reported having BMs every time she moved. Pt asking to defer therapy at this time until she can participate without experiencing bowel incontinence. Stated that previous therapies were greatly limited by  incontinence. Tx time missed.  Therapy Documentation Precautions:  Precautions Precautions: Fall Precaution Comments: cholecystostomy tube; monitor BP (possible ortho hypotension) Restrictions Weight Bearing Restrictions: Yes LLE Weight Bearing: Partial weight bearing LLE Partial Weight Bearing Percentage or Pounds: 50  Pain: session 1: in Lt LE, ice pack provided at close of session and RN was also in to provide pain medicine. Pain also in buttocks, assisted pt in sidelying position at close of session for pressure relief. Session 2:  Pain Assessment Pain Scale: 0-10 Pain Score: 4  Pain Type: Surgical pain Pain Location: Leg Pain Orientation: Left;Lateral Pain Descriptors / Indicators: Aching Pain Frequency: Intermittent Pain Onset: On-going Patients Stated Pain Goal: 1 Pain Intervention(s): Medication (See eMAR) ADL: ADL Grooming: Setup Where Assessed-Grooming: Sitting at sink Upper Body Bathing: Setup, Supervision/safety Where Assessed-Upper Body Bathing: Sitting at sink Lower Body Bathing: Moderate assistance, Maximal assistance Where Assessed-Lower Body Bathing: Standing at sink, Sitting at sink Upper Body Dressing: Minimal assistance Where Assessed-Upper Body Dressing: Sitting at sink Lower Body Dressing: Not assessed Toileting: Not assessed Toilet Transfer: Minimal assistance Toilet Transfer Method: Stand pivot Toilet Transfer Equipment: Bedside commode     Therapy/Group: Individual Therapy  Damiya Sandefur A Laquinn Shippy 10/22/2020, 12:14 PM

## 2020-10-21 NOTE — Progress Notes (Signed)
Occupational Therapy Session Note  Patient Details  Name: Heather [name] MRN: [id number] Date of Birth: 13-Oct-1945  Today's Date: 10/21/2020 OT Individual Time: 1105-1130 OT Individual Time Calculation (min): 25 min   Short Term Goals: Week 1:  OT Short Term Goal 1 (Week 1): Pt will complete ambulatory toilet transfers with min assist OT Short Term Goal 2 (Week 1): Pt will complete LB dressing with min assist with AE as needed OT Short Term Goal 3 (Week 1): Pt will complete 2 grooming tasks in standing to increase activity tolerance/endurance  Skilled Therapeutic Interventions/Progress Updates:    Pt greeted semi-reclined in bed and agreeable to OT treatment session with encouragement. Pt reports frequent Bms and bottom being very raw. OT offered BADL tasks but pt reported she had just washed up. OT reviewed weight bearing restrictions with pt and pt  agreeable to try to get up. PT completed sit<>stand with RW and min A, then min A to ambulate to the door with RW. Frequent rest breaks 2/2 fatigue. Pt sat EOB and completed UB there-ex using 2 lb dowel rod, chest press, row, and bicep curl. Pt returned to supine with OT assist to lift LLE back into bed. OT applied ice pack to L LE for pain management and pt left with needs met and bed alarm on.   Therapy Documentation Precautions:  Precautions Precautions: Fall Precaution Comments: cholecystostomy tube; monitor BP (possible ortho hypotension) Restrictions Weight Bearing Restrictions: Yes LLE Weight Bearing: Partial weight bearing LLE Partial Weight Bearing Percentage or Pounds: 50%  Pain: 9/10 pain in LLEand painful buttocks from frequent Bms. Rest and repositioned for pain management.   Therapy/Group: Individual Therapy  Valma Cava 10/21/2020, 11:32 AM

## 2020-10-21 NOTE — Progress Notes (Signed)
PROGRESS NOTE   Subjective/Complaints:  Pt reports is raw from so much pooping- and it's painful.  Pain otherwise is well controlled/OK.   Had loose stools for 2-3 days per nursing and was worried about Cdiff, however pt been having a LOT of bowel meds and no abd pain otherwise.  Does have a low grade leukocytosis, and Tm of 99.9- last night- but pt denies abd cramping, no abd pain- just loose (not liquid?) stools.   U/A checked 8/28 - was (-); and no resp issues/Sx's- Dopplers (-)- will recheck CBC in AM, just in case.    ROS:  Pt denies SOB, abd pain, CP, N/V/, and vision changes     Objective:   DG Abd 1 View  Result Date: 10/20/2020 CLINICAL DATA:  Ileus EXAM: ABDOMEN - 1 VIEW COMPARISON:  10/18/2020 FINDINGS: Nonobstructive pattern of bowel gas, with gas present to the rectum. No free air in the abdomen. Pigtail catheter projects in the vicinity of the right kidney. Posterior lumbar fusion. IMPRESSION: Nonobstructive pattern of bowel gas, with gas present to the rectum. No free air in the abdomen. Electronically Signed   By: Lauralyn Primes M.D.   On: 10/20/2020 10:39   VAS [place] LOWER EXTREMITY VENOUS (DVT)  Result Date: 10/19/2020  Lower Venous DVT Study Patient Name:  Heather [name]  Date of Exam:   10/19/2020 Medical Rec #: [id number]               Accession #:    [id number] Date of Birth: 12/12/1945              Patient Gender: F Patient Age:   74 years Exam Location:  [facility] Procedure:      VAS [place] LOWER EXTREMITY VENOUS (DVT) Referring Phys: PAMELA LOVE --------------------------------------------------------------------------------  Indications: Immobility.  Risk Factors: Surgery. Limitations: Poor ultrasound/tissue interface and patient positioning. Comparison Study: No prior studies. Performing Technologist: Chanda Busing RVT  Examination Guidelines: A complete evaluation includes B-mode imaging,  spectral Doppler, color Doppler, and power Doppler as needed of all accessible portions of each vessel. Bilateral testing is considered an integral part of a complete examination. Limited examinations for reoccurring indications may be performed as noted. The reflux portion of the exam is performed with the patient in reverse Trendelenburg.  +---------+---------------+---------+-----------+----------+--------------+ RIGHT    CompressibilityPhasicitySpontaneityPropertiesThrombus Aging +---------+---------------+---------+-----------+----------+--------------+ CFV      Full           Yes      Yes                                 +---------+---------------+---------+-----------+----------+--------------+ SFJ      Full                                                        +---------+---------------+---------+-----------+----------+--------------+ FV Prox  Full                                                        +---------+---------------+---------+-----------+----------+--------------+  FV Mid   Full                                                        +---------+---------------+---------+-----------+----------+--------------+ FV DistalFull                                                        +---------+---------------+---------+-----------+----------+--------------+ PFV      Full                                                        +---------+---------------+---------+-----------+----------+--------------+ POP      Full           Yes      Yes                                 +---------+---------------+---------+-----------+----------+--------------+ PTV      Full                                                        +---------+---------------+---------+-----------+----------+--------------+ PERO     Full                                                        +---------+---------------+---------+-----------+----------+--------------+    +---------+---------------+---------+-----------+----------+-------------------+ LEFT     CompressibilityPhasicitySpontaneityPropertiesThrombus Aging      +---------+---------------+---------+-----------+----------+-------------------+ CFV      Full           Yes      Yes                                      +---------+---------------+---------+-----------+----------+-------------------+ SFJ      Full                                                             +---------+---------------+---------+-----------+----------+-------------------+ FV Prox  Full                                                             +---------+---------------+---------+-----------+----------+-------------------+ FV Mid   Full                                                             +---------+---------------+---------+-----------+----------+-------------------+  FV DistalFull                                                             +---------+---------------+---------+-----------+----------+-------------------+ PFV      Full                                                             +---------+---------------+---------+-----------+----------+-------------------+ POP      Full           Yes      Yes                                      +---------+---------------+---------+-----------+----------+-------------------+ PTV      Full                                                             +---------+---------------+---------+-----------+----------+-------------------+ PERO                                                  Not well visualized +---------+---------------+---------+-----------+----------+-------------------+     Summary: RIGHT: - There is no evidence of deep vein thrombosis in the lower extremity.  - No cystic structure found in the popliteal fossa.  LEFT: - There is no evidence of deep vein thrombosis in the lower extremity. However, portions of this  examination were limited- see technologist comments above.  - No cystic structure found in the popliteal fossa.  *See table(s) above for measurements and observations. Electronically signed by Coral Else MD on 10/19/2020 at 6:28:49 PM.    Final    Recent Labs    10/19/20 0500 10/21/20 0446  WBC 12.0* 12.3*  HGB 9.9* 10.0*  HCT 30.7* 31.1*  PLT 455* 521*   Recent Labs    10/19/20 0500 10/21/20 0446  NA 138 137  K 4.2 4.4  CL 105 102  CO2 27 26  GLUCOSE 105* 105*  BUN 9 11  CREATININE 0.81 0.88  CALCIUM 8.7* 8.6*    Intake/Output Summary (Last 24 hours) at 10/21/2020 0831 Last data filed at 10/21/2020 0724 Gross per 24 hour  Intake 1200 ml  Output 225 ml  Net 975 ml        Physical Exam: Vital Signs Blood pressure 120/60, pulse 81, temperature 98.8 F (37.1 C), temperature source Oral, resp. rate 18, height 5\' 6"  (1.676 m), weight 71.3 kg, SpO2 93 %.    General: awake, alert, appropriate, laying on R side slightly;  staring at door and on bedpan; per pt having BM; NAD HENT: conjugate gaze; oropharynx moist CV: regular rate; no JVD Pulmonary: CTA B/L; no W/R/R- good air movement GI: soft, NT, ND, (+)BS; hyperactive Psychiatric: appropriate Neurological: Ox3- remembered me  and medical issues today. More awake.    Musculoskeletal:     Cervical back: Normal range of motion. No rigidity.     Comments: Left thigh incisions C/D/I with skin glue. Incisions looks great on L distal thigh Strength 5-/5 in UE B/L as well as RLE 5-/5; LLE- 4/5 HF/KE/KF and 5-/5 DF and PF  Skin:    General: Skin is warm and dry.     Comments: L thigh incision as above  Neurological:     Mental Status: She is alert and oriented to person, place, and time.    Assessment/Plan: 1. Functional deficits which require 3+ hours per day of interdisciplinary therapy in a comprehensive inpatient rehab setting. Physiatrist is providing close team supervision and 24 hour management of active medical  problems listed below. Physiatrist and rehab team continue to assess barriers to discharge/monitor patient progress toward functional and medical goals  Care Tool:  Bathing    Body parts bathed by patient: Right arm, Left arm, Chest, Abdomen, Face   Body parts bathed by helper: Buttocks, Right upper leg, Left upper leg, Right lower leg, Left lower leg, Front perineal area     Bathing assist Assist Level: Maximal Assistance - Patient 24 - 49%     Upper Body Dressing/Undressing Upper body dressing   What is the patient wearing?: Hospital gown only    Upper body assist Assist Level: Minimal Assistance - Patient > 75%    Lower Body Dressing/Undressing Lower body dressing      What is the patient wearing?: Underwear/pull up     Lower body assist Assist for lower body dressing: Maximal Assistance - Patient 25 - 49%     Toileting Toileting    Toileting assist Assist for toileting: Moderate Assistance - Patient 50 - 74%     Transfers Chair/bed transfer  Transfers assist     Chair/bed transfer assist level: Contact Guard/Touching assist     Locomotion Ambulation   Ambulation assist      Assist level: Contact Guard/Touching assist Assistive device: Walker-rolling Max distance: 20 ft   Walk 10 feet activity   Assist  Walk 10 feet activity did not occur: Safety/medical concerns    Assistive device: Walker-rolling   Walk 50 feet activity   Assist Walk 50 feet with 2 turns activity did not occur: Safety/medical concerns         Walk 150 feet activity   Assist Walk 150 feet activity did not occur: Safety/medical concerns         Walk 10 feet on uneven surface  activity   Assist Walk 10 feet on uneven surfaces activity did not occur: Safety/medical concerns         Wheelchair     Assist Is the patient using a wheelchair?: Yes Type of Wheelchair: Manual    Wheelchair assist level: Supervision/Verbal cueing Max wheelchair  distance: 100 ft    Wheelchair 50 feet with 2 turns activity    Assist            Wheelchair 150 feet activity     Assist          Blood pressure 120/60, pulse 81, temperature 98.8 F (37.1 C), temperature source Oral, resp. rate 18, height  (1.676 m), weight 71.3 kg, SpO2 93 %.  Medical Poblem List and Plan: 1. L distal femur fx s/p ORIF secondary to multiple falls- with complicated medical issues- PWB on LLE             -  patient may not shower until abd drain removed?             -ELOS/Goals: 10-14 days- supervision  Con't PT and OT- CIR 2.  Antithrombotics: -DVT/anticoagulation:  Pharmaceutical: Lovenox             -antiplatelet therapy: ASA 3. Pain Management:  Hydrocodone prn.   9/2- pain "OK"- doing well- con't regimen 4. Mood: LCSW to follow for evaluation and support.              -antipsychotic agents: N/A 5. Neuropsych: This patient is capable of making decisions on her own behalf. 6. Skin/Wound Care: Monitor incision for healing.  7. Fluids/Electrolytes/Nutrition: Monitor I/O. Check lytes in am.             --Hypophosphatemia/hypomagnesemia improving post supplement.             --add juven for low protein stores.  8. Left distal femur Fx s/p ORIF: PWB LLE--continue to reinforce precautions 9. 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 Episode of SVT: Treated with IV BB--->po Toprol 11. Sepsis due to cholecystitis: Perc drain in place with Keflex/Flagyl thorough 09/02             --Leucocytosis continues to fluctuate  9/1- WBC 12k- will recheck in AM- is afebrile and feeling BETTER- so will wait til CBC for AM done.   9/2- WBC is 12.3k- Tm 99.9- no abd pain; U/A (-) 8/28- but on Keflex til today for skin- Hip incision looks OK- Dopplers (-)- no resp Sx's; don't think C diff since on Miralax BID and Colace BID and just had ileus- will recheck labs  in AM and see if reducing bowel meds helps bowel issues?Likely cholecystitis- but looks nontoxic- might need Ct of abd/pelvis Saturday if WBC goes up.  12. Acute renal failure: SCr 2.21 at admission and has resolved-->0.75 9/1- Cr 0.81- stable- con't to monitor  13. Acute blood loss anemia: Continue to monitor H/H.  14. 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   15. Chronic insomnia: Continue Trazodone at bedtime.   9/2- sleeping well- con't regimen 16. OSA: Resume CPAP. 17. Abdominal pain: Has had diarrhea X 3 today--question due to diet v/s issues with drain              --Monitor for now. Advised on avoiding fatty foods. 9/1- see # 18 9/2- Abd pain resolved, however WBC 12.3- might need CT of abd and pelvis if WBC spikes/goes up more tomorrow. 18. Ileus 9/1- got cleaned out yesterday per staff- on liquid diet-  4 stools since yesterday AM- 1 formed; 3 liquid- KUB pending for this AM. Won't change liquid diet until know ileus has improved.   9/2-resolved- now with loose stools.        LOS: 3 days A FACE TO FACE EVALUATION WAS PERFORMED  Heather [name] 10/21/2020, 8:31 AM

## 2020-10-22 LAB — CBC WITH DIFFERENTIAL/PLATELET
Abs Immature Granulocytes: 0.07 10*3/uL (ref 0.00–0.07)
Basophils Absolute: 0 10*3/uL (ref 0.0–0.1)
Basophils Relative: 0 %
Eosinophils Absolute: 0.2 10*3/uL (ref 0.0–0.5)
Eosinophils Relative: 2 %
HCT: 32.6 % — ABNORMAL LOW (ref 36.0–46.0)
Hemoglobin: 10.3 g/dL — ABNORMAL LOW (ref 12.0–15.0)
Immature Granulocytes: 1 %
Lymphocytes Relative: 22 %
Lymphs Abs: 2.2 10*3/uL (ref 0.7–4.0)
MCH: 27.2 pg (ref 26.0–34.0)
MCHC: 31.6 g/dL (ref 30.0–36.0)
MCV: 86 fL (ref 80.0–100.0)
Monocytes Absolute: 0.8 10*3/uL (ref 0.1–1.0)
Monocytes Relative: 8 %
Neutro Abs: 6.9 10*3/uL (ref 1.7–7.7)
Neutrophils Relative %: 67 %
Platelets: 519 10*3/uL — ABNORMAL HIGH (ref 150–400)
RBC: 3.79 MIL/uL — ABNORMAL LOW (ref 3.87–5.11)
RDW: 19.3 % — ABNORMAL HIGH (ref 11.5–15.5)
WBC: 10.1 10*3/uL (ref 4.0–10.5)
nRBC: 0 % (ref 0.0–0.2)

## 2020-10-22 LAB — BASIC METABOLIC PANEL
Anion gap: 8 (ref 5–15)
BUN: 9 mg/dL (ref 8–23)
CO2: 22 mmol/L (ref 22–32)
Calcium: 8.2 mg/dL — ABNORMAL LOW (ref 8.9–10.3)
Chloride: 106 mmol/L (ref 98–111)
Creatinine, Ser: 0.76 mg/dL (ref 0.44–1.00)
GFR, Estimated: >60 mL/min (ref >60)
Glucose, Bld: 119 mg/dL — ABNORMAL HIGH (ref 70–99)
Potassium: 4.1 mmol/L (ref 3.5–5.1)
Sodium: 136 mmol/L (ref 135–145)

## 2020-10-22 MED ORDER — LOPERAMIDE HCL 2 MG PO CAPS
2.0000 mg | ORAL_CAPSULE | Freq: Once | ORAL | Status: AC
  Administered 2020-10-22: 2 mg via ORAL
  Filled 2020-10-22: qty 1

## 2020-10-22 NOTE — Progress Notes (Addendum)
Physical Therapy Session Note  Patient Details  Name: Heather [name] MRN: [id number] Date of Birth: [birth year]  Today's Date: 10/22/2020 PT Individual Time: 1110-1205 PT Individual Time Calculation (min): 55 min   Short Term Goals: Week 1:  PT Short Term Goal 1 (Week 1): Pt will perform least restrictive transfer with min A consistently PT Short Term Goal 2 (Week 1): Pt will ambulate x 50 ft with LRAD and min A PT Short Term Goal 3 (Week 1): Pt will initiate stair training  Skilled Therapeutic Interventions/Progress Updates:  Pt received supine in bed and agreeable to therapy, although pt stated that she still has diarrhea and would prefer to stay in the room for session. Thus, session plan was to focus on functional transfers. However, when pt attempted sitting EOB, she reported urgency for a BM. Pt was placed on bedpan and had extensive BM. RN notified. PT assisted pt with cleaning. During cleaning, pt able to roll R and L with Mod I using bed rails. Pt noted to have extreme tenderness during cleaning. Once clean, pt able to perform bed exercises including SLR and resisted supine leg press for 2 x10. Pt c/o pulling feeling in R flank and was noted to have a tight pull at stitching. Thus, RN notified. Pt then left supine in bed in care of RN.  Therapy Documentation Precautions:  Precautions Precautions: Fall Precaution Comments: cholecystostomy tube; monitor BP (possible ortho hypotension) Restrictions Weight Bearing Restrictions: Yes LLE Weight Bearing: Partial weight bearing LLE Partial Weight Bearing Percentage or Pounds: 50  Pain: Pain Assessment Pain Scale: 0-10 Pain Score: 0-No pain    Therapy/Group: Individual Therapy  Perrin Maltese, PT, DPT 10/22/2020, 3:36 PM

## 2020-10-22 NOTE — Progress Notes (Signed)
PROGRESS NOTE   Subjective/Complaints: Butt raw from diarrhea- additional imodium ordered once and she has lomotil PRN as well. She requests Reglan be stopped as she feels it is contributing to her diarrhea    ROS:  Pt denies SOB, abd pain, CP, N/V/, and vision changes, +diarrhea     Objective:   No results found. Recent Labs    10/21/20 0446 10/22/20 0519  WBC 12.3* 10.1  HGB 10.0* 10.3*  HCT 31.1* 32.6*  PLT 521* 519*   Recent Labs    10/21/20 0446 10/22/20 0519  NA 137 136  K 4.4 4.1  CL 102 106  CO2 26 22  GLUCOSE 105* 119*  BUN 11 9  CREATININE 0.88 0.76  CALCIUM 8.6* 8.2*    Intake/Output Summary (Last 24 hours) at 10/22/2020 1624 Last data filed at 10/22/2020 1435 Gross per 24 hour  Intake 360 ml  Output 551 ml  Net -191 ml        Physical Exam: Vital Signs Blood pressure 125/73, pulse 82, temperature 98.3 F (36.8 C), resp. rate 15, height 5\' 6"  (1.676 m), weight 70.2 kg, SpO2 98 %. Gen: no distress, normal appearing HEENT: oral mucosa pink and moist, NCAT Cardio: Reg rate Chest: normal effort, normal rate of breathing Abd: soft, non-distended Ext: no edema Psych: pleasant, normal affect   Musculoskeletal:     Cervical back: Normal range of motion. No rigidity.     Comments: Left thigh incisions C/D/I with skin glue. Incisions looks great on L distal thigh Strength 5-/5 in UE B/L as well as RLE 5-/5; LLE- 4/5 HF/KE/KF and 5-/5 DF and PF  Skin:    General: Skin is warm and dry.     Comments: L thigh incision as above  Neurological:     Mental Status: She is alert and oriented to person, place, and time.    Assessment/Plan: 1. Functional deficits which require 3+ hours per day of interdisciplinary therapy in a comprehensive inpatient rehab setting. Physiatrist is providing close team supervision and 24 hour management of active medical problems listed below. Physiatrist and rehab  team continue to assess barriers to discharge/monitor patient progress toward functional and medical goals  Care Tool:  Bathing    Body parts bathed by patient: Right arm, Left arm, Chest, Abdomen, Face, Front perineal area, Right upper leg, Left upper leg, Right lower leg   Body parts bathed by helper: Buttocks, Left lower leg     Bathing assist Assist Level: Minimal Assistance - Patient > 75%     Upper Body Dressing/Undressing Upper body dressing   What is the patient wearing?: Hospital gown only    Upper body assist Assist Level: Minimal Assistance - Patient > 75%    Lower Body Dressing/Undressing Lower body dressing      What is the patient wearing?: Underwear/pull up     Lower body assist Assist for lower body dressing: Maximal Assistance - Patient 25 - 49%     Toileting Toileting    Toileting assist Assist for toileting: Moderate Assistance - Patient 50 - 74%     Transfers Chair/bed transfer  Transfers assist     Chair/bed transfer assist level:  Contact Guard/Touching assist     Locomotion Ambulation   Ambulation assist      Assist level: Contact Guard/Touching assist Assistive device: Walker-rolling Max distance: 20 ft   Walk 10 feet activity   Assist  Walk 10 feet activity did not occur: Safety/medical concerns    Assistive device: Walker-rolling   Walk 50 feet activity   Assist Walk 50 feet with 2 turns activity did not occur: Safety/medical concerns         Walk 150 feet activity   Assist Walk 150 feet activity did not occur: Safety/medical concerns         Walk 10 feet on uneven surface  activity   Assist Walk 10 feet on uneven surfaces activity did not occur: Safety/medical concerns         Wheelchair     Assist Is the patient using a wheelchair?: Yes Type of Wheelchair: Manual    Wheelchair assist level: Supervision/Verbal cueing Max wheelchair distance: 100 ft    Wheelchair 50 feet with 2 turns  activity    Assist            Wheelchair 150 feet activity     Assist          Blood pressure 125/73, pulse 82, temperature 98.3 F (36.8 C), resp. rate 15, height 5\' 6"  (1.676 m), weight 70.2 kg, SpO2 98 %.  Medical Poblem List and Plan: 1. L distal femur fx s/p ORIF secondary to multiple falls- with complicated medical issues- PWB on LLE             -patient may not shower until abd drain removed?             -ELOS/Goals: 10-14 days- supervision  Continue PT and OT- CIR 2.  Impaired mobility: -DVT/anticoagulation:  Pharmaceutical: Continue Lovenox             -antiplatelet therapy: ASA 3. Pain: continue Hydrocodone prn.   9/3- pain "OK"- doing well- con't regimen 4. Mood: LCSW to follow for evaluation and support.              -antipsychotic agents: N/A 5. Neuropsych: This patient is capable of making decisions on her own behalf. 6. Skin/Wound Care: Monitor incision for healing.  7. Fluids/Electrolytes/Nutrition: Monitor I/O. Check lytes in am.             --Hypophosphatemia/hypomagnesemia improving post supplement.             --add juven for low protein stores.  8. Left distal femur Fx s/p ORIF: PWB LLE--continue to reinforce precautions 9. 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 Episode of SVT: Treated with IV BB--->po Toprol 11. Sepsis due to cholecystitis: Perc drain in place with Keflex/Flagyl thorough 09/02             --Leucocytosis continues to fluctuate  9/1- WBC 12k- will recheck in AM- is afebrile and feeling BETTER- so will wait til CBC for AM done.   9/2- WBC is 12.3k- Tm 99.9- no abd pain; U/A (-) 8/28- but on Keflex til today for skin- Hip incision looks OK- Dopplers (-)- no resp Sx's; don't think C diff since on Miralax BID and Colace BID and just had ileus- will recheck labs in AM and see if reducing bowel meds helps bowel issues?Likely  cholecystitis- but looks nontoxic- might need Ct of abd/pelvis  Saturday if WBC goes up.  12. Acute renal failure: SCr 2.21 at admission and has resolved-->0.75 9/1- Cr 0.81- stable- con't to monitor  13. Acute blood loss anemia: Continue to monitor H/H.  14. 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   15. Chronic insomnia: Continue Trazodone at bedtime.   9/2- sleeping well- con't regimen 16. OSA: Resume CPAP. 17. Abdominal pain: Has had diarrhea X 3 today--question due to diet v/s issues with drain              --Monitor for now. Advised on avoiding fatty foods. 9/1- see # 18 9/2- Abd pain resolved, however WBC 12.3- might need CT of abd and pelvis if WBC spikes/goes up more tomorrow. 18. Ileus 9/1- got cleaned out yesterday per staff- on liquid diet-  4 stools since yesterday AM- 1 formed; 3 liquid- KUB pending for this AM. Won't change liquid diet until know ileus has improved.   9/2-resolved- now with loose stools.   9/3: feels the reglan is contributing to loose stools and requests stopping it. Lamotil PRN ordered for loose stools      LOS: 4 days A FACE TO FACE EVALUATION WAS PERFORMED  Drema Pry Giani Betzold 10/22/2020, 4:24 PM

## 2020-10-22 NOTE — Progress Notes (Signed)
Nurse spoke with pt r/t continued diarrhea. Updated pt on PRN lomotil order and dispensed dose, also updated on Reglan d/c'ed. Nurse contacted daughter with update on med changes.

## 2020-10-22 NOTE — Progress Notes (Signed)
Physical Therapy Session Note  Patient Details  Name: Heather [name] MRN: [id number] Date of Birth: 1945-11-26  Today's Date: 10/22/2020 PT Individual Time: 1110-1205 PT Individual Time Calculation (min): 55 min   Short Term Goals: Week 1:  PT Short Term Goal 1 (Week 1): Pt will perform least restrictive transfer with min A consistently PT Short Term Goal 2 (Week 1): Pt will ambulate x 50 ft with LRAD and min A PT Short Term Goal 3 (Week 1): Pt will initiate stair training  Skilled Therapeutic Interventions/Progress Updates:    Pt received supine in bed and agreeable to therapy but requesting for bed level tasks due to stating she is still having bowel incontinence "every time I move." Nurse in/out for medication administration.  Performed the following supine exercises  2x20reps each unless otherwise stated  - L LE heel slides targeting hip flexor strength and knee flexion ROM  Pt able to achieve 73degrees of active L knee flexion ROM in supine - L LE straight leg raise; able to sustain quad contraction with hip flexion up to ~35degrees - L LE hip abduction/adduction  - R LE single leg bridging; able to clear hips but limited - R sidelying L LE clamshells through very slight ROM - R sidelying L hip flexor stretch x1 minute  Therapist cuing throughout for proper form/technique of exercises. 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   Supine<>sit 2x during session with supervision not using bedrails but pt using UEs to assist with LE management as needed.   Sitting EOB performed 2x20reps of: - L LE knee flexion AROM, pt with increased discomfort with knee flexion in this position - L LE long arc quads   At end of session pt left seated EOB with needs in reach and bed alarm on in preparation for meal.  Therapy Documentation Precautions:  Precautions Precautions: Fall Precaution Comments: cholecystostomy tube; monitor BP (possible ortho  hypotension) Restrictions Weight Bearing Restrictions: Yes LLE Weight Bearing: Partial weight bearing LLE Partial Weight Bearing Percentage or Pounds: 50   Pain:  Reports 0/10 at beginning of session and then half way through exercises had increased to 7/10 - premedicated - provided rest breaks, distraction, emotional support, and repositioning for pain management.    Therapy/Group: Individual Therapy  [name] , PT, DPT, NCS, CSRS 10/22/2020, 8:00 AM

## 2020-10-22 NOTE — Progress Notes (Signed)
Ptp c/o diarrhea x 4. Nurse updated MD, Imodium ordered and given to pt.

## 2020-10-23 NOTE — Progress Notes (Signed)
PROGRESS NOTE   Subjective/Complaints: Diarrhea is bothersome to her- has been using PRN Lomotil and requested that Reglan be discontinued No other issues overnight Vital signs stable   ROS:  Pt denies SOB, abd pain, CP, N/V/, and vision changes, +diarrhea- improving     Objective:   No results found. Recent Labs    10/21/20 0446 10/22/20 0519  WBC 12.3* 10.1  HGB 10.0* 10.3*  HCT 31.1* 32.6*  PLT 521* 519*   Recent Labs    10/21/20 0446 10/22/20 0519  NA 137 136  K 4.4 4.1  CL 102 106  CO2 26 22  GLUCOSE 105* 119*  BUN 11 9  CREATININE 0.88 0.76  CALCIUM 8.6* 8.2*    Intake/Output Summary (Last 24 hours) at 10/23/2020 1045 Last data filed at 10/23/2020 0600 Gross per 24 hour  Intake 240 ml  Output 500 ml  Net -260 ml        Physical Exam: Vital Signs Blood pressure 120/64, pulse 85, temperature 98 F (36.7 C), resp. rate 16, height 5\' 6"  (1.676 m), weight 70.2 kg, SpO2 96 %. Gen: no distress, normal appearing HEENT: oral mucosa pink and moist, NCAT Cardio: Reg rate Chest: normal effort, normal rate of breathing Abd: soft, non-distended Ext: no edema Psych: pleasant, normal affect  Musculoskeletal:     Cervical back: Normal range of motion. No rigidity.     Comments: Left thigh incisions C/D/I with skin glue. Incisions looks great on L distal thigh Strength 5-/5 in UE B/L as well as RLE 5-/5; LLE- 4/5 HF/KE/KF and 5-/5 DF and PF  Skin:    General: Skin is warm and dry.     Comments: L thigh incision as above  Neurological:     Mental Status: She is alert and oriented to person, place, and time.    Assessment/Plan: 1. Functional deficits which require 3+ hours per day of interdisciplinary therapy in a comprehensive inpatient rehab setting. Physiatrist is providing close team supervision and 24 hour management of active medical problems listed below. Physiatrist and rehab team continue to  assess barriers to discharge/monitor patient progress toward functional and medical goals  Care Tool:  Bathing    Body parts bathed by patient: Right arm, Left arm, Chest, Abdomen, Face, Front perineal area, Right upper leg, Left upper leg, Right lower leg   Body parts bathed by helper: Buttocks, Left lower leg     Bathing assist Assist Level: Minimal Assistance - Patient > 75%     Upper Body Dressing/Undressing Upper body dressing   What is the patient wearing?: Hospital gown only    Upper body assist Assist Level: Minimal Assistance - Patient > 75%    Lower Body Dressing/Undressing Lower body dressing      What is the patient wearing?: Underwear/pull up     Lower body assist Assist for lower body dressing: Maximal Assistance - Patient 25 - 49%     Toileting Toileting    Toileting assist Assist for toileting: Moderate Assistance - Patient 50 - 74%     Transfers Chair/bed transfer  Transfers assist     Chair/bed transfer assist level: Contact Guard/Touching assist  Locomotion Ambulation   Ambulation assist      Assist level: Contact Guard/Touching assist Assistive device: Walker-rolling Max distance: 20 ft   Walk 10 feet activity   Assist  Walk 10 feet activity did not occur: Safety/medical concerns    Assistive device: Walker-rolling   Walk 50 feet activity   Assist Walk 50 feet with 2 turns activity did not occur: Safety/medical concerns         Walk 150 feet activity   Assist Walk 150 feet activity did not occur: Safety/medical concerns         Walk 10 feet on uneven surface  activity   Assist Walk 10 feet on uneven surfaces activity did not occur: Safety/medical concerns         Wheelchair     Assist Is the patient using a wheelchair?: Yes Type of Wheelchair: Manual    Wheelchair assist level: Supervision/Verbal cueing Max wheelchair distance: 100 ft    Wheelchair 50 feet with 2 turns  activity    Assist            Wheelchair 150 feet activity     Assist          Blood pressure 120/64, pulse 85, temperature 98 F (36.7 C), resp. rate 16, height 5\' 6"  (1.676 m), weight 70.2 kg, SpO2 96 %.  Medical Poblem List and Plan: 1. L distal femur fx s/p ORIF secondary to multiple falls- with complicated medical issues- PWB on LLE             -patient may not shower until abd drain removed?             -ELOS/Goals: 10-14 days- supervision  Continue PT and OT- CIR 2.  Impaired mobility: -DVT/anticoagulation:  Pharmaceutical: Continue Lovenox             -antiplatelet therapy: ASA 3. Pain: continue  Hydrocodone prn.   9/4- pain "OK"- doing well- con't regimen 4. Mood: LCSW to follow for evaluation and support.              -antipsychotic agents: N/A 5. Neuropsych: This patient is capable of making decisions on her own behalf. 6. Skin/Wound Care: Monitor incision for healing.  7. Fluids/Electrolytes/Nutrition: Monitor I/O.              --Hypophosphatemia/hypomagnesemia improving post supplement.             --add juven for low protein stores.  8. Left distal femur Fx s/p ORIF: PWB LLE--continue to reinforce precautions 9. 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 Episode of SVT: Treated with IV BB--->po Toprol 11. Sepsis due to cholecystitis: Perc drain in place with Keflex/Flagyl thorough 09/02             --Leucocytosis continues to fluctuate  9/1- WBC 12k- will recheck in AM- is afebrile and feeling BETTER- so will wait til CBC for AM done.   9/2- WBC is 12.3k- Tm 99.9- no abd pain; U/A (-) 8/28- but on Keflex til today for skin- Hip incision looks OK- Dopplers (-)- no resp Sx's; don't think C diff since on Miralax BID and Colace BID and just had ileus- will recheck labs in AM and see if reducing bowel meds helps bowel issues?Likely cholecystitis- but  looks nontoxic- might need Ct of abd/pelvis Saturday if WBC goes up.   9/3: WBC  down to 10 12. Acute renal failure: SCr 2.21 at admission and has resolved-->0.75 9/1- Cr 0.81- stable- con't to monitor  13. Acute blood loss anemia: Continue to monitor H/H.  14. 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   15. Chronic insomnia: Continue Trazodone at bedtime.   9/2- sleeping well- con't regimen 16. OSA: Resume CPAP. 17. Abdominal pain: Has had diarrhea X 3 today--question due to diet v/s issues with drain              --Monitor for now. Advised on avoiding fatty foods. 9/1- see # 18 9/2- Abd pain resolved, however WBC 12.3- might need CT of abd and pelvis if WBC spikes/goes up more tomorrow. 9/4: WBC down to 10- denies any more abdominal pain 18. Ileus 9/1- got cleaned out yesterday per staff- on liquid diet-  4 stools since yesterday AM- 1 formed; 3 liquid- KUB pending for this AM. Won't change liquid diet until know ileus has improved.   9/2-resolved- now with loose stools.   9/3: feels the reglan is contributing to loose stools and requests stopping it. Lamotil PRN ordered for loose stools      LOS: 5 days A FACE TO FACE EVALUATION WAS PERFORMED  Heather [name] P Heather [name] 10/23/2020, 10:45 AM

## 2020-10-23 NOTE — Progress Notes (Signed)
PROGRESS NOTE   Subjective/Complaints: Diarrhea is bothersome to her- has been using PRN Lomotil and requested that Reglan be discontinued No other issues overnight Vital signs stable   ROS:  Pt denies SOB, abd pain, CP, N/V/, and vision changes, +diarrhea     Objective:   No results found. Recent Labs    10/21/20 0446 10/22/20 0519  WBC 12.3* 10.1  HGB 10.0* 10.3*  HCT 31.1* 32.6*  PLT 521* 519*   Recent Labs    10/21/20 0446 10/22/20 0519  NA 137 136  K 4.4 4.1  CL 102 106  CO2 26 22  GLUCOSE 105* 119*  BUN 11 9  CREATININE 0.88 0.76  CALCIUM 8.6* 8.2*    Intake/Output Summary (Last 24 hours) at 10/23/2020 0830 Last data filed at 10/23/2020 0600 Gross per 24 hour  Intake 240 ml  Output 500 ml  Net -260 ml        Physical Exam: Vital Signs Blood pressure 120/64, pulse 85, temperature 98 F (36.7 C), resp. rate 16, height 5\' 6"  (1.676 m), weight 70.2 kg, SpO2 96 %. Gen: no distress, normal appearing HEENT: oral mucosa pink and moist, NCAT Cardio: Reg rate Chest: normal effort, normal rate of breathing Abd: soft, non-distended Ext: no edema Psych: pleasant, normal affect  Musculoskeletal:     Cervical back: Normal range of motion. No rigidity.     Comments: Left thigh incisions C/D/I with skin glue. Incisions looks great on L distal thigh Strength 5-/5 in UE B/L as well as RLE 5-/5; LLE- 4/5 HF/KE/KF and 5-/5 DF and PF  Skin:    General: Skin is warm and dry.     Comments: L thigh incision as above  Neurological:     Mental Status: She is alert and oriented to person, place, and time.    Assessment/Plan: 1. Functional deficits which require 3+ hours per day of interdisciplinary therapy in a comprehensive inpatient rehab setting. Physiatrist is providing close team supervision and 24 hour management of active medical problems listed below. Physiatrist and rehab team continue to assess  barriers to discharge/monitor patient progress toward functional and medical goals  Care Tool:  Bathing    Body parts bathed by patient: Right arm, Left arm, Chest, Abdomen, Face, Front perineal area, Right upper leg, Left upper leg, Right lower leg   Body parts bathed by helper: Buttocks, Left lower leg     Bathing assist Assist Level: Minimal Assistance - Patient > 75%     Upper Body Dressing/Undressing Upper body dressing   What is the patient wearing?: Hospital gown only    Upper body assist Assist Level: Minimal Assistance - Patient > 75%    Lower Body Dressing/Undressing Lower body dressing      What is the patient wearing?: Underwear/pull up     Lower body assist Assist for lower body dressing: Maximal Assistance - Patient 25 - 49%     Toileting Toileting    Toileting assist Assist for toileting: Moderate Assistance - Patient 50 - 74%     Transfers Chair/bed transfer  Transfers assist     Chair/bed transfer assist level: Contact Guard/Touching assist     Locomotion  Ambulation   Ambulation assist      Assist level: Contact Guard/Touching assist Assistive device: Walker-rolling Max distance: 20 ft   Walk 10 feet activity   Assist  Walk 10 feet activity did not occur: Safety/medical concerns    Assistive device: Walker-rolling   Walk 50 feet activity   Assist Walk 50 feet with 2 turns activity did not occur: Safety/medical concerns         Walk 150 feet activity   Assist Walk 150 feet activity did not occur: Safety/medical concerns         Walk 10 feet on uneven surface  activity   Assist Walk 10 feet on uneven surfaces activity did not occur: Safety/medical concerns         Wheelchair     Assist Is the patient using a wheelchair?: Yes Type of Wheelchair: Manual    Wheelchair assist level: Supervision/Verbal cueing Max wheelchair distance: 100 ft    Wheelchair 50 feet with 2 turns activity    Assist             Wheelchair 150 feet activity     Assist          Blood pressure 120/64, pulse 85, temperature 98 F (36.7 C), resp. rate 16, height 5\' 6"  (1.676 m), weight 70.2 kg, SpO2 96 %.  Medical Poblem List and Plan: 1. L distal femur fx s/p ORIF secondary to multiple falls- with complicated medical issues- PWB on LLE             -patient may not shower until abd drain removed?             -ELOS/Goals: 10-14 days- supervision  Continue PT and OT- CIR 2.  Impaired mobility: -DVT/anticoagulation:  Pharmaceutical: Continue Lovenox             -antiplatelet therapy: ASA 3. Pain: continue  Hydrocodone prn.   9/4- pain "OK"- doing well- con't regimen 4. Mood: LCSW to follow for evaluation and support.              -antipsychotic agents: N/A 5. Neuropsych: This patient is capable of making decisions on her own behalf. 6. Skin/Wound Care: Monitor incision for healing.  7. Fluids/Electrolytes/Nutrition: Monitor I/O.              --Hypophosphatemia/hypomagnesemia improving post supplement.             --add juven for low protein stores.  8. Left distal femur Fx s/p ORIF: PWB LLE--continue to reinforce precautions 9. 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 Episode of SVT: Treated with IV BB--->po Toprol 11. Sepsis due to cholecystitis: Perc drain in place with Keflex/Flagyl thorough 09/02             --Leucocytosis continues to fluctuate  9/1- WBC 12k- will recheck in AM- is afebrile and feeling BETTER- so will wait til CBC for AM done.   9/2- WBC is 12.3k- Tm 99.9- no abd pain; U/A (-) 8/28- but on Keflex til today for skin- Hip incision looks OK- Dopplers (-)- no resp Sx's; don't think C diff since on Miralax BID and Colace BID and just had ileus- will recheck labs in AM and see if reducing bowel meds helps bowel issues?Likely cholecystitis- but looks nontoxic- might need Ct  of abd/pelvis Saturday if WBC goes up.   9/3: WBC down  to 10 12. Acute renal failure: SCr 2.21 at admission and has resolved-->0.75 9/1- Cr 0.81- stable- con't to monitor  13. Acute blood loss anemia: Continue to monitor H/H.  14. 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   15. Chronic insomnia: Continue Trazodone at bedtime.   9/2- sleeping well- con't regimen 16. OSA: Resume CPAP. 17. Abdominal pain: Has had diarrhea X 3 today--question due to diet v/s issues with drain              --Monitor for now. Advised on avoiding fatty foods. 9/1- see # 18 9/2- Abd pain resolved, however WBC 12.3- might need CT of abd and pelvis if WBC spikes/goes up more tomorrow. 9/3: WBC down to 10 18. Ileus 9/1- got cleaned out yesterday per staff- on liquid diet-  4 stools since yesterday AM- 1 formed; 3 liquid- KUB pending for this AM. Won't change liquid diet until know ileus has improved.   9/2-resolved- now with loose stools.   9/3: feels the reglan is contributing to loose stools and requests stopping it. Lamotil PRN ordered for loose stools      LOS: 5 days A FACE TO FACE EVALUATION WAS PERFORMED  Carvell Hoeffner P Moni Rothrock 10/23/2020, 8:30 AM

## 2020-10-24 LAB — CBC
HCT: 36.7 % (ref 36.0–46.0)
Hemoglobin: 11.5 g/dL — ABNORMAL LOW (ref 12.0–15.0)
MCH: 26.8 pg (ref 26.0–34.0)
MCHC: 31.3 g/dL (ref 30.0–36.0)
MCV: 85.5 fL (ref 80.0–100.0)
Platelets: 603 10*3/uL — ABNORMAL HIGH (ref 150–400)
RBC: 4.29 MIL/uL (ref 3.87–5.11)
RDW: 19 % — ABNORMAL HIGH (ref 11.5–15.5)
WBC: 13 10*3/uL — ABNORMAL HIGH (ref 4.0–10.5)
nRBC: 0 % (ref 0.0–0.2)

## 2020-10-24 LAB — BASIC METABOLIC PANEL
Anion gap: 11 (ref 5–15)
BUN: 8 mg/dL (ref 8–23)
CO2: 22 mmol/L (ref 22–32)
Calcium: 8.9 mg/dL (ref 8.9–10.3)
Chloride: 103 mmol/L (ref 98–111)
Creatinine, Ser: 0.72 mg/dL (ref 0.44–1.00)
GFR, Estimated: >60 mL/min (ref >60)
Glucose, Bld: 103 mg/dL — ABNORMAL HIGH (ref 70–99)
Potassium: 4.8 mmol/L (ref 3.5–5.1)
Sodium: 136 mmol/L (ref 135–145)

## 2020-10-24 NOTE — Progress Notes (Signed)
PROGRESS NOTE   Subjective/Complaints:  Pt reports feeling better- diarrhea is better, however still raw in anal area- is getting better.  Having a lot less out through abd drain.  Is and has been Afebrile and feeling much better- wants to go home.   ROS:  Pt denies SOB, abd pain, CP, N/V/C/D, and vision changes     Objective:   No results found. Recent Labs    10/22/20 0519 10/24/20 0458  WBC 10.1 13.0*  HGB 10.3* 11.5*  HCT 32.6* 36.7  PLT 519* 603*   Recent Labs    10/22/20 0519 10/24/20 0458  NA 136 136  K 4.1 4.8  CL 106 103  CO2 22 22  GLUCOSE 119* 103*  BUN 9 8  CREATININE 0.76 0.72  CALCIUM 8.2* 8.9    Intake/Output Summary (Last 24 hours) at 10/24/2020 0952 Last data filed at 10/24/2020 0500 Gross per 24 hour  Intake 485 ml  Output 175 ml  Net 310 ml        Physical Exam: Vital Signs Blood pressure 134/72, pulse 94, temperature 98.8 F (37.1 C), resp. rate 18, height 5\' 6"  (1.676 m), weight 71.4 kg, SpO2 99 %.   General: awake, alert, appropriate, laying in bed; on her side towards door; sleepy; NAD HENT: conjugate gaze; oropharynx moist CV: regular rate; no JVD Pulmonary: CTA B/L; no W/R/R- good air movement GI: soft, NT, ND, (+)BS' abd drain- las <10cc in bag-- dark green- per pt, having less drainage.  Psychiatric: appropriate; interactive, but a little sleepy Neurological: Ox3  Musculoskeletal:     Cervical back: Normal range of motion. No rigidity.     Comments: Left thigh incisions C/D/I with skin glue. Incisions looks great on L distal thigh Strength 5-/5 in UE B/L as well as RLE 5-/5; LLE- 4/5 HF/KE/KF and 5-/5 DF and PF  Skin:    General: Skin is warm and dry.     Comments: L thigh incision as above  Neurological:     Mental Status: She is alert and oriented to person, place, and time.    Assessment/Plan: 1. Functional deficits which require 3+ hours per day of  interdisciplinary therapy in a comprehensive inpatient rehab setting. Physiatrist is providing close team supervision and 24 hour management of active medical problems listed below. Physiatrist and rehab team continue to assess barriers to discharge/monitor patient progress toward functional and medical goals  Care Tool:  Bathing    Body parts bathed by patient: Right arm, Left arm, Chest, Abdomen, Face, Front perineal area, Right upper leg, Left upper leg, Right lower leg   Body parts bathed by helper: Buttocks, Left lower leg     Bathing assist Assist Level: Minimal Assistance - Patient > 75%     Upper Body Dressing/Undressing Upper body dressing   What is the patient wearing?: Hospital gown only    Upper body assist Assist Level: Minimal Assistance - Patient > 75%    Lower Body Dressing/Undressing Lower body dressing      What is the patient wearing?: Underwear/pull up     Lower body assist Assist for lower body dressing: Maximal Assistance - Patient 25 - 49%  Toileting Toileting    Toileting assist Assist for toileting: Moderate Assistance - Patient 50 - 74%     Transfers Chair/bed transfer  Transfers assist     Chair/bed transfer assist level: Contact Guard/Touching assist     Locomotion Ambulation   Ambulation assist      Assist level: Contact Guard/Touching assist Assistive device: Walker-rolling Max distance: 20 ft   Walk 10 feet activity   Assist  Walk 10 feet activity did not occur: Safety/medical concerns    Assistive device: Walker-rolling   Walk 50 feet activity   Assist Walk 50 feet with 2 turns activity did not occur: Safety/medical concerns         Walk 150 feet activity   Assist Walk 150 feet activity did not occur: Safety/medical concerns         Walk 10 feet on uneven surface  activity   Assist Walk 10 feet on uneven surfaces activity did not occur: Safety/medical concerns          Wheelchair     Assist Is the patient using a wheelchair?: Yes Type of Wheelchair: Manual    Wheelchair assist level: Supervision/Verbal cueing Max wheelchair distance: 100 ft    Wheelchair 50 feet with 2 turns activity    Assist            Wheelchair 150 feet activity     Assist          Blood pressure 134/72, pulse 94, temperature 98.8 F (37.1 C), resp. rate 18, height  (1.676 m), weight 71.4 kg, SpO2 99 %.  Medical Poblem List and Plan: 1. L distal femur fx s/p ORIF secondary to multiple falls- with complicated medical issues- PWB on LLE             -patient may not shower until abd drain removed?             -ELOS/Goals: 10-14 days- supervision  Con't PT and OT- CIR 2.  Impaired mobility: -DVT/anticoagulation:  Pharmaceutical: Continue Lovenox             -antiplatelet therapy: ASA 3. Pain: continue  Hydrocodone prn.   9/5- pain tolerable- con't regimen 4. Mood: LCSW to follow for evaluation and support.              -antipsychotic agents: N/A 5. Neuropsych: This patient is capable of making decisions on her own behalf. 6. Skin/Wound Care: Monitor incision for healing.  7. Fluids/Electrolytes/Nutrition: Monitor I/O.              --Hypophosphatemia/hypomagnesemia improving post supplement.             --add juven for low protein stores.  8. Left distal femur Fx s/p ORIF: PWB LLE--continue to reinforce precautions 9. 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 Episode of SVT: Treated with IV BB--->po Toprol  9/5- BP and HR controlled- con't regimen 11. Sepsis due to cholecystitis: Perc drain in place with Keflex/Flagyl thorough 09/02             --Leucocytosis continues to fluctuate  9/1- WBC 12k- will recheck in AM- is afebrile and feeling BETTER- so will wait til CBC for AM done.   9/2- WBC is 12.3k- Tm 99.9- no abd pain; U/A (-) 8/28-  but on Keflex til today for skin- Hip incision looks OK- Dopplers (-)- no resp  Sx's; don't think C diff since on Miralax BID and Colace BID and just had ileus- will recheck labs in AM and see if reducing bowel meds helps bowel issues?Likely cholecystitis- but looks nontoxic- might need Ct of abd/pelvis Saturday if WBC goes up.   9/3: WBC down to 10  9/5- WBC back up to 13k- afebrile- no abd pain currently- will recheck in AM CBC-diff and if still elevated, will get another CT scan 12. Acute renal failure: SCr 2.21 at admission and has resolved-->0.75 9/1- Cr 0.81- stable- con't to monitor  13. Acute blood loss anemia: Continue to monitor H/H.   9/5- Hb up to 11.5- doing better- con't to monitor 14. 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   15. Chronic insomnia: Continue Trazodone at bedtime.   9/2- sleeping well- con't regimen 16. OSA: Resume CPAP. 17. Abdominal pain: Has had diarrhea X 3 today--question due to diet v/s issues with drain              --Monitor for now. Advised on avoiding fatty foods. 9/1- see # 18 9/2- Abd pain resolved, however WBC 12.3- might need CT of abd and pelvis if WBC spikes/goes up more tomorrow. 9/4: WBC down to 10- denies any more abdominal pain 9/5- abd pain resolved- just raw on anal area- will order more Anusol.  18. Ileus 9/1- got cleaned out yesterday per staff- on liquid diet-  4 stools since yesterday AM- 1 formed; 3 liquid- KUB pending for this AM. Won't change liquid diet until know ileus has improved.   9/2-resolved- now with loose stools.   9/3: feels the reglan is contributing to loose stools and requests stopping it. Lamotil PRN ordered for loose stools      LOS: 6 days A FACE TO FACE EVALUATION WAS PERFORMED  Heather [name] 10/24/2020, 9:52 AM

## 2020-10-24 NOTE — Progress Notes (Signed)
Physical Therapy Session Note  Patient Details  Name: Heather [name] MRN: [id number] Date of Birth: 1945/07/02  Today's Date: 10/24/2020 PT Individual Time: 0930-1000, 1300-1409 PT Individual Time Calculation (min): 30 min, 69 min  Short Term Goals: Week 1:  PT Short Term Goal 1 (Week 1): Pt will perform least restrictive transfer with min A consistently PT Short Term Goal 2 (Week 1): Pt will ambulate x 50 ft with LRAD and min A PT Short Term Goal 3 (Week 1): Pt will initiate stair training  Skilled Therapeutic Interventions/Progress Updates:    Session 1: pt received in bed and agreeable to therapy. Pt reported some pain but manageable, therapy to tolerance.    Bed mobility with supervision, pt then donned pullover shirt, depends, shorts, and socks with close supervision. Pt requested to use BSC. ambulatory transfer to [facility] with RW and cga. Clothing management and hygiene with supervision. Hand hygiene at sink with supervision.    Gait x 30 ft with RW and CGA. Pt then reported dizziness which resolved upon sitting. Pt propelled w/c with BUE x 70 ft. Pt was instructed to set up transfer to mat table and set up w/c facing mat table. On asking how she intended to transfer pt stood and turned around to sit. Pt was educated on safe transfer technique and performed CGA squat pivot x 4.   Pt returned to bed at end of session and was left with all needs in reach and alarm active.   Session 2: pt received in bed and agreeable to therapy. No immediate complaint of pain. Pt performed squat pivot bed<>w/c<>nustep with supervision and VC for technique at times.   Nustep 3 x 6 min at level 3, 50+ spm, Pt reported RPE=13 at end of first bout. Pt reported some discomfort but was able to continue.  Pt participated in stair training as follows: -3" x 3 backward navigation with RW and CGA -3" x 3 forward navigation with 2 handrails, on incidence of knee buckling, min A to recover -step taps 3 x  20, LLE only.  -8" curb with backward navigation, RW and CGA   Gait training up  to 70 ft with multiple bouts throughout session. Pt reported need to stop d/t UE fatigue.   Pt returned to bed after session with squat pivot transfer and was left with all needs in reach and alarm active.      Therapy Documentation Precautions:  Precautions Precautions: Fall Precaution Comments: cholecystostomy tube; monitor BP (possible ortho hypotension) Restrictions Weight Bearing Restrictions: Yes LLE Weight Bearing: Partial weight bearing LLE Partial Weight Bearing Percentage or Pounds: 50      Therapy/Group: Individual Therapy  Juluis Rainier 10/24/2020, 12:50 PM

## 2020-10-24 NOTE — Progress Notes (Signed)
Physical Therapy Session Note  Patient Details  Name: Heather [name] MRN: [id number] Date of Birth: 09/06/1945  Today's Date: 10/24/2020 PT Individual Time: 1030-1115 PT Individual Time Calculation (min): 45 min   Short Term Goals: Week 1:  PT Short Term Goal 1 (Week 1): Pt will perform least restrictive transfer with min A consistently PT Short Term Goal 2 (Week 1): Pt will ambulate x 50 ft with LRAD and min A PT Short Term Goal 3 (Week 1): Pt will initiate stair training  Skilled Therapeutic Interventions/Progress Updates:     Pt sidelying in bed at start of session - agreeable to therapy but reports "10/10" L knee pain that she recently got pain medications for. Did not appear in any distress during session. She completed sidelying to sitting with distant supervision and then completed stand<>pivot transfer with CGA and no AD to w/c. Pt reporting need to use toilet. Wheeled to bathroom and assisted to [facility] over toilet with minA and use of grab bars. Pt continent of bladder and incontinent of bowel - charted in flowcharts. Able to complete pericare without assist. Provided new clean brief and pt able to return to w/c with CGA with use of grab bars. Pt then wheeled herself to main rehab gym with supervision using BUE's, ~124ft. Focused remainder of session on functional stair training - she negotiated up/down 7 + 5 smaller 3inch steps with 2 hand rails and minA. Needed cues for stepping pattern while ascending with R foot and descending with L foot leading. Pt reports she has no hand rails to enter her house, plans on having hand rail installed prior to discharging. Pt then completed seated there-ex, LAQ with 3lb ankle weight (2x10) and knee extension isometrics 3 sec holds with 3# ankle weight. Propelled herself back  to her room in w/c, ~132ft, and then requesting to return to bed. Assisted to bed with CGA and use of bed rail from w/c to EOB. Bed mobility completed mod I level. Remained  supine with all needs in reach, bed alarm on.  Therapy Documentation Precautions:  Precautions Precautions: Fall Precaution Comments: cholecystostomy tube; monitor BP (possible ortho hypotension) Restrictions Weight Bearing Restrictions: Yes LLE Weight Bearing: Partial weight bearing LLE Partial Weight Bearing Percentage or Pounds: 50 General:    Therapy/Group: Individual Therapy  Orrin Brigham 10/24/2020, 10:43 AM

## 2020-10-24 NOTE — Progress Notes (Signed)
Pt has home unit cpap at bedside but states she does not wish to wear it because it dries her out and the pressure is too high.  RT explained to pt to call RN if she changes her mind and wants to wear device.  RT will continue to monitor.

## 2020-10-24 NOTE — Progress Notes (Signed)
Occupational Therapy Session Note  Patient Details  Name: Heather [name] MRN: [id number] Date of Birth: January 07, 1946  Today's Date: 10/24/2020 OT Individual Time: 0315-9458 OT Individual Time Calculation (min): 58 min    Short Term Goals: Week 1:  OT Short Term Goal 1 (Week 1): Pt will complete ambulatory toilet transfers with min assist OT Short Term Goal 2 (Week 1): Pt will complete LB dressing with min assist with AE as needed OT Short Term Goal 3 (Week 1): Pt will complete 2 grooming tasks in standing to increase activity tolerance/endurance   Skilled Therapeutic Interventions/Progress Updates:    Pt greeted at time of session supine in bed resting but agreeable to OT session, c/o pain in LLE but did not impact treatment. No # given. Discussion with pt regarding diagnosis, heart healthy diet, making better food choices, decreasing high fat foods, etc and pt stating "ill have to change the way I eat after this." Supine > sit Supervision and stand pivot bed > wheelchair > toilet with BSC over top with CGA and RW. Pt with good adherence to WB precautions. Simulated LB dressing with reacher and resistance band as "pants" with CGA. Discussion with pt regarding Supervision goals and plan to DC home with daughter, DME needs, and f/u therapy. RN present during session as well for med pass for indigestion and pain meds. Stand pivot back to bed, alarm on call bell in reach. Provided ice packs for LLE as well.   Therapy Documentation Precautions:  Precautions Precautions: Fall Precaution Comments: cholecystostomy tube; monitor BP (possible ortho hypotension) Restrictions Weight Bearing Restrictions: Yes LLE Weight Bearing: Partial weight bearing LLE Partial Weight Bearing Percentage or Pounds: 50    Therapy/Group: Individual Therapy  Erasmo Score 10/24/2020, 12:54 PM

## 2020-10-25 LAB — CBC WITH DIFFERENTIAL/PLATELET
Abs Immature Granulocytes: 0.07 10*3/uL (ref 0.00–0.07)
Basophils Absolute: 0 10*3/uL (ref 0.0–0.1)
Basophils Relative: 0 %
Eosinophils Absolute: 0.2 10*3/uL (ref 0.0–0.5)
Eosinophils Relative: 2 %
HCT: 36.3 % (ref 36.0–46.0)
Hemoglobin: 11.6 g/dL — ABNORMAL LOW (ref 12.0–15.0)
Immature Granulocytes: 1 %
Lymphocytes Relative: 27 %
Lymphs Abs: 3.6 10*3/uL (ref 0.7–4.0)
MCH: 27 pg (ref 26.0–34.0)
MCHC: 32 g/dL (ref 30.0–36.0)
MCV: 84.6 fL (ref 80.0–100.0)
Monocytes Absolute: 1.1 10*3/uL — ABNORMAL HIGH (ref 0.1–1.0)
Monocytes Relative: 8 %
Neutro Abs: 8.1 10*3/uL — ABNORMAL HIGH (ref 1.7–7.7)
Neutrophils Relative %: 62 %
Platelets: 583 10*3/uL — ABNORMAL HIGH (ref 150–400)
RBC: 4.29 MIL/uL (ref 3.87–5.11)
RDW: 19.1 % — ABNORMAL HIGH (ref 11.5–15.5)
WBC: 13.1 10*3/uL — ABNORMAL HIGH (ref 4.0–10.5)
nRBC: 0 % (ref 0.0–0.2)

## 2020-10-25 NOTE — Progress Notes (Signed)
Occupational Therapy Session Note  Patient Details  Name: Heather [name] MRN: [id number] Date of Birth: 1945/12/03  Today's Date: 10/25/2020 OT Individual Time: 0626-9485 OT Individual Time Calculation (min): 53 min    Short Term Goals: Week 1:  OT Short Term Goal 1 (Week 1): Pt will complete ambulatory toilet transfers with min assist OT Short Term Goal 2 (Week 1): Pt will complete LB dressing with min assist with AE as needed OT Short Term Goal 3 (Week 1): Pt will complete 2 grooming tasks in standing to increase activity tolerance/endurance   Skilled Therapeutic Interventions/Progress Updates:    Pt greeted at time of session finishing up with dietary getting orders, when completed performed bed mobility Mod I with rails to sit EOB before walking short distance to sink with CGA and RW. Seated in wheelchair for UB bathing, set up and LB bathing seated and sit <> stand level with CGA able to reach BLEs past knees as well with figure four, pt stating pain not limiting. Discussion throughout session regarding recent loose bowels/diarrhea and how to accommodate at home if still ongoing with bed pads and depends. Pt self propel throughout room bed <> sink and stand pivot no AD CGA back to bed for dressing tasks. UB dressing set up and LB dressing CGA able to thread seated and sit <> stand to don over hips. Self propel room <> ADL apartment with supervision and performed stand pivot with RW to/from wheelchair <> sofa CGA and tips for where to sit on sofa/furniture. Able to maneuver kitchen as well and sit <> stand to retrieve items from cabinet and wheelchair level to retrieve from fridge with Supervision for new environment. Stand pivot to bed CGA no AD, set up alarm on call bell in reach.   Therapy Documentation Precautions:  Precautions Precautions: Fall Precaution Comments: cholecystostomy tube; monitor BP (possible ortho hypotension) Restrictions Weight Bearing Restrictions: Yes LLE  Weight Bearing: Non weight bearing LLE Partial Weight Bearing Percentage or Pounds: 50     Therapy/Group: Individual Therapy  Erasmo Score 10/25/2020, 7:13 AM

## 2020-10-25 NOTE — Progress Notes (Signed)
Physical Therapy Session Note  Patient Details  Name: Heather [name] MRN: [id number] Date of Birth: [birth year]  Today's Date: 10/25/2020 PT Individual Time: 3491-7915 PT Individual Time Calculation (min): 54 min   Short Term Goals: Week 1:  PT Short Term Goal 1 (Week 1): Pt will perform least restrictive transfer with min A consistently PT Short Term Goal 2 (Week 1): Pt will ambulate x 50 ft with LRAD and min A PT Short Term Goal 3 (Week 1): Pt will initiate stair training  Skilled Therapeutic Interventions/Progress Updates:  Pt received supine in bed, denied pain but stated her stomach was "rumbly". Pt performed bed mobility and sit <>stand from low EOB to RW w/supervision. Pt ambulated 5' w/RW to [facility] w/supervision, noted pt was bearing full weight on LLE so provided verbal correctional cues, pt laughed in response. Pt self-propelled from room to ortho gym (~75') w/supervision. Sit <>stand from [facility] to RW and pt performed car transfer w/supervision. Multimodal cues for adherence to WB precautions, pt able to teach back precautions. Sit <>stand from car to RW w/supervision, pt improved weightbearing precaution adherence while ambulating back to WC (~10'). While sitting in WC, pt's drain line became tangled and pt accidentally pulled on it, resulting in 10/10 pain, decreased to 0/10 after few minutes. Inspected the area and pt reported indigestion and requested to received meds, nursing notified of drain line and med request. Sit <>stand pivot from WC to Nustep without AD w/supervision and pt performed on NuStep level 5 for 3 rounds of on/60min off for cardiovascular endurance, pacing and improved ROM of LLE. Pt reported RPE of 13 on all 3 rounds. Sit <>stand pivot from NuStep to [facility] and pt self-propelled 75' w/supervision. Sit <>stand from [facility] to RW and pt ambulated 35' w/supervision, noted improved compliance w/weight bearing restrictions, step-to gait pattern and minimal kyphosis due to over  reliance on Ues. Pt self-propelled 75' to room and performed sit <>stand pivot from WC to EOB w/supervision. Pt performed bed mobility w/supervision and was left supine in bed, all needs in reach.   Therapy Documentation Precautions:  Precautions Precautions: Fall Precaution Comments: cholecystostomy tube; monitor BP (possible ortho hypotension) Restrictions Weight Bearing Restrictions: Yes LLE Weight Bearing: Non weight bearing LLE Partial Weight Bearing Percentage or Pounds: 50   Therapy/Group: Individual Therapy Jill Alexanders Denissa Cozart, PT, DPT  10/25/2020, 10:22 AM

## 2020-10-25 NOTE — Patient Care Conference (Signed)
Inpatient RehabilitationTeam Conference and Plan of Care Update Date: 10/25/2020   Time: 11:30 AM    Patient Name: Heather [name]      Medical Record Number: [id number]  Date of Birth: April 13, 1945 Sex: Female         Room/Bed: 4M03C/4M03C-01 Payor Info: Payor: [employer] MEDICARE / Plan: [employer] MEDICARE HMO / Product Type: *No Product type* /    Admit Date/Time:  10/18/2020  6:54 PM  Primary Diagnosis:  Closed bicondylar fracture of distal femur, left, with routine healing, subsequent encounter  Hospital Problems: Principal Problem:   Closed bicondylar fracture of distal femur, left, with routine healing, subsequent encounter Active Problems:   Closed fracture of distal end of femur, unspecified fracture morphology, sequela    Expected Discharge Date: Expected Discharge Date: 10/28/20  Team Members Present: Physician leading conference: Dr. Genice Rouge Social Worker Present: Lavera Guise, BSW Nurse Present: Kennyth Arnold, RN PT Present: Bernie Covey, PT OT Present: Earleen Newport, OT PPS Coordinator present : Fae Pippin, SLP     Current Status/Progress Goal Weekly Team Focus  Bowel/Bladder   Continent of B/B. LBM  9/6  Remain continent  Assess Qshift and PRN   Swallow/Nutrition/ Hydration             ADL's   Supervision/CGA LB bathe and dress, CGA stand pivots with RW and no AD, functional mob short distance CGA, pain in LLE limits at times, loose bowels/diarrhea  Supervision  sit <> stands, standing balance, ADL transfers, ADL retraining, global endurance, AE PRN, DC planning   Mobility   CGA, supervision for gait and transfers,  supervision gait and transfers, min A stairs  d/c planning, stairs   Communication             Safety/Cognition/ Behavioral Observations            Pain   c/o pain to left leg/knee. PRN available  Pain <3  Assess Qshift and PRN   Skin   Multiple closed incisions. Biliary drain to abdomen.  Promote healing and prevention  of skin breakdown.  Assess Qshift and PRN     Discharge Planning:  Patient discharging home with daughter and granddaughter in [place] to provide care, 24/7.   Team Discussion: Stomach feels better, peri area is raw and she doesn't like the washcloths. 13.1 WBC, UA/C&S needed ASAP. Continent B/B, reports left knee pain. Incision to left knee looks good. Biliary tube site looks good. Discharging home with daughter and granddaughter. Supervision/contact guard lower body bathing and dressing. Contact guard/supervision transfers, bed mobility. Really pushes through. Had loose stools over the weekend. PT reports she is almost at goal level. Contact guard transfers, working on stairs.  Patient on target to meet rehab goals: yes  *See Care Plan and progress notes for long and short-term goals.   Revisions to Treatment Plan:  MD checking UA/C&S.  Teaching Needs: Family education, medication management, pain management, skin/wound care, transfer training, gait training, balance training, endurance training, safety awareness.  Current Barriers to Discharge: Decreased caregiver support, Medical stability, Home enviroment access/layout, Wound care, Lack of/limited family support, Weight bearing restrictions, Medication compliance, and Pending surgery  Possible Resolutions to Barriers: Continue current medications, provide emotional support.     Medical Summary Current Status: continent B/B- abd pain better; L knee pain; no sleep issues; incision L distal thigh and biliary drain; WBC of 13.1-  Barriers to Discharge: Decreased family/caregiver support;Home enviroment access/layout;Medical stability;Weight bearing restrictions;Pending Surgery;Wound care  Barriers to Discharge Comments: home  with daughter and granddaughter; UTI vs cholecystitis- WBC 13.1k- Possible Resolutions to [employer]: focus- drain on abd; for cholecystitis and WBC of 13.1- getitng worked up for leukocytosis; ; told OT still  loose stools over weekend; supervision gait/transfers; d/c Friday- 9/9   Continued Need for Acute Rehabilitation Level of Care: The patient requires daily medical management by a physician with specialized training in physical medicine and rehabilitation for the following reasons: Direction of a multidisciplinary physical rehabilitation program to maximize functional independence : Yes Medical management of patient stability for increased activity during participation in an intensive rehabilitation regime.: Yes Analysis of laboratory values and/or radiology reports with any subsequent need for medication adjustment and/or medical intervention. : Yes   I attest that I was present, lead the team conference, and concur with the assessment and plan of the team.   Tennis Must 10/25/2020, 3:36 PM

## 2020-10-25 NOTE — Progress Notes (Signed)
Physical Therapy Session Note  Patient Details  Name: Heather [name] MRN: [id number] Date of Birth: 17-Jan-1946  Today's Date: 10/25/2020 PT Individual Time: 1400-1445 PT Individual Time Calculation (min): 45 min   Short Term Goals: Week 1:  PT Short Term Goal 1 (Week 1): Pt will perform least restrictive transfer with min A consistently PT Short Term Goal 2 (Week 1): Pt will ambulate x 50 ft with LRAD and min A PT Short Term Goal 3 (Week 1): Pt will initiate stair training  Skilled Therapeutic Interventions/Progress Updates:    pt received in bed and agreeable to therapy. Pt reported some pain in her LE but that pain at her abdominal drain was more concerning. Supine>sit with supervision, pt immediately reported intense pain at drain. Line was pulled during previous session, nursing already aware per previous therapist note and pt report. Pt's nurse not available at this time. Pt returned to bed and participated in bed level exercise.  Pt performed the following exercises to promote LE strength and endurance:  -heel slides 3 x 20 BIL -supine abduction 3 x 20 -SLR 3x 20, alternating -BKFO 3 x 20 alternating  -bridges 2 x 10 with Bias toward RLE to prevent excessive WB on L side  Pt remained in bed after session and was left with all needs in reach and alarm active.    Therapy Documentation Precautions:  Precautions Precautions: Fall Precaution Comments: cholecystostomy tube; monitor BP (possible ortho hypotension) Restrictions Weight Bearing Restrictions: Yes LLE Weight Bearing: Non weight bearing LLE Partial Weight Bearing Percentage or Pounds: 50    Therapy/Group: Individual Therapy  Juluis Rainier 10/25/2020, 2:34 PM

## 2020-10-25 NOTE — Progress Notes (Signed)
Patient ID: Heather [name], female   DOB: [birth year], 75 y.o.   MRN: [id number] Team Conference Report to Patient/Family  Team Conference discussion was reviewed with the patient and caregiver, including goals, any changes in plan of care and target discharge date.  Patient and caregiver express understanding and are in agreement.  The patient has a target discharge date of 10/28/20.  SW met with pt at bedside. Pt called daughter. SW provided pt with conference updates. Pt doing well physically. Physician addressing medical concerns, pt anticipated to d.c on 9/9  Dyanne Iha 10/25/2020, 12:46 PM

## 2020-10-25 NOTE — Progress Notes (Signed)
PROGRESS NOTE   Subjective/Complaints:  Pt reports no SOB, no pulmonary issues; no Sx's of UTI- however is "raw down there" all over privates.  Abd pain is "good".   Doesn't feel "bad at all".   ROS:  Pt denies SOB, abd pain, CP, N/V/C/D, and vision changes   Objective:   No results found. Recent Labs    10/24/20 0458 10/25/20 0505  WBC 13.0* 13.1*  HGB 11.5* 11.6*  HCT 36.7 36.3  PLT 603* 583*   Recent Labs    10/24/20 0458  NA 136  K 4.8  CL 103  CO2 22  GLUCOSE 103*  BUN 8  CREATININE 0.72  CALCIUM 8.9    Intake/Output Summary (Last 24 hours) at 10/25/2020 1030 Last data filed at 10/24/2020 1800 Gross per 24 hour  Intake 241 ml  Output 150 ml  Net 91 ml        Physical Exam: Vital Signs Blood pressure (!) 115/58, pulse 82, temperature 98.8 F (37.1 C), temperature source Oral, resp. rate 18, height 5\' 6"  (1.676 m), weight 70 kg, SpO2 96 %.    General: awake, alert, appropriate, laying on side in bed; NAD HENT: conjugate gaze; oropharynx moist CV: regular rate; no JVD Pulmonary: CTA B/L; no W/R/R- good air movement- sounds great GI: soft, NT, ND, (+)BS- incision for drain looks good- no erythema/no drainage; drain has 15-20 cc in bag;  Psychiatric: appropriate Neurological: alert   Musculoskeletal:     Cervical back: Normal range of motion. No rigidity.     Comments: Left thigh incisions C/D/I with skin glue. Incisions looks great on L distal thigh Strength 5-/5 in UE B/L as well as RLE 5-/5; LLE- 4/5 HF/KE/KF and 5-/5 DF and PF  Skin:    General: Skin is warm and dry.     Comments: L thigh incision distally healing great- no drainage, no erythema; just a few scabs Neurological:     Mental Status: She is alert and oriented to person, place, and time.    Assessment/Plan: 1. Functional deficits which require 3+ hours per day of interdisciplinary therapy in a comprehensive inpatient  rehab setting. Physiatrist is providing close team supervision and 24 hour management of active medical problems listed below. Physiatrist and rehab team continue to assess barriers to discharge/monitor patient progress toward functional and medical goals  Care Tool:  Bathing    Body parts bathed by patient: Right arm, Left arm, Chest, Abdomen, Face, Front perineal area, Right upper leg, Left upper leg, Right lower leg, Left lower leg, Buttocks   Body parts bathed by helper: Buttocks, Left lower leg     Bathing assist Assist Level: Contact Guard/Touching assist     Upper Body Dressing/Undressing Upper body dressing   What is the patient wearing?: Pull over shirt    Upper body assist Assist Level: Set up assist    Lower Body Dressing/Undressing Lower body dressing      What is the patient wearing?: Pants     Lower body assist Assist for lower body dressing: Contact Guard/Touching assist     Toileting Toileting    Toileting assist Assist for toileting: Moderate Assistance - Patient 50 -  74%     Transfers Chair/bed transfer  Transfers assist     Chair/bed transfer assist level: Contact Guard/Touching assist     Locomotion Ambulation   Ambulation assist      Assist level: Contact Guard/Touching assist Assistive device: Walker-rolling Max distance: 20 ft   Walk 10 feet activity   Assist  Walk 10 feet activity did not occur: Safety/medical concerns    Assistive device: Walker-rolling   Walk 50 feet activity   Assist Walk 50 feet with 2 turns activity did not occur: Safety/medical concerns         Walk 150 feet activity   Assist Walk 150 feet activity did not occur: Safety/medical concerns         Walk 10 feet on uneven surface  activity   Assist Walk 10 feet on uneven surfaces activity did not occur: Safety/medical concerns         Wheelchair     Assist Is the patient using a wheelchair?: Yes Type of Wheelchair: Manual     Wheelchair assist level: Supervision/Verbal cueing Max wheelchair distance: 100 ft    Wheelchair 50 feet with 2 turns activity    Assist            Wheelchair 150 feet activity     Assist          Blood pressure (!) 115/58, pulse 82, temperature 98.8 F (37.1 C), temperature source Oral, resp. rate 18, height 5\' 6"  (1.676 m), weight 70 kg, SpO2 96 %.  Medical Poblem List and Plan: 1. L distal femur fx s/p ORIF secondary to multiple falls- with complicated medical issues- PWB on LLE             -patient may not shower until abd drain removed?             -ELOS/Goals: 10-14 days- supervision  Con't PT and OT_ CIR- team conference today to determine actual d/c date.  2.  Impaired mobility: -DVT/anticoagulation:  Pharmaceutical: Continue Lovenox             -antiplatelet therapy: ASA 3. Pain: continue  Hydrocodone prn.   9/6- pain "is fine"- con't regimen 4. Mood: LCSW to follow for evaluation and support.              -antipsychotic agents: N/A 5. Neuropsych: This patient is capable of making decisions on her own behalf. 6. Skin/Wound Care: Monitor incision for healing.  7. Fluids/Electrolytes/Nutrition: Monitor I/O.              --Hypophosphatemia/hypomagnesemia improving post supplement.             --add juven for low protein stores.  8. Left distal femur Fx s/p ORIF: PWB LLE--continue to reinforce precautions 9. 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 Episode of SVT: Treated with IV BB--->po Toprol  9/5- BP and HR controlled- con't regimen 11. Sepsis due to cholecystitis: Perc drain in place with Keflex/Flagyl thorough 09/02             --Leucocytosis continues to fluctuate  9/1- WBC 12k- will recheck in AM- is afebrile and feeling BETTER- so will wait til CBC for AM done.   9/2- WBC is 12.3k- Tm 99.9- no abd pain; U/A (-) 8/28- but on Keflex til  today for skin- Hip incision looks OK- Dopplers (-)- no resp Sx's; don't  think C diff since on Miralax BID and Colace BID and just had ileus- will recheck labs in AM and see if reducing bowel meds helps bowel issues?Likely cholecystitis- but looks nontoxic- might need Ct of abd/pelvis Saturday if WBC goes up.   9/3: WBC down to 10  9/5- WBC back up to 13k- afebrile- no abd pain currently- will recheck in AM CBC-diff and if still elevated, will get another CT scan  9/6- wbc IS STILL 13.1K- WILL CHECK U/A and Cx- and if negative, will then get CT of abd/pelvis.  12. Acute renal failure: SCr 2.21 at admission and has resolved-->0.75 9/1- Cr 0.81- stable- con't to monitor  13. Acute blood loss anemia: Continue to monitor H/H.   9/5- Hb up to 11.5- doing better- con't to monitor 14. 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   15. Chronic insomnia: Continue Trazodone at bedtime.   9/2- sleeping well- con't regimen 16. OSA: Resume CPAP. 17. Abdominal pain: Has had diarrhea X 3 today--question due to diet v/s issues with drain              --Monitor for now. Advised on avoiding fatty foods. 9/1- see # 18 9/2- Abd pain resolved, however WBC 12.3- might need CT of abd and pelvis if WBC spikes/goes up more tomorrow. 9/4: WBC down to 10- denies any more abdominal pain 9/5- abd pain resolved- just raw on anal area- will order more Anusol.   9/6- will check U/A and Cx- and if negative, get another CT scan.  18. Ileus 9/1- got cleaned out yesterday per staff- on liquid diet-  4 stools since yesterday AM- 1 formed; 3 liquid- KUB pending for this AM. Won't change liquid diet until know ileus has improved.   9/2-resolved- now with loose stools.   9/3: feels the reglan is contributing to loose stools and requests stopping it. Lamotil PRN ordered for loose stools      LOS: 7 days A FACE TO FACE EVALUATION WAS PERFORMED  Meia Emley 10/25/2020, 10:30 AM

## 2020-10-25 NOTE — Progress Notes (Signed)
Patient ID: Heather [name], female   DOB: 11/24/1945, 75 y.o.   MRN: [id number]  SW received VM from pt on 9/3. Pt reports diarrhea. SW left VM with patient and pt dtr to follow up.  [place], [place] [phone]

## 2020-10-25 NOTE — Progress Notes (Signed)
Pt refuses cpap at this time 

## 2020-10-26 ENCOUNTER — Inpatient Hospital Stay (HOSPITAL_COMMUNITY): Payer: Medicare HMO

## 2020-10-26 LAB — URINALYSIS, ROUTINE W REFLEX MICROSCOPIC
Bilirubin Urine: NEGATIVE
Glucose, UA: NEGATIVE mg/dL
Hgb urine dipstick: NEGATIVE
Ketones, ur: NEGATIVE mg/dL
Nitrite: NEGATIVE
Protein, ur: NEGATIVE mg/dL
Specific Gravity, Urine: 1.015 (ref 1.005–1.030)
pH: 5 (ref 5.0–8.0)

## 2020-10-26 MED ORDER — IOHEXOL 350 MG/ML SOLN
70.0000 mL | Freq: Once | INTRAVENOUS | Status: AC | PRN
  Administered 2020-10-26: 70 mL via INTRAVENOUS

## 2020-10-26 MED ORDER — IOHEXOL 9 MG/ML PO SOLN
ORAL | Status: AC
  Filled 2020-10-26: qty 1000

## 2020-10-26 NOTE — Progress Notes (Signed)
PROGRESS NOTE   Subjective/Complaints:  Pt reports feeling great except perc drain- was told by staff has abd pain, however per d/w pt about this topic, she reports only the drain painful.   U/A (-) for UTI.  WBC at 13k.   ROS:  Pt denies SOB, abd pain, CP, N/V/C/D, and vision changes   Objective:   No results found. Recent Labs    10/24/20 0458 10/25/20 0505  WBC 13.0* 13.1*  HGB 11.5* 11.6*  HCT 36.7 36.3  PLT 603* 583*   Recent Labs    10/24/20 0458  NA 136  K 4.8  CL 103  CO2 22  GLUCOSE 103*  BUN 8  CREATININE 0.72  CALCIUM 8.9    Intake/Output Summary (Last 24 hours) at 10/26/2020 0814 Last data filed at 10/25/2020 1945 Gross per 24 hour  Intake 472 ml  Output 400 ml  Net 72 ml        Physical Exam: Vital Signs Blood pressure (!) 112/56, pulse 93, temperature 99.6 F (37.6 C), temperature source Oral, resp. rate 18, height 5\' 6"  (1.676 m), weight 70.2 kg, SpO2 98 %.     General: awake, alert, appropriate, laying supine in bed; NAD HENT: conjugate gaze; oropharynx moist CV: regular rate; no JVD Pulmonary: CTA B/L; no W/R/R- good air movement GI: soft, NT, ND, (+)BS- NOT TTP except around perc drain Psychiatric: appropriate, smiled for first time Neurological: Ox3  Musculoskeletal:     Cervical back: Normal range of motion. No rigidity.     Comments: Left thigh incisions C/D/I with skin glue. Incisions looks great on L distal thigh Strength 5-/5 in UE B/L as well as RLE 5-/5; LLE- 4/5 HF/KE/KF and 5-/5 DF and PF  Skin:    General: Skin is warm and dry.     Comments: L thigh incision distally healing great- no drainage, no erythema; just a few scabs Neurological:     Mental Status: She is alert and oriented to person, place, and time.    Assessment/Plan: 1. Functional deficits which require 3+ hours per day of interdisciplinary therapy in a comprehensive inpatient rehab  setting. Physiatrist is providing close team supervision and 24 hour management of active medical problems listed below. Physiatrist and rehab team continue to assess barriers to discharge/monitor patient progress toward functional and medical goals  Care Tool:  Bathing    Body parts bathed by patient: Right arm, Left arm, Chest, Abdomen, Face, Front perineal area, Right upper leg, Left upper leg, Right lower leg, Left lower leg, Buttocks   Body parts bathed by helper: Buttocks, Left lower leg     Bathing assist Assist Level: Contact Guard/Touching assist     Upper Body Dressing/Undressing Upper body dressing   What is the patient wearing?: Pull over shirt    Upper body assist Assist Level: Set up assist    Lower Body Dressing/Undressing Lower body dressing      What is the patient wearing?: Pants     Lower body assist Assist for lower body dressing: Contact Guard/Touching assist     Toileting Toileting    Toileting assist Assist for toileting: Moderate Assistance - Patient 50 - 74%  Transfers Chair/bed transfer  Transfers assist     Chair/bed transfer assist level: Supervision/Verbal cueing     Locomotion Ambulation   Ambulation assist      Assist level: Supervision/Verbal cueing Assistive device: Walker-rolling Max distance: 35'   Walk 10 feet activity   Assist  Walk 10 feet activity did not occur: Safety/medical concerns  Assist level: Supervision/Verbal cueing Assistive device: Walker-rolling   Walk 50 feet activity   Assist Walk 50 feet with 2 turns activity did not occur: Safety/medical concerns         Walk 150 feet activity   Assist Walk 150 feet activity did not occur: Safety/medical concerns         Walk 10 feet on uneven surface  activity   Assist Walk 10 feet on uneven surfaces activity did not occur: Safety/medical concerns         Wheelchair     Assist Is the patient using a wheelchair?: Yes Type of  Wheelchair: Manual    Wheelchair assist level: Supervision/Verbal cueing Max wheelchair distance: 100 ft    Wheelchair 50 feet with 2 turns activity    Assist            Wheelchair 150 feet activity     Assist          Blood pressure (!) 112/56, pulse 93, temperature 99.6 F (37.6 C), temperature source Oral, resp. rate 18, height 5\' 6"  (1.676 m), weight 70.2 kg, SpO2 98 %.  Medical Poblem List and Plan: 1. L distal femur fx s/p ORIF secondary to multiple falls- with complicated medical issues- PWB on LLE             -patient may not shower until abd drain removed?             -ELOS/Goals: 10-14 days- supervision  Con't PT and OT- d/c date set 9/9 if medically ready.  2.  Impaired mobility: -DVT/anticoagulation:  Pharmaceutical: Continue Lovenox             -antiplatelet therapy: ASA 3. Pain: continue  Hydrocodone prn.   9/7- only having pain around drain- con't prns 4. Mood: LCSW to follow for evaluation and support.              -antipsychotic agents: N/A 5. Neuropsych: This patient is capable of making decisions on her own behalf. 6. Skin/Wound Care: Monitor incision for healing.  7. Fluids/Electrolytes/Nutrition: Monitor I/O.              --Hypophosphatemia/hypomagnesemia improving post supplement.             --add juven for low protein stores.  8. Left distal femur Fx s/p ORIF: PWB LLE--continue to reinforce precautions 9. 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 Episode of SVT: Treated with IV BB--->po Toprol  9/7- BP and HR controlled- con't regimen 11. Sepsis due to cholecystitis: Perc drain in place with Keflex/Flagyl thorough 09/02             --Leucocytosis continues to fluctuate  9/1- WBC 12k- will recheck in AM- is afebrile and feeling BETTER- so will wait til CBC for AM done.   9/2- WBC is 12.3k- Tm 99.9- no abd pain; U/A (-) 8/28- but  on Keflex til today for skin- Hip incision looks OK- Dopplers (-)- no resp Sx's; don't think C diff since on Miralax  BID and Colace BID and just had ileus- will recheck labs in AM and see if reducing bowel meds helps bowel issues?Likely cholecystitis- but looks nontoxic- might need Ct of abd/pelvis Saturday if WBC goes up.   9/3: WBC down to 10  9/5- WBC back up to 13k- afebrile- no abd pain currently- will recheck in AM CBC-diff and if still elevated, will get another CT scan  9/6- wbc IS STILL 13.1K- WILL CHECK U/A and Cx- and if negative, will then get CT of abd/pelvis.   9/7- U/A negative- will check CT of abd pelvis with contrast- needs to [facility] what's going on- pt mentioned someone came about drain- no note 12. Acute renal failure: SCr 2.21 at admission and has resolved-->0.75 9/1- Cr 0.81- stable- con't to monitor  13. Acute blood loss anemia: Continue to monitor H/H.   9/5- Hb up to 11.5- doing better- con't to monitor 14. 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   15. Chronic insomnia: Continue Trazodone at bedtime.   9/2- sleeping well- con't regimen 16. OSA: Resume CPAP. 17. Abdominal pain: Has had diarrhea X 3 today--question due to diet v/s issues with drain              --Monitor for now. Advised on avoiding fatty foods. 9/1- see # 18 9/2- Abd pain resolved, however WBC 12.3- might need CT of abd and pelvis if WBC spikes/goes up more tomorrow. 9/4: WBC down to 10- denies any more abdominal pain 9/5- abd pain resolved- just raw on anal area- will order more Anusol.   9/6- will check U/A and Cx- and if negative, get another CT scan.  18. Ileus 9/1- got cleaned out yesterday per staff- on liquid diet-  4 stools since yesterday AM- 1 formed; 3 liquid- KUB pending for this AM. Won't change liquid diet until know ileus has improved.   9/2-resolved- now with loose stools.   9/3: feels the reglan is contributing to loose stools and requests stopping it. Lamotil PRN ordered  for loose stools  9/7- diarrhea has resolved it appears.       LOS: 8 days A FACE TO FACE EVALUATION WAS PERFORMED  Heather [name] 10/26/2020, 8:14 AM
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
## 2020-10-26 NOTE — Progress Notes (Signed)
Orthopaedic Trauma Progress Note  SUBJECTIVE: Doing well this morning, about to start working with therapy in the rehab gym. No other specific complaints currently. Daughter present for therapy session.   Patient seen again this afternoon. Resting in bed. No complaints. Feels ready to go home.   OBJECTIVE:  Vitals:   10/25/20 1947 10/26/20 0442  BP: 125/67 (!) 112/56  Pulse: 76 93  Resp: 18 18  Temp: 97.9 F (36.6 C) 99.6 F (37.6 C)  SpO2: 100% 98%    General: Sitting up in wheelchair, no acute distress.  Pleasant and cooperative Respiratory: No increased work of breathing.  Left lower extremity: Incisions clean, dry, intact with dermabond in place. Mild tenderness with palpation over the knee.  Ankle dorsiflexion/plantarflexion is intact.  Endorses sensation throughout extremity with exception of decreased sensation through the midfoot.  Patient notes this is close to her baseline. +DP pulse  IMAGING: Stable post op imaging. Plan for repeat imaging 10/27/20  LABS:  Results for orders placed or performed during the hospital encounter of 10/18/20 (from the past 24 hour(s))  Urinalysis, Routine w reflex microscopic Urine, Clean Catch     Status: Abnormal   Collection Time: 10/26/20  7:00 AM  Result Value Ref Range   Color, Urine YELLOW YELLOW   APPearance HAZY (A) CLEAR   Specific Gravity, Urine 1.015 1.005 - 1.030   pH 5.0 5.0 - 8.0   Glucose, UA NEGATIVE NEGATIVE mg/dL   Hgb urine dipstick NEGATIVE NEGATIVE   Bilirubin Urine NEGATIVE NEGATIVE   Ketones, ur NEGATIVE NEGATIVE mg/dL   Protein, ur NEGATIVE NEGATIVE mg/dL   Nitrite NEGATIVE NEGATIVE   Leukocytes,Ua TRACE (A) NEGATIVE   RBC / HPF 0-5 0 - 5 RBC/hpf   WBC, UA 0-5 0 - 5 WBC/hpf   Bacteria, UA RARE (A) NONE SEEN   Squamous Epithelial / LPF 6-10 0 - 5   Mucus PRESENT    Hyaline Casts, UA PRESENT      ASSESSMENT: Heather [name] is a 75 y.o. female, 13 days s/p ORIF LEFT DISTAL FEMUR FRACTURE  CV/Blood  loss: Hgb 11.6 yesterday morning, stable   PLAN: Weightbearing: PWB 50%  LLE ROM: Okay for unrestricted hip and knee motion as tolerated Incisional and dressing care: Okay to leave incisions open to air Showering: Okay to shower from ortho standpoint. Incisions may get wet Orthopedic device(s): None  Pain management: continue current regimen VTE prophylaxis: Lovenox, SCDs Impediments to Fracture Healing: Vit D level low normal range at 31, no supplementation needed Dispo: Continue therapies as tolerated. Repeat imaging of left femur ordered for 10/27/20. Looks like plan is for discharge home 10/28/20. Ok to transition to oral agent x 14 days for DVT prophylaxis at discharge  Follow - up plan: Will continue to follow while in hospital and plan for outpatient follow-up 2 weeks after d/c  Contact information:  Truitt Merle MD, Ulyses Southward PA-C. After hours and holidays please check [email or web address] for group call information for [facility]   Tashiana Lamarca A. Michaelyn Barter, PA-C [phone] (office) [email or web address]

## 2020-10-26 NOTE — Progress Notes (Signed)
Occupational Therapy Session Note  Patient Details  Name: Heather [name] MRN: [id number] Date of Birth: [birth year]  Today's Date: 10/26/2020 OT Individual Time: 1135-1200 OT Individual Time Calculation (min): 25 min    Short Term Goals: Week 1:  OT Short Term Goal 1 (Week 1): Pt will complete ambulatory toilet transfers with min assist OT Short Term Goal 2 (Week 1): Pt will complete LB dressing with min assist with AE as needed OT Short Term Goal 3 (Week 1): Pt will complete 2 grooming tasks in standing to increase activity tolerance/endurance   Skilled Therapeutic Interventions/Progress Updates:   Pt received supine in bed with daughter present, c/o 7/10 pain in L knee & agreeable to tx. Small lump noted on dorsal side of L forearm - pt reported RN tried to draw blood earlier. RN notified during tx. Pt completed bed mobility supine > EOB with close (S) and sit>stand using RW with CGA. Pt verbalized precautions for LLE and demonstrated awareness of drain in movement. Performed oral hygiene and UB bathing seated in wc at sink level with setup. Pt moved drain appropriately during clothing management. Pt agreeable to complete short functional mobility focusing on activity tolerance in standing using RW, but c/o sudden indigestion & asking for ice cubes. Ice cubes given to pt, and she requested to use the toilet. Pt used RW with CGA to ambulate to [facility], requested privacy and voided urine. Impulsive behavior noted throughout tx - pt stood from [facility] without supervision to complete peri-hygiene and LB clothing management. VC to remind pt of WB precautions. Pt completed ambulatory transfer to bed with close (S). Left supine in bed with alarm on, call bell nearby and needs met.   Therapy Documentation Precautions:  Precautions Precautions: Fall Precaution Comments: cholecystostomy tube; monitor BP (possible ortho hypotension) Restrictions Weight Bearing Restrictions: Yes LLE Weight Bearing: Non  weight bearing LLE Partial Weight Bearing Percentage or Pounds: 50    Therapy/Group: Individual Therapy  Heather [name] 10/26/2020, 7:31 AM

## 2020-10-26 NOTE — Discharge Instructions (Addendum)
Inpatient Rehab Discharge Instructions  Heather [name] Discharge date and time: 10/28/20   Activities/Precautions/ Functional Status: Activity: no lifting, driving, or strenuous exercise for  cleared by MD Diet: low fat, low cholesterol diet Wound Care: keep wound clean and dry. Contact Dr. Jena Gauss if you develop any problems with your leg incision/wound--redness, swelling, increase in pain, drainage or if you develop fever or chills.    Functional status:  ___ No restrictions     ___ Walk up steps independently _X__ 24/7 supervision/assistance   ___ Walk up steps with assistance ___ Intermittent supervision/assistance  ___ Bathe/dress independently ___ Walk with walker     __X_ Bathe/dress with assistance ___ Walk Independently    ___ Shower independently ___ Walk with assistance    ___ Shower with assistance _X__ No alcohol     ___ Return to work/school ________   Special Instructions: Flush drain with 5 cc normal saline daily. Contact Dr. Elby Showers if you have any problems with the gallbladder drain.    COMMUNITY REFERRALS UPON DISCHARGE:    Outpatient: PT     OT                Agency: [facility] Phone: [phone]              Appointment Date/Time:TBD  Medical Equipment/Items Ordered: Wheelchair, Bedside Commode                                                  Agency/Supplier: [employer]  My questions have been answered and I understand these instructions. I will adhere to these goals and the provided educational materials after my discharge from the hospital.  Patient/Caregiver Signature _______________________________ Date __________  Clinician Signature _______________________________________ Date __________  Please bring this form and your medication list with you to all your follow-up doctor's appointments.       [facility] Discharge Instructions   General Discharge Instructions  WEIGHT  BEARING STATUS:50% partial weightbearing left lower extremity  RANGE OF MOTION/ACTIVITY: Ok for knee motion as tolerated  Wound Care: Incisions can be left open to air if there is no drainage. If incision continues to have drainage, follow wound care instructions below. Okay to shower if no drainage from incisions.  DVT/PE prophylaxis:  Eliquis x 14 days  Diet: as you were eating previously.  Can use over the counter stool softeners and bowel preparations, such as Miralax, to help with bowel movements.  Narcotics can be constipating.  Be sure to drink plenty of fluids  PAIN MEDICATION USE AND EXPECTATIONS  You have likely been given narcotic medications to help control your pain.  After a traumatic event that results in an fracture (broken bone) with or without surgery, it is ok to use narcotic pain medications to help control one's pain.  We understand that everyone responds to pain differently and each individual patient will be evaluated on a regular basis for the continued need for narcotic medications. Ideally, narcotic medication use should last no more than 6-8 weeks (coinciding with fracture healing).   As a patient it is your responsibility as well to monitor narcotic medication use and report the amount and frequency you use these medications when you come to your office visit.   We would also advise that  if you are using narcotic medications, you should take a dose prior to therapy to maximize you participation.  IF YOU ARE ON NARCOTIC MEDICATIONS IT IS NOT PERMISSIBLE TO OPERATE A MOTOR VEHICLE (MOTORCYCLE/CAR/TRUCK/MOPED) OR HEAVY MACHINERY DO NOT MIX NARCOTICS WITH OTHER CNS (CENTRAL NERVOUS SYSTEM) DEPRESSANTS SUCH AS ALCOHOL   STOP SMOKING OR USING NICOTINE PRODUCTS!!!!  As discussed nicotine severely impairs your body's ability to heal surgical and traumatic wounds but also impairs bone healing.  Wounds and bone heal by forming microscopic blood vessels (angiogenesis) and  nicotine is a vasoconstrictor (essentially, shrinks blood vessels).  Therefore, if vasoconstriction occurs to these microscopic blood vessels they essentially disappear and are unable to deliver necessary nutrients to the healing tissue.  This is one modifiable factor that you can do to dramatically increase your chances of healing your injury.    (This means no smoking, no nicotine gum, patches, etc)  DO NOT USE NONSTEROIDAL ANTI-INFLAMMATORY DRUGS (NSAID'S)  Using products such as Advil (ibuprofen), Aleve (naproxen), Motrin (ibuprofen) for additional pain control during fracture healing can delay and/or prevent the healing response.  If you would like to take over the counter (OTC) medication, Tylenol (acetaminophen) is ok.  However, some narcotic medications that are given for pain control contain acetaminophen as well. Therefore, you should not exceed more than 4000 mg of tylenol in a day if you do not have liver disease.  Also note that there are may OTC medicines, such as cold medicines and allergy medicines that my contain tylenol as well.  If you have any questions about medications and/or interactions please ask your doctor/PA or your pharmacist.      ICE AND ELEVATE INJURED/OPERATIVE EXTREMITY  Using ice and elevating the injured extremity above your heart can help with swelling and pain control.  Icing in a pulsatile fashion, such as 20 minutes on and 20 minutes off, can be followed.    Do not place ice directly on skin. Make sure there is a barrier between to skin and the ice pack.    Using frozen items such as frozen peas works well as the conform nicely to the are that needs to be iced.  USE AN ACE WRAP OR TED HOSE FOR SWELLING CONTROL  In addition to icing and elevation, Ace wraps or TED hose are used to help limit and resolve swelling.  It is recommended to use Ace wraps or TED hose until you are informed to stop.    When using Ace Wraps start the wrapping distally (farthest away from  the body) and wrap proximally (closer to the body)   Example: If you had surgery on your leg or thing and you do not have a splint on, start the ace wrap at the toes and work your way up to the thigh        If you had surgery on your upper extremity and do not have a splint on, start the ace wrap at your fingers and work your way up to the upper arm   CALL THE OFFICE WITH ANY QUESTIONS OR CONCERNS: [phone]   VISIT OUR WEBSITE FOR ADDITIONAL INFORMATION: [email or web address]     Discharge Wound Care Instructions  Do NOT apply any ointments, solutions or lotions to pin sites or surgical wounds.  These prevent needed drainage and even though solutions like hydrogen peroxide kill bacteria, they also damage cells lining the pin sites that help fight infection.  Applying lotions or ointments can keep the wounds moist and can cause  them to breakdown and open up as well. This can increase the risk for infection. When in doubt call the office.   You are ok to shower. Clean incision gently with soap and water.

## 2020-10-26 NOTE — Progress Notes (Signed)
Patient ID: Heather [name], female   DOB: 1945/10/27, 75 y.o.   MRN: [id number]  Bedside Commode and Wheelchair ordered through [employer].  [place], [place] [phone]

## 2020-10-26 NOTE — Progress Notes (Signed)
Occupational Therapy Discharge Summary  Patient Details  Name: Heather [name] MRN: [id number] Date of Birth: 07-11-1945   Patient has met 7 of 7 long term goals due to improved activity tolerance, improved balance, postural control, ability to compensate for deficits, and improved coordination.  Patient to discharge at overall Supervision level.  Patient's care partner is independent to provide the necessary physical assistance at discharge.  Pt is overall Supervision for sink level bathing including LB bathing using figure four and compensatory techniques, not cleared to shower d/t drain. Supervision for LB dressing as well, knows how to use reacher but can perform without. Supervision for ADL transfers to bed, toilet, chair, etc. Pt to DC home with recommended 24/7 stating family with provide. Note pt has been limited by pain in LLE at times during stay at [facility] but still meeting Supervision goals with RW.  Reasons goals not met: n/a  Recommendation:  No follow up needed, will follow up with OPPT for further mobility/ROM/LLE deficits  Equipment: [facility]  Reasons for discharge: treatment goals met and discharge from hospital  Patient/family agrees with progress made and goals achieved: Yes  OT Discharge Precautions/Restrictions  Precautions Precautions: Fall Precaution Comments: cholecystostomy tube; monitor BP (possible ortho hypotension), LLE PWB Restrictions Weight Bearing Restrictions: Yes LLE Weight Bearing: Partial weight bearing LLE Partial Weight Bearing Percentage or Pounds: 50% Pain Pain Assessment Pain Scale: 0-10 Pain Score: 8  ADL ADL Eating: Independent Grooming: Modified independent Where Assessed-Grooming: Sitting at sink Upper Body Bathing: Setup Where Assessed-Upper Body Bathing: Sitting at sink Lower Body Bathing: Supervision/safety Where Assessed-Lower Body Bathing: Standing at sink, Sitting at sink Upper Body Dressing: Setup Where Assessed-Upper  Body Dressing: Sitting at sink Lower Body Dressing: Supervision/safety Toileting: supervision Toilet Transfer: Close supervision Toilet Transfer Method: Counselling psychologist: Bedside commode Vision Baseline Vision/History: 1 Wears glasses Patient Visual Report: No change from baseline Vision Assessment?: No apparent visual deficits Perception  Perception: Within Functional Limits Praxis Praxis: Intact Cognition Overall Cognitive Status: Within Functional Limits for tasks assessed Arousal/Alertness: Awake/alert Orientation Level: Oriented X4 Selective Attention: Appears intact Memory: Appears intact Awareness: Appears intact Problem Solving: Appears intact Safety/Judgment: Appears intact Sensation Sensation Light Touch: Impaired Detail Coordination Gross Motor Movements are Fluid and Coordinated: No Fine Motor Movements are Fluid and Coordinated: Yes Coordination and Movement Description: deficits s/p L ORIF with PWB and pain with mobility at times but greatly improved from eval Motor  Motor Motor: Abnormal postural alignment and control Motor - Skilled Clinical Observations: impaired 2/2 pain which fluctuates, much improved from eval Mobility  Transfers Sit to Stand: Supervision/Verbal cueing Stand to Sit: Supervision/Verbal cueing  Trunk/Postural Assessment  Cervical Assessment Cervical Assessment: Within Functional Limits Thoracic Assessment Thoracic Assessment: Within Functional Limits Lumbar Assessment Lumbar Assessment: Exceptions to [facility] Postural Control Postural Control: Within Functional Limits  Balance Balance Balance Assessed: Yes Dynamic Sitting Balance Dynamic Sitting - Balance Support: During functional activity;Feet supported Dynamic Sitting - Level of Assistance: 6: Modified independent (Device/Increase time) Static Standing Balance Static Standing - Balance Support: Bilateral upper extremity supported;During functional  activity Static Standing - Level of Assistance: 5: Stand by assistance Dynamic Standing Balance Dynamic Standing - Balance Support: During functional activity Dynamic Standing - Level of Assistance: 5: Stand by assistance Extremity/Trunk Assessment RUE Assessment RUE Assessment: Within Functional Limits General Strength Comments: grossly WFL, reports old RTC tear but still able to use for ADL tasks LUE Assessment LUE Assessment: Within Functional Limits   Viona Gilmore 10/26/2020, 2:02 PM

## 2020-10-26 NOTE — Progress Notes (Signed)
Physical Therapy Weekly Progress Note  Patient Details  Name: Heather [name] MRN: [id number] Date of Birth: Feb 17, 1946  Beginning of progress report period: October 20, 2020 End of progress report period: October 26, 2020  Today's Date: 10/26/2020 PT Individual Time: 0915-1005. 1420-1430 PT Individual Time Calculation (min): 50 min, 10 min  Patient has met 3 of 3 short term goals.  Pt is consitently performing bed mobility and transfers with supervision. Gait with supervision up to 75 ft, limited by pain and fatigue 2/2 to maintaining WB precautions. Pt needs cueing to maintain WB ~25% of the time.   Patient continues to demonstrate the following deficits muscle weakness, decreased cardiorespiratoy endurance, and decreased standing balance, decreased balance strategies, and difficulty maintaining precautions and therefore will continue to benefit from skilled PT intervention to increase functional independence with mobility.  Patient progressing toward long term goals..  Continue plan of care.  PT Short Term Goals Week 1:  PT Short Term Goal 1 (Week 1): Pt will perform least restrictive transfer with min A consistently PT Short Term Goal 1 - Progress (Week 1): Met PT Short Term Goal 2 (Week 1): Pt will ambulate x 50 ft with LRAD and min A PT Short Term Goal 2 - Progress (Week 1): Met PT Short Term Goal 3 (Week 1): Pt will initiate stair training PT Short Term Goal 3 - Progress (Week 1): Met Week 2:  PT Short Term Goal 1 (Week 2): =LTGS d/t ELOS  Skilled Therapeutic Interventions/Progress Updates:    Session 1: pt received in bed and agreeable to therapy. No complaint of pain. Pt's daughter, [name] present throughout session for informal family education. Nsg present at start of session to insert IV.   Pt performed bed mobility and transfers throughout session with supervision and RW, needing occ cues for WB precautions. Gait 3 x 30-40 ft with RW and supervision. Pt was  limited by nausea and fatigue. Stair training on 3" steps using backward technique: x 6 and x 4. Pt's daughter was instructed on and performed guarding technique and moving RW from stair to stair. Returned to pt room, pt performed 2 x 10 Sit to stand with no UE support. Pt with extreme nausea from drinking contrast for pending CT. Given saltine crackers, pt reported this helped some but she needed to return to bed. Nsg present to administer nausea medication at end of session. Pt was left with all needs in reach and alarm active. Missed 25 min of scheduled PT, will attempt to make up time if available.   Session 2: Pt in bed on arrival with her daughter present. Pt reported that she felt "queasy" and was having inc pain in her LLE. Pt declined therapy at this time, but asked about results of her CT. Therapist checked for radiology notes, which stated that drain placement was appropriate. Pt remained in bed and was left with all needs in reach and alarm active. Missed 35 min of scheduled PT.  Therapy Documentation Precautions:  Precautions Precautions: Fall Precaution Comments: cholecystostomy tube; monitor BP (possible ortho hypotension) Restrictions Weight Bearing Restrictions: Yes LLE Weight Bearing: Non weight bearing LLE Partial Weight Bearing Percentage or Pounds: 50 General: PT Amount of Missed Time (min): 25 Minutes PT Missed Treatment Reason: Patient fatigue;Patient ill (Comment) (extreme nausea after drinking contrast for CT)   Therapy/Group: Individual Therapy  Mickel Fuchs 10/26/2020, 10:17 AM

## 2020-10-26 NOTE — Progress Notes (Deleted)
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  Past Medical History    Past Medical History:  Diagnosis Date   Anxiety    Closed right ankle fracture    Complication of anesthesia    Depression    Headache    Hyperlipidemia    Hypertension    IBS (irritable bowel syndrome)    PONV (postoperative nausea and vomiting)    Seasonal allergies    Past Surgical History:  Procedure Laterality Date   APPENDECTOMY     IR PERC CHOLECYSTOSTOMY  10/12/2020   JOINT REPLACEMENT Bilateral    TKA   LEFT HEART CATH AND CORONARY ANGIOGRAPHY N/A 10/11/2020   Procedure: LEFT HEART CATH AND CORONARY ANGIOGRAPHY;  Surgeon: Swazil[place], Peter M, MD;  Location: [facility] INVASIVE CV LAB;  Service: Cardiovascular;  Laterality: N/A;   LUMBAR LAMINECTOMY/DECOMPRESSION MICRODISCECTOMY N/A 05/15/2020   Procedure: LUMBAR LAMINECTOMY/DECOMPRESSION OF Lumbar three;  Surgeon: Maeola Harman, MD;  Location: [facility] OR;  Service: Neurosurgery;  Laterality: N/A;   LUMBAR PERCUTANEOUS PEDICLE SCREW 2 LEVEL N/A 05/15/2020   Procedure: LUMBAR PERCUTANEOUS SCREW, Lumbar one - Lumbar two, Lumbar four - Lumbar five;  Surgeon: Maeola Harman, MD;  Location: [facility] OR;  Service: Neurosurgery;  Laterality: N/A;   ORIF ANKLE FRACTURE Right 06/05/2018   Procedure: OPEN REDUCTION INTERNAL FIXATION (ORIF) ANKLE FRACTURE;  Surgeon: Sheral Apley, MD;  Location: [facility];  Service: Orthopedics;  Laterality: Right;   ORIF FEMUR FRACTURE Left 10/13/2020   Procedure: OPEN REDUCTION INTERNAL FIXATION (ORIF) DISTAL FEMUR FRACTURE;  Surgeon: Roby Lofts, MD;  Location: [facility] OR;  Service: Orthopedics;  Laterality: Left;    Allergies  Allergies  Allergen  Reactions   Augmentin [Amoxicillin-Pot Clavulanate]     Has tolerated Ancef and Keflex    History of Present Illness    Heather [name] has a PMH of HTN, asthma, bilateral knee replacement, crest syndrome, and hyperlipidemia.  Echocardiogram 10/10/2020 showed mid-apical anterior lateral and posterior lateral hypokinesis, LVH 50-55%, G2 DD, elevated LVEDP, mildly dilated left atria and no valvular abnormalities.  She was admitted to the hospital 10/10/2020 and discharged on 10/18/2020.  Cardiology was consulted to evaluate for an elevated troponin at the request of Dr. Jacqulyn Bath.  She was noted to have elevated troponin levels and NSTEMI.  She underwent cardiac catheterization which showed minimal nonobstructive CAD.  She was diagnosed with Takotsubo cardiomyopathy.  She was started on metoprolol 25 mg twice daily which was titrated up to metoprolol succinate 50 mg daily.  She was also placed on Entresto 2426 twice daily and spironolactone 12.5 mg daily.  She required ORIF for femur fracture.  Her repair was delayed due to fever.  It was felt her fever was related to acute cholecystitis.  She received a Coley drain.  She presents to the clinic today for follow-up evaluation states***  *** denies chest pain, shortness of breath, lower extremity edema, fatigue, palpitations, melena, hematuria, hemoptysis, diaphoresis, weakness, presyncope, syncope, orthopnea, and PND.   Home Medications    Prior to Admission medications   Medication Sig Start Date End Date Taking? Authorizing Provider  albuterol (VENTOLIN HFA) 108 (90 Base) MCG/ACT inhaler Inhale 2 puffs into the lungs every 6 (six) hours as needed for  wheezing or shortness of breath. 07/12/19   [provider]  aspirin EC 81 MG EC tablet Take 1 tablet (81 mg total) by mouth daily. Swallow whole. 10/19/20 11/18/20  Zigmund Daniel., MD  buPROPion (WELLBUTRIN XL) 300 MG 24 hr tablet Take 300 mg by mouth daily.    [provider]  dicyclomine (BENTYL) 20 MG tablet Take 20 mg by mouth every 6 (six) hours as needed for spasms. 02/08/20   [provider]  fluticasone (FLONASE) 50 MCG/ACT nasal spray Place 1 spray into both nostrils daily.    [provider]  gabapentin (NEURONTIN) 300 MG capsule Take 300-600 mg by mouth 2 (two) times daily. Take 1 capsule (300 mg) in the morning & Take 2 capsules (600 mg) at bedtime    [provider]  HYDROcodone-acetaminophen (NORCO/VICODIN) 5-325 MG tablet Take 1-2 tablets by mouth every 4 (four) hours as needed for severe pain ((score 7 to 10)). 05/24/20   Maeola Harman, MD  methocarbamol (ROBAXIN) 500 MG tablet Take 500 mg by mouth every 12 (twelve) hours as needed for muscle spasms.    [provider]  metoprolol succinate (TOPROL-XL) 50 MG 24 hr tablet Take 1 tablet (50 mg total) by mouth daily. Take with or immediately following a meal. 10/19/20 11/18/20  Zigmund Daniel., MD  montelukast (SINGULAIR) 10 MG tablet Take 10 mg by mouth at bedtime.    [provider]  omeprazole (PRILOSEC) 40 MG capsule Take 1 capsule (40 mg total) by mouth daily. 12/29/19   Nyoka Cowden, MD  ondansetron (ZOFRAN-ODT) 4 MG disintegrating tablet Take 4 mg by mouth every 8 (eight) hours as needed for nausea or vomiting. 10/08/20   [provider]  rosuvastatin (CRESTOR) 10 MG tablet Take 10 mg by mouth daily.    [provider]  sacubitril-valsartan (ENTRESTO) 24-26 MG Take 1 tablet by mouth 2 (two) times daily. 10/18/20 11/17/20  Zigmund Daniel., MD  spironolactone (ALDACTONE) 25 MG tablet Take 0.5 tablets (12.5 mg total) by mouth daily. 10/19/20 11/18/20  Zigmund Daniel., MD  tacrolimus (PROTOPIC) 0.03 % ointment Apply 1 application topically 2 (two) times daily. 02/25/20   [provider]  traZODone (DESYREL) 150 MG tablet Take 150 mg by mouth at bedtime. 09/19/20   [provider]    Family History     Family History  Problem Relation Age of Onset   Hypertension Other    She indicated that the status of her other is unknown.  Social History    Social History   Socioeconomic History   Marital status: Single    Spouse name: Not on file   Number of children: Not on file   Years of education: Not on file   Highest education level: Not on file  Occupational History   Not on file  Tobacco Use   Smoking status: Former   Smokeless tobacco: Never  Substance and Sexual Activity   Alcohol use: Not Currently    Comment: quit 22 yrs ago   Drug use: Never   Sexual activity: Not on file  Other Topics Concern   Not on file  Social History Narrative   ** Merged History Encounter **       Social Determinants of Health   Financial Resource Strain: Not on file  Food Insecurity: Not on file  Transportation Needs: Not on file  Physical Activity: Not on file  Stress: Not on file  Social Connections:  Not on file  Intimate Partner Violence: Not on file     Review of Systems    General:  No chills, fever, night sweats or weight changes.  Cardiovascular:  No chest pain, dyspnea on exertion, edema, orthopnea, palpitations, paroxysmal nocturnal dyspnea. Dermatological: No rash, lesions/masses Respiratory: No cough, dyspnea Urologic: No hematuria, dysuria Abdominal:   No nausea, vomiting, diarrhea, bright red blood per rectum, melena, or hematemesis Neurologic:  No visual changes, wkns, changes in mental status. All other systems reviewed and are otherwise negative except as noted above.  Physical Exam    VS:  There were no vitals taken for this visit. , BMI There is no height or weight on file to calculate BMI. GEN: Well nourished, well developed, in no acute distress. HEENT: normal. Neck: Supple, no JVD, carotid bruits, or masses. Cardiac: RRR, no murmurs, rubs, or gallops. No clubbing, cyanosis, edema.  Radials/DP/PT 2+ and equal bilaterally.  Respiratory:  Respirations regular  and unlabored, clear to auscultation bilaterally. GI: Soft, nontender, nondistended, BS + x 4. MS: no deformity or atrophy. Skin: warm and dry, no rash. Neuro:  Strength and sensation are intact. Psych: Normal affect.  Accessory Clinical Findings    Recent Labs: 10/09/2020: B Natriuretic Peptide 417.2; TSH 3.324 10/18/2020: Magnesium 2.0 10/19/2020: ALT 15 10/24/2020: BUN 8; Creatinine, Ser 0.72; Potassium 4.8; Sodium 136 10/25/2020: Hemoglobin 11.6; Platelets 583   Recent Lipid Panel No results found for: CHOL, TRIG, HDL, CHOLHDL, VLDL, LDLCALC, LDLDIRECT  ECG personally reviewed by me today- *** - No acute changes  Echocardiogram 10/10/2020 IMPRESSIONS     1. Mild mid-apical anterolateral and posterolateral hypokinesis. Left  ventricular ejection fraction, by estimation, is 50 to 55%. The left  ventricle has low normal function. The left ventricle demonstrates  regional wall motion abnormalities (see scoring   diagram/findings for description). There is mild concentric left  ventricular hypertrophy. Left ventricular diastolic parameters are  consistent with Grade II diastolic dysfunction (pseudonormalization).  Elevated left ventricular end-diastolic pressure.   2. Right ventricular systolic function is normal. The right ventricular  size is normal. There is mildly elevated pulmonary artery systolic  pressure.   3. Left atrial size was mildly dilated.   4. The mitral valve is normal in structure. No evidence of mitral valve  regurgitation. No evidence of mitral stenosis.   5. The aortic valve is tricuspid. Aortic valve regurgitation is not  visualized. No aortic stenosis is present.   6. The inferior vena cava is normal in size with greater than 50%  respiratory variability, suggesting right atrial pressure of 3 mmHg.   Cardiac catheterization 10/11/2020   Prox LAD lesion is 30% stenosed.   Prox RCA to Mid RCA lesion is 20% stenosed.   LV end diastolic pressure is normal.    Minimal nonobstructive CAD Normal LVEDP   Plan: medical management.  Diagnostic Dominance: Right Intervention  Assessment & Plan   1.  Chest pain/NSTEMI-no episodes of arm neck back or chest discomfort since leaving the hospital.  Cardiac catheterization showed minimal/mild nonobstructive CAD in her RCA and LAD.  Felt to be related to stress-induced cardiomyopathy.  Echocardiogram showed LVEF of 50-55% with mid-apical anterior lateral and posterior lateral hypokinesis. Continue metoprolol, rosuvastatin Heart healthy low-sodium diet-salty 6 given Increase physical activity as tolerated Repeat BMP  Left ventricular dysfunction/cardiomyopathy-no increased activity intolerance or DOE.  Felt to be stress-induced cardiomyopathy. Continue metoprolol, Entresto, spironolactone Heart healthy low-sodium diet Repeat BMP  Essential hypertension-BP today***.  Well-controlled at home. Continue metoprolol,  Entresto, spironolactone Heart healthy low-sodium diet-salty 6 given Increase physical activity as tolerated  Prolonged QT interval-noted to be 449/562 QTC ms.  Not a candidate for agents/medication that may prolong QT.  Acute kidney injury-felt to be related to nausea vomiting diarrhea.  Creatinine improved to 0.81 with hydration.  Acute cholecystitis-fever increased to 102, noted to have elevated lactic acid, received Coley drain. Follows with PCP  Disposition: Follow-up with Dr. Mayford Knife or me in 1-2 months.   Thomasene Ripple. Ariyanna Oien NP-C    10/26/2020, 1:06 PM [facility] [address] Office [phone] Fax [phone]  Notice: This dictation was prepared with Dragon dictation along with smaller phrase technology. Any transcriptional errors that result from this process are unintentional and may not be corrected upon review.  I spent***minutes examining this patient, reviewing medications, and using patient centered shared decision making  involving her cardiac care.  Prior to her visit I spent greater than 20 minutes reviewing her past medical history,  medications, and prior cardiac tests.

## 2020-10-26 NOTE — Progress Notes (Signed)
Occupational Therapy Session Note  Patient Details  Name: Heather [name] MRN: [id number] Date of Birth: Jan 28, 1946  Today's Date: 10/26/2020 OT Individual Time: 9622-2979 OT Individual Time Calculation (min): 45 min  and Today's Date: 10/26/2020 OT Missed Time: 30 Minutes Missed Time Reason: Patient fatigue;Pain   Short Term Goals: Week 1:  OT Short Term Goal 1 (Week 1): Pt will complete ambulatory toilet transfers with min assist OT Short Term Goal 2 (Week 1): Pt will complete LB dressing with min assist with AE as needed OT Short Term Goal 3 (Week 1): Pt will complete 2 grooming tasks in standing to increase activity tolerance/endurance   Skilled Therapeutic Interventions/Progress Updates:    Pt greeted at time of session sleeping side lying in bed with daughter present, also sleeping in room. Pt easily woken with verbal stimuli and agreeable to attempt OT session despite fatigue, saying she worked very hard this morning in other session. No pain initially but later  in session LLE pain increased while sitting and returned to room. Pt stand pivot bed > chair no AD CGA/close supervision and self propel room > gym Supervision. Focused on lower back stretches with 2x10 large therapy ball rolls with static hold at end range, BUE strengthening to tolerance and ROM (reports old R RTC injury) for the following w/ 3# dowel: bicep curl, chest press, FWD and backward circles for 2x12-15 reps. Shoulder rolls and scapular retractions 2x10 each. Pt reporting intense pain in LLE at this time and unable to continue, transported back to room and stand pivot CGA to bed. Sit > supine Supervision and provided ice packs for L knee area. RN present as well at this time for pain medication. Missed 30 mins of OT d/t pain.  Therapy Documentation Precautions:  Precautions Precautions: Fall Precaution Comments: cholecystostomy tube; monitor BP (possible ortho hypotension) Restrictions Weight Bearing  Restrictions: Yes LLE Weight Bearing: Non weight bearing LLE Partial Weight Bearing Percentage or Pounds: 50    Therapy/Group: Individual Therapy  Erasmo Score 10/26/2020, 7:21 AM

## 2020-10-27 ENCOUNTER — Ambulatory Visit: Payer: Medicare HMO | Admitting: General Practice

## 2020-10-27 ENCOUNTER — Inpatient Hospital Stay (HOSPITAL_COMMUNITY): Payer: Medicare HMO

## 2020-10-27 LAB — HEPATIC FUNCTION PANEL
ALT: 41 U/L (ref 0–44)
AST: 40 U/L (ref 15–41)
Albumin: 3.3 g/dL — ABNORMAL LOW (ref 3.5–5.0)
Alkaline Phosphatase: 148 U/L — ABNORMAL HIGH (ref 38–126)
Bilirubin, Direct: 0.1 mg/dL (ref 0.0–0.2)
Indirect Bilirubin: 0.6 mg/dL (ref 0.3–0.9)
Total Bilirubin: 0.7 mg/dL (ref 0.3–1.2)
Total Protein: 6.8 g/dL (ref 6.5–8.1)

## 2020-10-27 LAB — URINE CULTURE

## 2020-10-27 LAB — BASIC METABOLIC PANEL
Anion gap: 7 (ref 5–15)
BUN: 13 mg/dL (ref 8–23)
CO2: 25 mmol/L (ref 22–32)
Calcium: 8.4 mg/dL — ABNORMAL LOW (ref 8.9–10.3)
Chloride: 103 mmol/L (ref 98–111)
Creatinine, Ser: 0.88 mg/dL (ref 0.44–1.00)
GFR, Estimated: >60 mL/min (ref >60)
Glucose, Bld: 121 mg/dL — ABNORMAL HIGH (ref 70–99)
Potassium: 4.7 mmol/L (ref 3.5–5.1)
Sodium: 135 mmol/L (ref 135–145)

## 2020-10-27 NOTE — Progress Notes (Signed)
Patient ID: Heather [name], female   DOB: 02/09/1946, 75 y.o.   MRN: [id number]  Pt OP referral sent to [facility]  [place], [place] [phone]

## 2020-10-27 NOTE — Progress Notes (Signed)
Patient ID: Heather [name], female   DOB: 05/20/1945, 75 y.o.   MRN: [id number]  SW informed pt will not d/c on tomorrow 9/9

## 2020-10-27 NOTE — Progress Notes (Signed)
Occupational Therapy Discharge Summary  Patient Details  Name: Heather [name] MRN: [id number] Date of Birth: 1945/09/03  Today's Date: 10/27/2020 OT Individual Time: 0100-0145 OT Individual Time Calculation (min): 45 min  Pt received supine in bed, c/o 7/10 pain in L leg and agreeable to OT session. Pt aware session will focus on d/c planning and assessment of ADLs at goal level. MD arrived and spoke with daughter via [place]. Daughter will bring clothes tomorrow. Pt completed bed mobility independently supine > EOB with proper management of drain. Used RW with (S) sit > stand and completed stand>pivot to wc with close (S). Able to complete doffing shirt/pants with (S). Needed VC for WB precautions on LLE - pt demonstrates poor understanding of how much weight put through leg and laughs when reminded. Pt used reacher to assist with doffing pants distally. Able to perform UB/LB bathing seated at sink level with (S). Pt reported indigestion, requested ice cubes which were provided by OT. Pt performed UB/LB dressing tasks with (S), using reacher to assist with distal threading of legs. Pt completed sit>stand using RW to transition into bathroom. Pt performed toileting tasks and toilet transfers with (S) demonstrating appropriate use of grab bars and no LOB. No cuing needed for management of drain throughout tx.Completed functional mobility back to bed using RW, and stand>pivot to EOB. Pt left supine with needs met, call bell nearby, bed alarm on.      Kohan Azizi 10/27/2020, 1:20 PM

## 2020-10-27 NOTE — Progress Notes (Signed)
PROGRESS NOTE   Subjective/Complaints:  Pt reports woke up with diarrhea and nausea and stomach bubbling.  Hadn't asked for anti-nausea medicine yet.    ROS:  Pt denies SOB, abd pain, CP, N/V/C/D, and vision changes   Objective:   CT ABDOMEN PELVIS W CONTRAST  Result Date: 10/26/2020 CLINICAL DATA:  Cholecystitis, status post percutaneous cholecystostomy, increasing WBC EXAM: CT ABDOMEN AND PELVIS WITH CONTRAST TECHNIQUE: Multidetector CT imaging of the abdomen and pelvis was performed using the standard protocol following bolus administration of intravenous contrast. CONTRAST:  51mL OMNIPAQUE IOHEXOL 350 MG/ML SOLN, additional oral enteric contrast COMPARISON:  Abdominal ultrasound, 10/12/2020, ultrasound-guided cholecystostomy placement, 10/12/2020 FINDINGS: Lower chest: No acute abnormality. Left coronary artery calcifications. Hepatobiliary: No solid liver abnormality is seen. Percutaneous cholecystostomy tube is position with formed pigtail in the gallbladder fundus (series 3, image 34). Mild gallbladder wall thickening and small volume adjacent pericholecystic fluid. The common bile duct is mildly dilated to the ampulla, maximum caliber 0.9 cm centrally. No obvious obstructing calculus or other lesion. Pancreas: Unremarkable. No pancreatic ductal dilatation or surrounding inflammatory changes. Spleen: Normal in size without significant abnormality. Adrenals/Urinary Tract: Adrenal glands are unremarkable. Kidneys are normal, without renal calculi, solid lesion, or hydronephrosis. Bladder is unremarkable. Stomach/Bowel: Stomach is within normal limits. Appendix is not clearly visualized and may be surgically absent. No evidence of bowel wall thickening, distention, or inflammatory changes. Occasional sigmoid diverticula. Vascular/Lymphatic: Aortic atherosclerosis. No enlarged abdominal or pelvic lymph nodes. Reproductive: No mass or other  significant abnormality. Other: No abdominal wall hernia or abnormality. No abdominopelvic ascites. Musculoskeletal: Bridging fusion of L1 through L5 about a sclerotic burst type fracture of the L3 vertebral body (series 7, image 56). IMPRESSION: 1. Percutaneous cholecystostomy tube is positioned with formed pigtail in the gallbladder fundus. Mild gallbladder wall thickening and small volume adjacent pericholecystic fluid, findings consistent with acute cholecystitis. 2. The common bile duct is mildly dilated to the ampulla, maximum caliber 0.9 cm centrally. No obvious obstructing calculus or other lesion. Consider ERCP/MRCP to further evaluate for centrally obstructing etiology, particularly given that gallstones were reported on prior ultrasound but radiopaque on current CT examination. 3. Bridging fusion of L1 through L5 about a sclerotic burst type fracture of the L3 vertebral body. 4. Coronary artery disease. Aortic Atherosclerosis (ICD10-I70.0). Electronically Signed   By: Lauralyn Primes M.D.   On: 10/26/2020 14:03   DG FEMUR MIN 2 VIEWS LEFT  Result Date: 10/27/2020 CLINICAL DATA:  Fracture EXAM: LEFT FEMUR 2 VIEWS COMPARISON:  10/13/2020 FINDINGS: Redemonstrated, comminuted fractures of the distal left femoral metadiaphysis, status post plate and screw fixation of the lateral distal left femur and total knee arthroplasty. No change in fracture fragment alignment. Radiographic findings are not significantly changed compared to prior examination dated 10/13/2020. Soft tissues are unremarkable. IMPRESSION: Redemonstrated, comminuted fractures of the distal left femoral metadiaphysis, status post plate and screw fixation of the lateral distal left femur and total knee arthroplasty. No change in fracture fragment alignment. Electronically Signed   By: Lauralyn Primes M.D.   On: 10/27/2020 10:25   Recent Labs    10/25/20 0505  WBC 13.1*  HGB 11.6*  HCT 36.3  PLT  583*   No results for input(s): NA, K, CL,  CO2, GLUCOSE, BUN, CREATININE, CALCIUM in the last 72 hours.   Intake/Output Summary (Last 24 hours) at 10/27/2020 1216 Last data filed at 10/27/2020 0817 Gross per 24 hour  Intake 725 ml  Output 50 ml  Net 675 ml        Physical Exam: Vital Signs Blood pressure 115/68, pulse 93, temperature (!) 97.5 F (36.4 C), temperature source Oral, resp. rate 20, height  (1.676 m), weight 69.9 kg, SpO2 92 %.      General: awake, alert, appropriate, laying supine in bed; sleepy; NAD HENT: conjugate gaze; oropharynx moist CV: regular rate; no JVD Pulmonary: CTA B/L; no W/R/R- good air movement GI: soft, slightly TTP around perc drain- c/o nausea; no rebound; hypoactive BS Psychiatric: appropriate Neurological: Ox3  Musculoskeletal:     Cervical back: Normal range of motion. No rigidity.     Comments: Left thigh incisions C/D/I with skin glue. Incisions looks great on L distal thigh Strength 5-/5 in UE B/L as well as RLE 5-/5; LLE- 4/5 HF/KE/KF and 5-/5 DF and PF  Skin:    General: Skin is warm and dry.     Comments: L thigh incision distally healing great- no drainage, no erythema; just a few scabs Neurological:     Mental Status: She is alert and oriented to person, place, and time.    Assessment/Plan: 1. Functional deficits which require 3+ hours per day of interdisciplinary therapy in a comprehensive inpatient rehab setting. Physiatrist is providing close team supervision and 24 hour management of active medical problems listed below. Physiatrist and rehab team continue to assess barriers to discharge/monitor patient progress toward functional and medical goals  Care Tool:  Bathing    Body parts bathed by patient: Right arm, Left arm, Chest, Abdomen, Face, Front perineal area, Right upper leg, Left upper leg, Right lower leg, Left lower leg, Buttocks   Body parts bathed by helper: Buttocks, Left lower leg     Bathing assist Assist Level: Supervision/Verbal cueing      Upper Body Dressing/Undressing Upper body dressing   What is the patient wearing?: Pull over shirt    Upper body assist Assist Level: Set up assist    Lower Body Dressing/Undressing Lower body dressing      What is the patient wearing?: Pants     Lower body assist Assist for lower body dressing: Supervision/Verbal cueing     Toileting Toileting    Toileting assist Assist for toileting: Moderate Assistance - Patient 50 - 74%     Transfers Chair/bed transfer  Transfers assist     Chair/bed transfer assist level: Supervision/Verbal cueing     Locomotion Ambulation   Ambulation assist      Assist level: Supervision/Verbal cueing Assistive device: Walker-rolling Max distance: 35'   Walk 10 feet activity   Assist  Walk 10 feet activity did not occur: Safety/medical concerns  Assist level: Supervision/Verbal cueing Assistive device: Walker-rolling   Walk 50 feet activity   Assist Walk 50 feet with 2 turns activity did not occur: Safety/medical concerns         Walk 150 feet activity   Assist Walk 150 feet activity did not occur: Safety/medical concerns         Walk 10 feet on uneven surface  activity   Assist Walk 10 feet on uneven surfaces activity did not occur: Safety/medical concerns         Wheelchair  Assist Is the patient using a wheelchair?: Yes Type of Wheelchair: Manual    Wheelchair assist level: Supervision/Verbal cueing Max wheelchair distance: 150    Wheelchair 50 feet with 2 turns activity    Assist        Assist Level: Supervision/Verbal cueing   Wheelchair 150 feet activity     Assist      Assist Level: Supervision/Verbal cueing   Blood pressure 115/68, pulse 93, temperature (!) 97.5 F (36.4 C), temperature source Oral, resp. rate 20, height 5\' 6"  (1.676 m), weight 69.9 kg, SpO2 92 %.  Medical Poblem List and Plan: 1. L distal femur fx s/p ORIF secondary to multiple falls- with  complicated medical issues- PWB on LLE             -patient may not shower until abd drain removed?             -ELOS/Goals: 10-14 days- supervision  Con't PT and OT- d/c date set 9/9 if medically ready.   9/8- d/c tomorrow IF GI is OK with d/c- consult pending.  2.  Impaired mobility: -DVT/anticoagulation:  Pharmaceutical: Continue Lovenox             -antiplatelet therapy: ASA 3. Pain: continue  Hydrocodone prn.   9/7- only having pain around drain- con't prns 4. Mood: LCSW to follow for evaluation and support.              -antipsychotic agents: N/A 5. Neuropsych: This patient is capable of making decisions on her own behalf. 6. Skin/Wound Care: Monitor incision for healing.  7. Fluids/Electrolytes/Nutrition: Monitor I/O.              --Hypophosphatemia/hypomagnesemia improving post supplement.             --add juven for low protein stores.  8. Left distal femur Fx s/p ORIF: PWB LLE--continue to reinforce precautions 9. 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 Episode of SVT: Treated with IV BB--->po Toprol  9/7- BP and HR controlled- con't regimen 11. Sepsis due to cholecystitis: Perc drain in place with Keflex/Flagyl thorough 09/02             --Leucocytosis continues to fluctuate  9/1- WBC 12k- will recheck in AM- is afebrile and feeling BETTER- so will wait til CBC for AM done.   9/2- WBC is 12.3k- Tm 99.9- no abd pain; U/A (-) 8/28- but on Keflex til today for skin- Hip incision looks OK- Dopplers (-)- no resp Sx's; don't think C diff since on Miralax BID and Colace BID and just had ileus- will recheck labs in AM and see if reducing bowel meds helps bowel issues?Likely cholecystitis- but looks nontoxic- might need Ct of abd/pelvis Saturday if WBC goes up.   9/3: WBC down to 10  9/5- WBC back up to 13k- afebrile- no abd pain currently- will recheck in AM CBC-diff and if  still elevated, will get another CT scan  9/6- wbc IS STILL 13.1K- WILL CHECK U/A and Cx- and if negative, will then get CT of abd/pelvis.   9/7- U/A negative- will check CT of abd pelvis with contrast- needs to [facility] what's going on- pt mentioned someone came about drain- no note  9/8- CT shows dilated CBD- still c/w acute cholecystitis- suggest MRCP/ERCP- have consulted GI to see pt and determine plan.  12. Acute renal  failure: SCr 2.21 at admission and has resolved-->0.75 9/1- Cr 0.81- stable- con't to monitor  13. Acute blood loss anemia: Continue to monitor H/H.   9/5- Hb up to 11.5- doing better- con't to monitor 14. 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   15. Chronic insomnia: Continue Trazodone at bedtime.   9/2- sleeping well- con't regimen 16. OSA: Resume CPAP. 17. Abdominal pain: Has had diarrhea X 3 today--question due to diet v/s issues with drain              --Monitor for now. Advised on avoiding fatty foods. 9/1- see # 18 9/2- Abd pain resolved, however WBC 12.3- might need CT of abd and pelvis if WBC spikes/goes up more tomorrow. 9/4: WBC down to 10- denies any more abdominal pain 9/5- abd pain resolved- just raw on anal area- will order more Anusol.   9/6- will check U/A and Cx- and if negative, get another CT scan.  18. Ileus 9/1- got cleaned out yesterday per staff- on liquid diet-  4 stools since yesterday AM- 1 formed; 3 liquid- KUB pending for this AM. Won't change liquid diet until know ileus has improved.   9/2-resolved- now with loose stools.   9/3: feels the reglan is contributing to loose stools and requests stopping it. Lamotil PRN ordered for loose stools  9/7- diarrhea has resolved it appears.   9/8- diarrhea again this AM- likely cholecysitits  I spent a total of of 36 minutes on total care- >50% on coordination of care- d/w PA and GI consult       LOS: 9 days A FACE TO FACE EVALUATION WAS PERFORMED  Kalynne Womac 10/27/2020, 12:16 PM

## 2020-10-27 NOTE — Progress Notes (Signed)
Physical Therapy Session Note  Patient Details  Name: Heather [name] MRN: [id number] Date of Birth: 1945-05-07  Today's Date: 10/27/2020 PT Individual Time: 2376-2831 PT Individual Time Calculation (min): 74 min   Short Term Goals: Week 2:  PT Short Term Goal 1 (Week 2): =LTGS d/t ELOS  Skilled Therapeutic Interventions/Progress Updates: Pt presents supine in bed and c/o recurrent diarrhea this AM.  Spoke w/ nursing and will receive meds for same.  Pt agreeable to participate in bed there ex.  Pt performed 3 x 10-15 AP, QS, isometric add, HS, abd/add.  Pt L knee flexion measured goniometrically at 76 degrees AROM w/ overpressure.  Pt then agreeable to increase mobility w/o incident.  Pt transfers sup to sit w/ mod I using siderail, pt stating has siderail attached to bed at home.  Pt transfers sit to stand w/ supervision, w/ occasional reminders for 50% WB LLE.  Pt transfers bed> w/c w/ supervision and RW.  Pt amb multiple trials w/ RW and supervision, verbal cues for WB.  Pt educated on possibility to perform w/ less WB, just not more than 50%.  Pt negotiated 4 3" steps w/ B rails and CGA.  Pt able to pick up up cup from floor w/ CGA.  Pt returned to room and bed w/ supervision.  Bed alarm on and all needs in reach.     Therapy Documentation Precautions:  Precautions Precautions: Fall Precaution Comments: cholecystostomy tube; monitor BP (possible ortho hypotension), LLE PWB Restrictions Weight Bearing Restrictions: Yes LLE Weight Bearing: Partial weight bearing LLE Partial Weight Bearing Percentage or Pounds: 50% General:   Vital Signs:  Pain:0/10 w/o activity, but increases w/ activity, unable to quantify. Pain Assessment Pain Score: 2  Faces Pain Scale: No hurt PAINAD (Pain Assessment in Advanced Dementia) Breathing: normal Negative Vocalization: none Facial Expression: smiling or inexpressive Body Language: relaxed Consolability: no need to console PAINAD Score:  0 Mobility:      Therapy/Group: Individual Therapy  Lucio Edward 10/27/2020, 11:01 AM

## 2020-10-27 NOTE — Progress Notes (Signed)
Physical Therapy Discharge Summary  Patient Details  Name: Heather [name] MRN: [id number] Date of Birth: October 31, 1945  Today's Date: 10/27/2020 PT Individual Time: 1345-1430 PT Individual Time Calculation (min): 45 min    Patient has met 6 of 8 long term goals due to improved activity tolerance, improved balance, increased strength, increased range of motion, decreased pain, and ability to compensate for deficits.  Patient to discharge at an ambulatory level Supervision.   Patient's care partner is independent to provide the necessary physical assistance at discharge. Bed mobility mod I, transfers with supervision. Family education performed on stair navigation and pt's daughter demonstrated appropriate guarding techniques using backwards technique with RW. Pt was often limited by pain and medical issues, gait limited to 40 ft with RW and supervision.   Reasons goals not met: Pt did not meet gait goals as she is only walking up to 40 ft with RW and supervision. Pt limited by fatigue and nausea during stay.   Recommendation:  Patient will benefit from ongoing skilled PT services in outpatient setting to continue to advance safe functional mobility, address ongoing impairments in strength, endurance, and minimize fall risk.  Equipment: W/c  Reasons for discharge: treatment goals met and discharge from hospital  Patient/family agrees with progress made and goals achieved: Yes  Skilled Therapeutic Interventions/Progress Updates:  pt received in bed and agreeable to therapy. Pt reports some pain in RLE, addressed with rest and ice pack at end of session. Pt performed all mobility with supervision and RW. Session focused on assessing gait distance for d/c readiness.   ambulatory transfer to w/c with RW and supervision. Pt required occ cueing for WB precautions throughout. Gait x 40 ft, x 35 ft, x 25 ft. Pt demoes antalgic gait and step to pattern, limited by fatigue and shoulder pain d/t  using RW to maintain WB precautions. Pt navigated 8" curb step with supervision and backwards technique with RW. Pt propelled w/c with BUE back to room, including managing w/c parts to demonstrate independence for d/c. Pt performed 5x Sit to stand=26 sec, improved from 38 sec at eval.   Pt returned to bed after session and was left with all needs in reach and alarm active, ice pack in place.   PT Discharge Precautions/Restrictions Precautions Precautions: Fall Precaution Comments: cholecystostomy tube; monitor BP (possible ortho hypotension), LLE PWB Restrictions Weight Bearing Restrictions: Yes LLE Weight Bearing: Partial weight bearing LLE Partial Weight Bearing Percentage or Pounds: 50%   Pain Interference Pain Interference Pain Effect on Sleep: 3. Frequently Pain Interference with Therapy Activities: 3. Frequently Pain Interference with Day-to-Day Activities: 2. Occasionally Vision/Perception  Praxis Praxis: Intact  Cognition Overall Cognitive Status: Within Functional Limits for tasks assessed Arousal/Alertness: Awake/alert Orientation Level: Oriented X4 Year: 2022 Month: September Day of Week: Correct Attention: Selective Selective Attention: Appears intact Memory: Appears intact Awareness: Appears intact Problem Solving: Appears intact Safety/Judgment: Appears intact Sensation Sensation Light Touch: Impaired Detail Peripheral sensation comments: impaired sensation L lateral ankle Light Touch Impaired Details: Impaired LLE Hot/Cold: Appears Intact Proprioception: Appears Intact Stereognosis: Not tested Coordination Gross Motor Movements are Fluid and Coordinated: No Fine Motor Movements are Fluid and Coordinated: Yes Coordination and Movement Description: deficits s/p L ORIF with PWB and pain with mobility at times but greatly improved from eval Motor  Motor Motor: Abnormal postural alignment and control Motor - Skilled Clinical Observations: impaired 2/2  pain which fluctuates, much improved from eval  Mobility Bed Mobility Bed Mobility: Rolling Right;Rolling Left;Supine to Sit;Sit to  Supine Rolling Right: Independent with assistive device Rolling Left: Independent with assistive device Supine to Sit: Independent with assistive device Sit to Supine: Independent with assistive device Sit to Sidelying Right: Independent with assistive device Transfers Transfers: Sit to Stand;Stand Pivot Transfers;Stand to Sit Sit to Stand: Supervision/Verbal cueing Stand to Sit: Supervision/Verbal cueing Stand Pivot Transfers: Supervision/Verbal cueing Stand Pivot Transfer Details: Verbal cues for technique;Verbal cues for safe use of DME/AE;Verbal cues for precautions/safety Stand Pivot Transfer Details (indicate cue type and reason): Cues for WB precautions Transfer (Assistive device): Rolling walker Locomotion  Gait Ambulation: Yes Gait Assistance: Supervision/Verbal cueing Gait Distance (Feet): 40 Feet Assistive device: Rolling walker Gait Assistance Details: Verbal cues for precautions/safety;Verbal cues for safe use of DME/AE Gait Gait: Yes Gait Pattern: Impaired (antalgic, step to pattern) Gait velocity: decreased Stairs / Additional Locomotion Stairs: Yes Stairs Assistance: Contact Guard/Touching assist Stair Management Technique: Two rails Number of Stairs: 4 Height of Stairs: 3 Pick up small object from the floor assist level: Contact Guard/Touching assist Pick up small object from the floor assistive device: RW Wheelchair Mobility Wheelchair Mobility: Yes Wheelchair Assistance: Chartered loss adjuster: Both upper extremities Wheelchair Parts Management: Supervision/cueing Distance: 150 ft  Trunk/Postural Assessment  Cervical Assessment Cervical Assessment: Within Functional Limits Thoracic Assessment Thoracic Assessment: Within Functional Limits Lumbar Assessment Lumbar Assessment: Within Functional  Limits Postural Control Postural Control: Within Functional Limits  Balance Balance Balance Assessed: Yes Dynamic Sitting Balance Dynamic Sitting - Balance Support: During functional activity;Feet supported Dynamic Sitting - Level of Assistance: 6: Modified independent (Device/Increase time) Static Standing Balance Static Standing - Balance Support: Bilateral upper extremity supported;During functional activity Static Standing - Level of Assistance: 5: Stand by assistance Dynamic Standing Balance Dynamic Standing - Balance Support: During functional activity Dynamic Standing - Level of Assistance: 5: Stand by assistance Extremity Assessment      RLE Assessment RLE Assessment: Within Functional Limits General Strength Comments: 5/5 grossly LLE Assessment LLE Assessment: Exceptions to [facility] Passive Range of Motion (PROM) Comments: decreased hip and knee ROM 2/2 pain General Strength Comments: impaired 2/2 pain, see below LLE Strength Left Hip Flexion: 3+/5 Left Knee Flexion: 4-/5 Left Knee Extension: 4-/5 Left Ankle Dorsiflexion: 4/5    Heather [name] C Heather [name] 10/27/2020, 4:51 PM
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
## 2020-10-28 ENCOUNTER — Other Ambulatory Visit (HOSPITAL_COMMUNITY): Payer: Self-pay

## 2020-10-28 DIAGNOSIS — I5181 Takotsubo syndrome: Secondary | ICD-10-CM

## 2020-10-28 DIAGNOSIS — I214 Non-ST elevation (NSTEMI) myocardial infarction: Secondary | ICD-10-CM

## 2020-10-28 DIAGNOSIS — R197 Diarrhea, unspecified: Secondary | ICD-10-CM

## 2020-10-28 MED ORDER — METOPROLOL SUCCINATE ER 50 MG PO TB24: 50.0000 mg | ORAL_TABLET | Freq: Every day | ORAL | 1 refills | Status: DC

## 2020-10-28 MED ORDER — SACUBITRIL-VALSARTAN 24-26 MG PO TABS: 1.0000 | ORAL_TABLET | Freq: Two times a day (BID) | ORAL | 0 refills | Status: DC

## 2020-10-28 MED ORDER — HYDROCORTISONE (PERIANAL) 2.5 % EX CREA
1.0000 "application " | TOPICAL_CREAM | Freq: Four times a day (QID) | CUTANEOUS | 2 refills | Status: DC
  Filled 2020-10-28: qty 30, 14d supply, fill #0

## 2020-10-28 MED ORDER — HYDROCODONE-ACETAMINOPHEN 5-325 MG PO TABS
1.0000 | ORAL_TABLET | Freq: Four times a day (QID) | ORAL | 0 refills | Status: DC | PRN
  Filled 2020-10-28: qty 52, 7d supply, fill #0

## 2020-10-28 MED ORDER — OXYCODONE HCL 5 MG PO TABS
5.0000 mg | ORAL_TABLET | Freq: Two times a day (BID) | ORAL | 0 refills | Status: DC | PRN
  Filled 2020-10-28: qty 7, 4d supply, fill #0

## 2020-10-28 MED ORDER — ADULT MULTIVITAMIN W/MINERALS CH: 1.0000 | ORAL_TABLET | Freq: Every day | ORAL | Status: DC

## 2020-10-28 MED ORDER — SACUBITRIL-VALSARTAN 24-26 MG PO TABS
1.0000 | ORAL_TABLET | Freq: Two times a day (BID) | ORAL | 0 refills | Status: AC
  Filled 2020-10-28: qty 60, 30d supply, fill #0

## 2020-10-28 MED ORDER — METOPROLOL SUCCINATE ER 50 MG PO TB24
50.0000 mg | ORAL_TABLET | Freq: Every day | ORAL | 1 refills | Status: DC
  Filled 2020-10-28: qty 30, 30d supply, fill #0

## 2020-10-28 MED ORDER — TRAZODONE HCL 50 MG PO TABS
25.0000 mg | ORAL_TABLET | Freq: Every evening | ORAL | 0 refills | Status: DC | PRN
  Filled 2020-10-28: qty 30, 60d supply, fill #0

## 2020-10-28 MED ORDER — DIPHENOXYLATE-ATROPINE 2.5-0.025 MG PO TABS
2.0000 | ORAL_TABLET | Freq: Four times a day (QID) | ORAL | 0 refills | Status: DC | PRN
  Filled 2020-10-28: qty 30, 4d supply, fill #0

## 2020-10-28 MED ORDER — SPIRONOLACTONE 25 MG PO TABS
12.5000 mg | ORAL_TABLET | Freq: Every day | ORAL | 0 refills | Status: DC
  Filled 2020-10-28: qty 15, 30d supply, fill #0

## 2020-10-28 MED ORDER — APIXABAN 2.5 MG PO TABS
2.5000 mg | ORAL_TABLET | Freq: Two times a day (BID) | ORAL | 0 refills | Status: DC
  Filled 2020-10-28: qty 30, 15d supply, fill #0

## 2020-10-28 NOTE — Progress Notes (Signed)
PROGRESS NOTE   Subjective/Complaints:  Pt reports woke up with diarrhea and nausea and stomach bubbling.  Hadn't asked for anti-nausea medicine yet.    ROS:  Pt denies SOB, abd pain, CP, N/V/C/D, and vision changes   Objective:   CT ABDOMEN PELVIS W CONTRAST  Result Date: 10/26/2020 CLINICAL DATA:  Cholecystitis, status post percutaneous cholecystostomy, increasing WBC EXAM: CT ABDOMEN AND PELVIS WITH CONTRAST TECHNIQUE: Multidetector CT imaging of the abdomen and pelvis was performed using the standard protocol following bolus administration of intravenous contrast. CONTRAST:  70mL OMNIPAQUE IOHEXOL 350 MG/ML SOLN, additional oral enteric contrast COMPARISON:  Abdominal ultrasound, 10/12/2020, ultrasound-guided cholecystostomy placement, 10/12/2020 FINDINGS: Lower chest: No acute abnormality. Left coronary artery calcifications. Hepatobiliary: No solid liver abnormality is seen. Percutaneous cholecystostomy tube is position with formed pigtail in the gallbladder fundus (series 3, image 34). Mild gallbladder wall thickening and small volume adjacent pericholecystic fluid. The common bile duct is mildly dilated to the ampulla, maximum caliber 0.9 cm centrally. No obvious obstructing calculus or other lesion. Pancreas: Unremarkable. No pancreatic ductal dilatation or surrounding inflammatory changes. Spleen: Normal in size without significant abnormality. Adrenals/Urinary Tract: Adrenal glands are unremarkable. Kidneys are normal, without renal calculi, solid lesion, or hydronephrosis. Bladder is unremarkable. Stomach/Bowel: Stomach is within normal limits. Appendix is not clearly visualized and may be surgically absent. No evidence of bowel wall thickening, distention, or inflammatory changes. Occasional sigmoid diverticula. Vascular/Lymphatic: Aortic atherosclerosis. No enlarged abdominal or pelvic lymph nodes. Reproductive: No mass or other  significant abnormality. Other: No abdominal wall hernia or abnormality. No abdominopelvic ascites. Musculoskeletal: Bridging fusion of L1 through L5 about a sclerotic burst type fracture of the L3 vertebral body (series 7, image 56). IMPRESSION: 1. Percutaneous cholecystostomy tube is positioned with formed pigtail in the gallbladder fundus. Mild gallbladder wall thickening and small volume adjacent pericholecystic fluid, findings consistent with acute cholecystitis. 2. The common bile duct is mildly dilated to the ampulla, maximum caliber 0.9 cm centrally. No obvious obstructing calculus or other lesion. Consider ERCP/MRCP to further evaluate for centrally obstructing etiology, particularly given that gallstones were reported on prior ultrasound but radiopaque on current CT examination. 3. Bridging fusion of L1 through L5 about a sclerotic burst type fracture of the L3 vertebral body. 4. Coronary artery disease. Aortic Atherosclerosis (ICD10-I70.0). Electronically Signed   By: Lauralyn Primes M.D.   On: 10/26/2020 14:03   DG FEMUR MIN 2 VIEWS LEFT  Result Date: 10/27/2020 CLINICAL DATA:  Fracture EXAM: LEFT FEMUR 2 VIEWS COMPARISON:  10/13/2020 FINDINGS: Redemonstrated, comminuted fractures of the distal left femoral metadiaphysis, status post plate and screw fixation of the lateral distal left femur and total knee arthroplasty. No change in fracture fragment alignment. Radiographic findings are not significantly changed compared to prior examination dated 10/13/2020. Soft tissues are unremarkable. IMPRESSION: Redemonstrated, comminuted fractures of the distal left femoral metadiaphysis, status post plate and screw fixation of the lateral distal left femur and total knee arthroplasty. No change in fracture fragment alignment. Electronically Signed   By: Lauralyn Primes M.D.   On: 10/27/2020 10:25   No results for input(s): WBC, HGB, HCT, PLT in the last 72 hours.  Recent Labs  10/27/20 1344  NA 135  K 4.7   CL 103  CO2 25  GLUCOSE 121*  BUN 13  CREATININE 0.88  CALCIUM 8.4*     Intake/Output Summary (Last 24 hours) at 10/28/2020 0814 Last data filed at 10/28/2020 5284 Gross per 24 hour  Intake 598 ml  Output 150 ml  Net 448 ml        Physical Exam: Vital Signs Blood pressure 109/67, pulse 82, temperature 99.1 F (37.3 C), temperature source Oral, resp. rate 18, height  (1.676 m), weight 69.1 kg, SpO2 98 %.       General: awake, alert, appropriate but not polite; laying supine in bed;  NAD HENT: conjugate gaze; oropharynx moist CV: regular rate; no JVD Pulmonary: CTA B/L; no W/R/R- good air movement GI: soft, NT, ND, (+)BS; perc drain in abd- no change Psychiatric: appropriate but not polite this AM'grumpy Neurological: Ox3   Musculoskeletal:     Cervical back: Normal range of motion. No rigidity.     Comments: Left thigh incisions C/D/I with skin glue. Incisions looks great on L distal thigh Strength 5-/5 in UE B/L as well as RLE 5-/5; LLE- 4/5 HF/KE/KF and 5-/5 DF and PF  Skin:    General: Skin is warm and dry.     Comments: L thigh incision distally healing great- no drainage, no erythema; just a few scabs Neurological:     Mental Status: She is alert and oriented to person, place, and time.    Assessment/Plan: 1. Functional deficits which require 3+ hours per day of interdisciplinary therapy in a comprehensive inpatient rehab setting. Physiatrist is providing close team supervision and 24 hour management of active medical problems listed below. Physiatrist and rehab team continue to assess barriers to discharge/monitor patient progress toward functional and medical goals  Care Tool:  Bathing    Body parts bathed by patient: Right arm, Left arm, Chest, Abdomen, Face, Front perineal area, Right upper leg, Left upper leg, Right lower leg, Left lower leg, Buttocks   Body parts bathed by helper: Buttocks, Left lower leg     Bathing assist Assist Level:  Supervision/Verbal cueing     Upper Body Dressing/Undressing Upper body dressing   What is the patient wearing?: Pull over shirt    Upper body assist Assist Level: Supervision/Verbal cueing    Lower Body Dressing/Undressing Lower body dressing      What is the patient wearing?: Pants     Lower body assist Assist for lower body dressing: Supervision/Verbal cueing     Toileting Toileting    Toileting assist Assist for toileting: Supervision/Verbal cueing     Transfers Chair/bed transfer  Transfers assist     Chair/bed transfer assist level: Supervision/Verbal cueing     Locomotion Ambulation   Ambulation assist      Assist level: Supervision/Verbal cueing Assistive device: Walker-rolling Max distance: 40 ft   Walk 10 feet activity   Assist  Walk 10 feet activity did not occur: Safety/medical concerns  Assist level: Supervision/Verbal cueing Assistive device: Walker-rolling   Walk 50 feet activity   Assist Walk 50 feet with 2 turns activity did not occur: Safety/medical concerns         Walk 150 feet activity   Assist Walk 150 feet activity did not occur: Safety/medical concerns         Walk 10 feet on uneven surface  activity   Assist Walk 10 feet on uneven surfaces activity did not occur: Safety/medical concerns  Wheelchair     Assist Is the patient using a wheelchair?: Yes Type of Wheelchair: Manual    Wheelchair assist level: Supervision/Verbal cueing Max wheelchair distance: 150    Wheelchair 50 feet with 2 turns activity    Assist        Assist Level: Supervision/Verbal cueing   Wheelchair 150 feet activity     Assist      Assist Level: Supervision/Verbal cueing   Blood pressure 109/67, pulse 82, temperature 99.1 F (37.3 C), temperature source Oral, resp. rate 18, height 5\' 6"  (1.676 m), weight 69.1 kg, SpO2 98 %.  Medical Poblem List and Plan: 1. L distal femur fx s/p ORIF secondary  to multiple falls- with complicated medical issues- PWB on LLE             -patient may not shower until abd drain removed?             -ELOS/Goals: 10-14 days- supervision  Con't PT and OT- d/c date set 9/9 if medically ready.   9/8- d/c tomorrow IF GI is OK with d/c- consult pending.   9/9- GI said will do nothing- will d/c today  2.  Impaired mobility: -DVT/anticoagulation:  Pharmaceutical: Continue Lovenox             -antiplatelet therapy: ASA 3. Pain: continue  Hydrocodone prn.   9/7- only having pain around drain- con't prns 4. Mood: LCSW to follow for evaluation and support.              -antipsychotic agents: N/A 5. Neuropsych: This patient is capable of making decisions on her own behalf. 6. Skin/Wound Care: Monitor incision for healing.  7. Fluids/Electrolytes/Nutrition: Monitor I/O.              --Hypophosphatemia/hypomagnesemia improving post supplement.             --add juven for low protein stores.  8. Left distal femur Fx s/p ORIF: PWB LLE--continue to reinforce precautions 9. 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 Episode of SVT: Treated with IV BB--->po Toprol  9/7- BP and HR controlled- con't regimen 11. Sepsis due to cholecystitis: Perc drain in place with Keflex/Flagyl thorough 09/02             --Leucocytosis continues to fluctuate  9/1- WBC 12k- will recheck in AM- is afebrile and feeling BETTER- so will wait til CBC for AM done.   9/2- WBC is 12.3k- Tm 99.9- no abd pain; U/A (-) 8/28- but on Keflex til today for skin- Hip incision looks OK- Dopplers (-)- no resp Sx's; don't think C diff since on Miralax BID and Colace BID and just had ileus- will recheck labs in AM and see if reducing bowel meds helps bowel issues?Likely cholecystitis- but looks nontoxic- might need Ct of abd/pelvis Saturday if WBC goes up.   9/3: WBC down to 10  9/5- WBC back up to 13k-  afebrile- no abd pain currently- will recheck in AM CBC-diff and if still elevated, will get another CT scan  9/6- wbc IS STILL 13.1K- WILL CHECK U/A and Cx- and if negative, will then get CT of abd/pelvis.   9/7- U/A negative- will check CT of abd pelvis with contrast- needs to [facility] what's going on- pt mentioned someone came about drain- no note  9/8- CT shows dilated CBD- still c/w acute cholecystitis-  suggest MRCP/ERCP- have consulted GI to see pt and determine plan.   9/9- GI saw- no additional testing/treatment- will go home with f/u with surgery 12. Acute renal failure: SCr 2.21 at admission and has resolved-->0.75 9/1- Cr 0.81- stable- con't to monitor  13. Acute blood loss anemia: Continue to monitor H/H.   9/5- Hb up to 11.5- doing better- con't to monitor 14. 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   15. Chronic insomnia: Continue Trazodone at bedtime.   9/2- sleeping well- con't regimen 16. OSA: Resume CPAP. 17. Abdominal pain: Has had diarrhea X 3 today--question due to diet v/s issues with drain              --Monitor for now. Advised on avoiding fatty foods. 9/1- see # 18 9/2- Abd pain resolved, however WBC 12.3- might need CT of abd and pelvis if WBC spikes/goes up more tomorrow. 9/4: WBC down to 10- denies any more abdominal pain 9/5- abd pain resolved- just raw on anal area- will order more Anusol.   9/6- will check U/A and Cx- and if negative, get another CT scan.  18. Ileus 9/1- got cleaned out yesterday per staff- on liquid diet-  4 stools since yesterday AM- 1 formed; 3 liquid- KUB pending for this AM. Won't change liquid diet until know ileus has improved.   9/2-resolved- now with loose stools.   9/3: feels the reglan is contributing to loose stools and requests stopping it. Lamotil PRN ordered for loose stools  9/7- diarrhea has resolved it appears.   9/8- diarrhea again this AM- likely cholecysitits      LOS: 10 days A FACE TO FACE EVALUATION WAS  PERFORMED  Heather [name] 10/28/2020, 8:14 AM

## 2020-10-28 NOTE — Progress Notes (Signed)
Repeat imaging left femur done on 10/27/20 is stable. No signs of hardware failure/loosening. Fracture remains in appropriate alignment. Patient stable for discharge from orthopaedic standpoint. Follow-up appointment with Dr. Jena Gauss scheduled for 11/08/20 at [facility].   Kash Davie A. Ladonna Sn[facility] [phone] (office) [email or web address]

## 2020-10-28 NOTE — Progress Notes (Signed)
Inpatient Rehabilitation Care Coordinator Discharge Note   Patient Details  Name: Heather [name] MRN: [id number] Date of Birth: 15-Sep-1945   Discharge location: Home  Length of Stay: 10 Days  Discharge activity level: ambulatory level Supervision  Home/community participation: Pt daughter able to provide assistance in the home 24/7  Patient response VO:HYWVPX Literacy - How often do you need to have someone help you when you read instructions, pamphlets, or other written material from your doctor or pharmacy?: Rarely  Patient response TG:GYIRSW Isolation - How often do you feel lonely or isolated from those around you?: Never  Services provided included: MD, RD, PT, OT, SLP, RN, CM, TR, Pharmacy, Neuropsych, SW  Financial Services:  Field seismologist Utilized: Private Insurance [employer]  Choices offered to/list presented to: pt  Follow-up services arranged:  Outpatient    Outpatient Servicies: [facility]      Patient response to transportation need: Is the patient able to respond to transportation needs?: Yes In the past 12 months, has lack of transportation kept you from medical appointments or from getting medications?: No In the past 12 months, has lack of transportation kept you from meetings, work, or from getting things needed for daily living?: No    Comments (or additional information):  Patient/Family verbalized understanding of follow-up arrangements:  Yes  Individual responsible for coordination of the follow-up plan: pt, [phone]  Confirmed correct DME delivered: Andria Rhein 10/28/2020    Andria Rhein
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
## 2020-10-31 ENCOUNTER — Other Ambulatory Visit: Payer: Self-pay | Admitting: Surgery

## 2020-10-31 DIAGNOSIS — K810 Acute cholecystitis: Secondary | ICD-10-CM

## 2020-11-03 ENCOUNTER — Ambulatory Visit: Payer: Medicare HMO | Attending: Physician Assistant | Admitting: Physical Therapy

## 2020-11-03 ENCOUNTER — Telehealth: Payer: Self-pay

## 2020-11-03 DIAGNOSIS — M25662 Stiffness of left knee, not elsewhere classified: Secondary | ICD-10-CM | POA: Insufficient documentation

## 2020-11-03 DIAGNOSIS — G8929 Other chronic pain: Secondary | ICD-10-CM | POA: Insufficient documentation

## 2020-11-03 DIAGNOSIS — R262 Difficulty in walking, not elsewhere classified: Secondary | ICD-10-CM | POA: Insufficient documentation

## 2020-11-03 DIAGNOSIS — M25562 Pain in left knee: Secondary | ICD-10-CM | POA: Insufficient documentation

## 2020-11-03 DIAGNOSIS — M6281 Muscle weakness (generalized): Secondary | ICD-10-CM | POA: Insufficient documentation

## 2020-11-03 NOTE — Telephone Encounter (Signed)
NOTES ON FILE FROM [facility] [phone] SENT REFERRAL TO SCHEDULING

## 2020-11-03 NOTE — Telephone Encounter (Signed)
NOTES ON FILE FROM [facility] [phone] SENT REFERRAL TO SCHEDULING

## 2020-11-09 ENCOUNTER — Ambulatory Visit: Payer: Medicare HMO

## 2020-11-09 ENCOUNTER — Other Ambulatory Visit: Payer: Self-pay

## 2020-11-09 DIAGNOSIS — R262 Difficulty in walking, not elsewhere classified: Secondary | ICD-10-CM

## 2020-11-09 DIAGNOSIS — M6281 Muscle weakness (generalized): Secondary | ICD-10-CM

## 2020-11-09 DIAGNOSIS — M25662 Stiffness of left knee, not elsewhere classified: Secondary | ICD-10-CM | POA: Diagnosis present

## 2020-11-09 DIAGNOSIS — M25562 Pain in left knee: Secondary | ICD-10-CM | POA: Diagnosis present

## 2020-11-09 DIAGNOSIS — G8929 Other chronic pain: Secondary | ICD-10-CM | POA: Diagnosis present

## 2020-11-09 NOTE — Therapy (Addendum)
[place], [place], [address] Phone: [phone]   Fax:  [phone]  Physical Therapy Evaluation/ Discharge Summary  Patient Details  Name: Heather [name] MRN: [id number] Date of Birth: 1945/07/26 Referring Provider (PT): Cathlyn Parsons, PA-C   Encounter Date: 11/09/2020   PT End of Session - 11/09/20 1511     Visit Number 1    Number of Visits 9    Date for PT Re-Evaluation 01/04/21    Authorization Type [employer] MCR    Authorization Time Period FOTO v6, V10, KX mod at v15    Progress Note Due on Visit 10    PT Start Time 1315    PT Stop Time 1400    PT Time Calculation (min) 45 min    Activity Tolerance Patient tolerated treatment well    Behavior During Therapy [facility] for tasks assessed/performed             Past Medical History:  Diagnosis Date   Anxiety    Closed right ankle fracture    Complication of anesthesia    Depression    Headache    Hyperlipidemia    Hypertension    IBS (irritable bowel syndrome)    PONV (postoperative nausea and vomiting)    Seasonal allergies     Past Surgical History:  Procedure Laterality Date   APPENDECTOMY     IR PERC CHOLECYSTOSTOMY  10/12/2020   JOINT REPLACEMENT Bilateral    TKA   LEFT HEART CATH AND CORONARY ANGIOGRAPHY N/A 10/11/2020   Procedure: LEFT HEART CATH AND CORONARY ANGIOGRAPHY;  Surgeon: Martinique, Peter M, MD;  Location: [place] [facility];  Service: Cardiovascular;  Laterality: N/A;   LUMBAR LAMINECTOMY/DECOMPRESSION MICRODISCECTOMY N/A 05/15/2020   Procedure: LUMBAR LAMINECTOMY/DECOMPRESSION OF Lumbar three;  Surgeon: Erline Levine, MD;  Location: [place];  Service: Neurosurgery;  Laterality: N/A;   LUMBAR PERCUTANEOUS PEDICLE SCREW 2 LEVEL N/A 05/15/2020   Procedure: LUMBAR PERCUTANEOUS SCREW, Lumbar one - Lumbar two, Lumbar four - Lumbar five;  Surgeon: Erline Levine, MD;  Location: [place];  Service: Neurosurgery;  Laterality:  N/A;   ORIF ANKLE FRACTURE Right 06/05/2018   Procedure: OPEN REDUCTION INTERNAL FIXATION (ORIF) ANKLE FRACTURE;  Surgeon: Renette Butters, MD;  Location: [place];  Service: Orthopedics;  Laterality: Right;   ORIF FEMUR FRACTURE Left 10/13/2020   Procedure: OPEN REDUCTION INTERNAL FIXATION (ORIF) DISTAL FEMUR FRACTURE;  Surgeon: Shona Needles, MD;  Location: [place];  Service: Orthopedics;  Laterality: Left;    There were no vitals filed for this visit.    Subjective Assessment - 11/09/20 1320     Subjective Pt reports following a bicondylar L femur fx on 10/09/2020 and a subsequent ORIF L distal femur on 10/13/2020. She is accompanied by her daughter, Heather [name] today, who provides most of the subjective information today. She reports that the pt suffered 2 falls on the same day on 8/21, the second of which, the pt's R leg was positioned behind her and to the side with the patient laying on her back. She was unable to move her leg, and she was immediately transported to the ED via EMS. She was told she had severe hypotension, leading to a syncopal episode. Her BP meds were updated while in the hospital and her BP has been WNL the past fews weeks. While in the hospital, it was also discovered that she had gall stones, but because the leg injury took precedence at the  time, the gallbladder surgery has been postponed, leading to the pt arriving with a foley catheter and collection bag. She also arrives in a manual wheelchair. The daughter also reports that since the fall, the pt demonstrates high fear avoidance with ambulation, particularly with stairs. Pt denies any N/T or referred pain related to her current problem. Cx red flag screening negative.    Pertinent History lumbar laminectomy/ decompression, R TSA, BIL TKA, Heart catherization    Limitations Walking;Standing;House hold activities;Sitting    How long can you sit comfortably? 15-30 minutes    How long can you stand  comfortably? 50% weight-bearing per surgeon's precautions, 5 minutes    How long can you walk comfortably? 50% weight-bearing per surgeon's precautions, 5 minutes    Patient Stated Goals cooking, household chores without pain    Currently in Pain? Yes    Pain Score 6     Pain Location Leg    Pain Orientation Upper;Left    Pain Descriptors / Indicators Aching;Throbbing    Pain Type Acute pain    Pain Onset More than a month ago    Pain Frequency Intermittent    Aggravating Factors  Standing, walking, prolonged sitting    Pain Relieving Factors Ice, pain medication    Effect of Pain on Daily Activities Unable to stand/ walk for long periods of time    Multiple Pain Sites No                [facility] PT Assessment - 11/09/20 0001       Assessment   Medical Diagnosis Unspecified fracture of lower end of unspecified femur, sequela (S72.409S), Displaced fracture of lateral condyle of left femur, subsequent encounter for closed fracture with routine healing (S72.422D), Displaced fracture of medial condyle of left femur, subsequent encounter for closed fracture with routine healing (C58.527P)    Referring Provider (PT) Cathlyn Parsons, PA-C    Onset Date/Surgical Date 10/13/20    Hand Dominance Right    Next MD Visit 12/26/2020    Prior Therapy Yes, for lumbar spine      Precautions   Precautions None      Restrictions   Weight Bearing Restrictions Yes    LLE Weight Bearing Partial weight bearing    LLE Partial Weight Bearing Percentage or Pounds 50%      Balance Screen   Has the patient fallen in the past 6 months Yes    How many times? 6    Has the patient had a decrease in activity level because of a fear of falling?  Yes    Is the patient reluctant to leave their home because of a fear of falling?  Yes      [place] Private residence    Living Arrangements Children    Available Help at Discharge Family    Type of [place] to enter    Entrance Stairs-Number of Steps 3    [place] One level    Home Equipment Wheelchair - manual;Walker - 2 wheels      Prior Function   Level of Independence Needs assistance with ADLs;Independent with transfers;Independent with homemaking with ambulation;Independent with community mobility with device    Vocation Retired    Leisure cooking      Cognition   Overall Cognitive Status Within Functional Limits for tasks assessed      Observation/Other Assessments   Observations  Pt arrives in [facility] with foley catheter/ collection recepticle in place    Focus on Therapeutic Outcomes (FOTO)  13%, Projected 45% in 18 visits      AROM   Right Knee Extension 0    Right Knee Flexion 120    Left Knee Extension 8   pain   Left Knee Flexion 100   pain     Strength   Right Hip Flexion 4+/5    Left Hip Flexion 4/5   pain   Right Knee Flexion 5/5    Right Knee Extension 5/5    Left Knee Flexion 5/5    Left Knee Extension 4+/5   pain     Transfers   Stand Pivot Transfers 7: Independent    Number of Reps Other reps (comment)   4   Transfer Cueing VC for slow descent                        Objective measurements completed on examination: See above findings.                PT Education - 11/09/20 1344     Education Details Discussed objective findings, POC, and proper form with HEP.    Person(s) Educated Patient;Child(ren)    Methods Explanation;Demonstration;Handout    Comprehension Verbalized understanding;Returned demonstration              PT Short Term Goals - 11/09/20 1521       PT SHORT TERM GOAL #1   Title Pt will report understanding and adherence to her HEP in order to promote independence in the management of her sxs.    Baseline HEP provided at eval.    Time 4    Period Weeks    Status New    Target Date 12/07/20               PT Long Term Goals - 11/09/20 1522       PT LONG  TERM GOAL #1   Title Pt will achieve a FOTO score of 45% in order to demonstrate improved functional ability as it relates to her leg sxs.    Baseline 13%    Time 8    Period Weeks    Status New    Target Date 01/04/21      PT LONG TERM GOAL #2   Title Pt will achieve L knee flexion and extension AROM within 5 degrees of R in order to promote WNL gait mechanics.    Baseline See flowsheet    Time 8    Period Weeks    Status New    Target Date 01/04/21      PT LONG TERM GOAL #3   Title Pt will achieve L knee extension strength of 5/5 with 0-2/10 pain in order to perform independent transfers without limitation.    Baseline 4+/5 with pain    Time 8    Period Weeks    Status New    Target Date 01/04/21      PT LONG TERM GOAL #4   Title Pt will report ability to walk for 30 minutes Mod-I with a FWW with 0-2/10 pain in order to grocery shop without limitation.    Baseline 5 minutes with 50% PWB and FWW    Time 8    Period Weeks    Status New    Target Date 01/04/21  Plan - 11/09/20 1512     Clinical Impression Statement Pt is a pleasant 75yo F presenting s/p ORIF L distal femur on 8/25 following a bicondylar L femur fx on 8/21. The pt is currently 50% PWB per her orthopedic surgeon's orders. Upon assessment, her primary impairments include limited and painful L knee flexion/ extension AROM and limited and painful L knee extension MMT. Pt was able to perform safe, independent stand pivot transfers with VC's for slow descent. Unable to assess pt's surgical site today due to pt wearing leggings. The pt will benefit from skilled PT to address her primary impairments with a focus on functional gait, balance, and stair training. Due to financial constraints, the pt requests to be seen once per week as opposed to twice per week.    Personal Factors and Comorbidities Age;Comorbidity 3+    Comorbidities See medical hx    Examination-Activity Limitations  Bathing;Locomotion Level;Transfers;Bed Mobility;Bend;Sit;Carry;Squat;Stairs;Dressing;Stand;Lift;Hygiene/Grooming    Examination-Participation Restrictions Cleaning;Meal Prep;Community Activity;Shop;Laundry    Stability/Clinical Decision Making Evolving/Moderate complexity    Clinical Decision Making Moderate    Rehab Potential Good    PT Frequency 1x / week    PT Duration 8 weeks    PT Treatment/Interventions ADLs/Self Care Home Management;Aquatic Therapy;Cryotherapy;Electrical Stimulation;DME Instruction;Moist Heat;Iontophoresis 4mg /ml Dexamethasone;Gait training;Stair training;Functional mobility training;Therapeutic activities;Therapeutic exercise;Balance training;Neuromuscular re-education;Manual techniques;Passive range of motion;Scar mobilization;Patient/family education;Taping;Manual lymph drainage;Vasopneumatic Device    PT Next Visit Plan Assess standing balance while abiding by weight-bearing restrictions, progress LE strengthening/ balance exercises    PT Home Exercise Plan [id number]    Consulted and Agree with Plan of Care Patient             Patient will benefit from skilled therapeutic intervention in order to improve the following deficits and impairments:  Abnormal gait, Decreased range of motion, Difficulty walking, Decreased activity tolerance, Pain, Increased edema, Decreased strength, Decreased mobility, Decreased balance, Hypomobility, Impaired flexibility  Visit Diagnosis: Chronic pain of left knee  Stiffness of left knee, not elsewhere classified  Muscle weakness (generalized)  Difficulty in walking, not elsewhere classified     Problem List Patient Active Problem List   Diagnosis Date Noted   Diarrhea 10/28/2020   NSTEMI (non-ST elevated myocardial infarction) ([place]) 10/28/2020   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   Closed bicondylar fracture of distal femur, left, with routine healing, subsequent encounter 10/19/2020   Closed fracture of distal end of femur,  unspecified fracture morphology, sequela 10/18/2020   Malnutrition of moderate degree 10/12/2020   Hypotension 10/09/2020   Femur fracture (HCC) 10/09/2020   Prolonged QT interval 10/09/2020   Abnormal ECG 10/09/2020   AKI (acute kidney injury) ([place]) 10/09/2020   Hyperkalemia 10/09/2020   Elevated troponin 10/09/2020   Leucocytosis 10/09/2020   Burst fracture of lumbar vertebra ([place]) 05/14/2020   Chronic cough 12/29/2019   Essential hypertension 12/29/2019   Pneumonia due to COVID-19 virus 12/29/2019    Vanessa [place], PT, DPT 11/09/20 3:27 PM   [facility] [address] [place], [place], [address] Phone: [phone]   Fax:  [phone]  Name: Heather [name] MRN: [id number] Date of Birth: Jan 08, 1946  Referring diagnosis? Unspecified fracture of lower end of unspecified femur, sequela [S72.409S], Displaced fracture of lateral condyle of left femur, subsequent encounter for closed fracture with routine healing [S72.422D], Displaced fracture of medial condyle of left femur, subsequent encounter for closed fracture with routine healing [S72.432D] Treatment diagnosis? (if different than referring diagnosis) M25.562 Chronic pain of L knee What was this (referring dx)  caused by? _0  Surgery _1  Fall _2  Ongoing issue _3  Arthritis _4  Other: ____________  Laterality: _5  Rt _6  Lt _7  Both  Check all possible CPT codes:      _8  97110 (Therapeutic Exercise)  _9  92507 (SLP Treatment)  _10  24001 (Neuro Re-ed)   _11  80970 (Swallowing Treatment)   _12  44925 (Gait Training)   _13  24159 (Cognitive Training, 1st 15 minutes) _14  97140 (Manual Therapy)   _15  97130 (Cognitive Training, each add'l 15 minutes)  _16  97530 (Therapeutic Activities)  _17  Other, List CPT Code ____________    _18  01724 (Self Care)       _19  All codes above (97110 - 97535)  _20  97012 (Mechanical Traction)  _21  97014 (E-stim Unattended)  _22  97032 (E-stim  manual)  _23  97033 (Ionto)  _24  97035 (Ultrasound)  _25  97760 (Orthotic Fit) _26  97750 (Physical Performance Training) _27  [id number] (Aquatic Therapy) _28  19542 (Contrast Bath) _29  48144 (Paraffin) _30  97597 (Wound Care 1st 20 sq cm) _31  97598 (Wound Care each add'l 20 sq cm) _32  97016 (Vasopneumatic Device) _33  39265 (Orthotic Training) _34  [phone] (Prosthetic Training)  Vanessa [place], PT, DPT 11/11/20 9:38 AM  PHYSICAL THERAPY DISCHARGE SUMMARY  Visits from Start of Care: 1  Current functional level related to goals / functional outcomes: Unable to assess   Remaining deficits: Unable to assess   Education / Equipment: HEP   Patient agrees to discharge. Patient goals were not met. Patient is being discharged due to not returning since the last visit.  Vanessa [place], PT, DPT 12/05/20 9:46 AM

## 2020-11-09 NOTE — Patient Instructions (Signed)
  [id number]

## 2020-11-10 ENCOUNTER — Other Ambulatory Visit: Payer: Self-pay

## 2020-11-10 ENCOUNTER — Telehealth: Payer: Self-pay | Admitting: Pharmacist

## 2020-11-10 NOTE — Telephone Encounter (Signed)
Pharmacy Transitions of Care Follow-up Telephone Call  Date of discharge: 10/28/20  Discharge Diagnosis: Closed bicondylar fracture of distal femur, left, with routine healing, subsequent encounter  Medication changes made at discharge:  START taking: apixaban (ELIQUIS) diphenoxylate-atropine (LOMOTIL) hydrocortisone (ProctoCare-HC) multivitamin with minerals oxyCODONE (Oxy IR/ROXICODONE) CHANGE how you take: HYDROcodone-acetaminophen (NORCO/VICODIN) traZODone (DESYREL) STOP taking: cephALEXin 500 MG capsule (KEFLEX) methocarbamol 500 MG tablet (ROBAXIN) metroNIDAZOLE 500 MG tablet (FLAGYL) montelukast 10 MG tablet (SINGULAIR) omeprazole 40 MG capsule (PRILOSEC) ondansetron 4 MG disintegrating tablet (ZOFRAN-ODT) tacrolimus 0.03 % ointment (PROTOPIC)  Medication Accessibility:  Home Pharmacy: [id number] - [place], [place] - [address]  [address], [place] [address]    Was the patient provided with refills on discharged medications? No   Pt has [employer] ins.  Medication Review:  APIXABAN (ELIQUIS)  Apixaban 2.5 mg BID initiated on 10/28/20. - Discussed importance of taking medication around the same time everyday  - Reviewed potential DDIs with patient  - Advised patient of medications to avoid (NSAIDs, ASA)  - Educated that Tylenol (acetaminophen) will be the preferred analgesic to prevent risk of bleeding  - Emphasized importance of monitoring for signs and symptoms of bleeding (abnormal bruising, prolonged bleeding, nose bleeds, bleeding from gums, discolored urine, black tarry stools)  - Advised patient to alert all providers of anticoagulation therapy prior to starting a new medication or having a procedure   Follow-up Appointments:  Go to Haddix, Gillie Manners, MD (Orthopedic Surgery) on 11/08/2020; on 11/08/20 at 2:00PM for wound check and repeat x-rays Follow up with Janece Canterbury, MD (Family Medicine) Call Quintella Reichert, MD  (Cardiology); for follow up on heart Follow up with [facility] (Gastroenterology) Follow up with Suttle, Thressa Sheller, MD (Interventional Radiology); Schedulers will call you with date and time of follow-up appoinment.  Please call [phone] with any questions. Call Cornett, Maisie Fus, MD (General Surgery); for appt  If their condition worsens, is the pt aware to call PCP or go to the Emergency Dept.? Yes  Final Patient Assessment: Pt is doing well on medications and has multiple appts lines up the week of 11/21/20. Her daughter is helping her keep track of her medications.

## 2020-11-12 ENCOUNTER — Other Ambulatory Visit: Payer: Self-pay

## 2020-11-12 ENCOUNTER — Observation Stay (HOSPITAL_COMMUNITY)
Admission: EM | Admit: 2020-11-12 | Discharge: 2020-11-15 | Disposition: A | Discharge status: Home / Self Care | Payer: Medicare HMO | Attending: Internal Medicine | Admitting: Internal Medicine

## 2020-11-12 ENCOUNTER — Encounter (HOSPITAL_COMMUNITY): Payer: Self-pay

## 2020-11-12 DIAGNOSIS — K8012 Calculus of gallbladder with acute and chronic cholecystitis without obstruction: Secondary | ICD-10-CM | POA: Diagnosis not present

## 2020-11-12 DIAGNOSIS — I5032 Chronic diastolic (congestive) heart failure: Secondary | ICD-10-CM | POA: Diagnosis not present

## 2020-11-12 DIAGNOSIS — Z7901 Long term (current) use of anticoagulants: Secondary | ICD-10-CM | POA: Insufficient documentation

## 2020-11-12 DIAGNOSIS — Z79899 Other long term (current) drug therapy: Secondary | ICD-10-CM | POA: Diagnosis not present

## 2020-11-12 DIAGNOSIS — Z96653 Presence of artificial knee joint, bilateral: Secondary | ICD-10-CM | POA: Diagnosis not present

## 2020-11-12 DIAGNOSIS — Y732 Prosthetic and other implants, materials and accessory gastroenterology and urology devices associated with adverse incidents: Secondary | ICD-10-CM | POA: Insufficient documentation

## 2020-11-12 DIAGNOSIS — I110 Hypertensive heart disease with heart failure: Secondary | ICD-10-CM | POA: Insufficient documentation

## 2020-11-12 DIAGNOSIS — Z20822 Contact with and (suspected) exposure to covid-19: Secondary | ICD-10-CM | POA: Diagnosis not present

## 2020-11-12 DIAGNOSIS — L0390 Cellulitis, unspecified: Secondary | ICD-10-CM

## 2020-11-12 DIAGNOSIS — T8140XA Infection following a procedure, unspecified, initial encounter: Secondary | ICD-10-CM | POA: Diagnosis not present

## 2020-11-12 DIAGNOSIS — T85598A Other mechanical complication of other gastrointestinal prosthetic devices, implants and grafts, initial encounter: Secondary | ICD-10-CM | POA: Insufficient documentation

## 2020-11-12 DIAGNOSIS — L089 Local infection of the skin and subcutaneous tissue, unspecified: Secondary | ICD-10-CM

## 2020-11-12 DIAGNOSIS — Z87891 Personal history of nicotine dependence: Secondary | ICD-10-CM | POA: Diagnosis not present

## 2020-11-12 DIAGNOSIS — Z7982 Long term (current) use of aspirin: Secondary | ICD-10-CM | POA: Diagnosis not present

## 2020-11-12 DIAGNOSIS — L03818 Cellulitis of other sites: Secondary | ICD-10-CM

## 2020-11-12 DIAGNOSIS — T8149XA Infection following a procedure, other surgical site, initial encounter: Secondary | ICD-10-CM | POA: Diagnosis present

## 2020-11-12 DIAGNOSIS — I10 Essential (primary) hypertension: Secondary | ICD-10-CM | POA: Diagnosis present

## 2020-11-12 DIAGNOSIS — K810 Acute cholecystitis: Secondary | ICD-10-CM | POA: Diagnosis present

## 2020-11-12 LAB — COMPREHENSIVE METABOLIC PANEL
ALT: 20 U/L (ref 0–44)
AST: 23 U/L (ref 15–41)
Albumin: 3.1 g/dL — ABNORMAL LOW (ref 3.5–5.0)
Alkaline Phosphatase: 125 U/L (ref 38–126)
Anion gap: 8 (ref 5–15)
BUN: 9 mg/dL (ref 8–23)
CO2: 24 mmol/L (ref 22–32)
Calcium: 8.6 mg/dL — ABNORMAL LOW (ref 8.9–10.3)
Chloride: 108 mmol/L (ref 98–111)
Creatinine, Ser: 0.77 mg/dL (ref 0.44–1.00)
GFR, Estimated: >60 mL/min (ref >60)
Glucose, Bld: 115 mg/dL — ABNORMAL HIGH (ref 70–99)
Potassium: 4.4 mmol/L (ref 3.5–5.1)
Sodium: 140 mmol/L (ref 135–145)
Total Bilirubin: 0.5 mg/dL (ref 0.3–1.2)
Total Protein: 6.3 g/dL — ABNORMAL LOW (ref 6.5–8.1)

## 2020-11-12 LAB — CBC WITH DIFFERENTIAL/PLATELET
Abs Immature Granulocytes: 0.04 10*3/uL (ref 0.00–0.07)
Basophils Absolute: 0 10*3/uL (ref 0.0–0.1)
Basophils Relative: 0 %
Eosinophils Absolute: 0.2 10*3/uL (ref 0.0–0.5)
Eosinophils Relative: 2 %
HCT: 40.3 % (ref 36.0–46.0)
Hemoglobin: 12.4 g/dL (ref 12.0–15.0)
Immature Granulocytes: 0 %
Lymphocytes Relative: 29 %
Lymphs Abs: 2.7 10*3/uL (ref 0.7–4.0)
MCH: 27.1 pg (ref 26.0–34.0)
MCHC: 30.8 g/dL (ref 30.0–36.0)
MCV: 88.2 fL (ref 80.0–100.0)
Monocytes Absolute: 0.6 10*3/uL (ref 0.1–1.0)
Monocytes Relative: 6 %
Neutro Abs: 6 10*3/uL (ref 1.7–7.7)
Neutrophils Relative %: 63 %
Platelets: 278 10*3/uL (ref 150–400)
RBC: 4.57 MIL/uL (ref 3.87–5.11)
RDW: 17.3 % — ABNORMAL HIGH (ref 11.5–15.5)
WBC: 9.5 10*3/uL (ref 4.0–10.5)
nRBC: 0 % (ref 0.0–0.2)

## 2020-11-12 NOTE — ED Triage Notes (Addendum)
Patient from home d/t drainage bag with foul drainage. Patient had a procedure that she was unsure of the kind. Patient states the fluid from bag stinks. Pt also endorsing RLQ to the site. Pt states the drainage has been purulent for about 2 days and that drainage site unable to be irrigated.   Daughter with patient stating that patient needed drainage bag for pending procedure on 25th of October. Pt family states that spoke with a triage RN over phone who informed them to come to ED due to appearance of drainage to rule out infection.

## 2020-11-13 ENCOUNTER — Emergency Department (HOSPITAL_COMMUNITY): Payer: Medicare HMO

## 2020-11-13 DIAGNOSIS — Z01810 Encounter for preprocedural cardiovascular examination: Secondary | ICD-10-CM

## 2020-11-13 DIAGNOSIS — T8149XA Infection following a procedure, other surgical site, initial encounter: Secondary | ICD-10-CM | POA: Diagnosis present

## 2020-11-13 DIAGNOSIS — L03818 Cellulitis of other sites: Secondary | ICD-10-CM

## 2020-11-13 DIAGNOSIS — I10 Essential (primary) hypertension: Secondary | ICD-10-CM

## 2020-11-13 DIAGNOSIS — T148XXA Other injury of unspecified body region, initial encounter: Secondary | ICD-10-CM

## 2020-11-13 DIAGNOSIS — L089 Local infection of the skin and subcutaneous tissue, unspecified: Secondary | ICD-10-CM

## 2020-11-13 DIAGNOSIS — I5032 Chronic diastolic (congestive) heart failure: Secondary | ICD-10-CM | POA: Diagnosis not present

## 2020-11-13 DIAGNOSIS — K810 Acute cholecystitis: Secondary | ICD-10-CM | POA: Diagnosis present

## 2020-11-13 LAB — BASIC METABOLIC PANEL
Anion gap: 5 (ref 5–15)
BUN: 7 mg/dL — ABNORMAL LOW (ref 8–23)
CO2: 24 mmol/L (ref 22–32)
Calcium: 8.2 mg/dL — ABNORMAL LOW (ref 8.9–10.3)
Chloride: 109 mmol/L (ref 98–111)
Creatinine, Ser: 0.77 mg/dL (ref 0.44–1.00)
GFR, Estimated: >60 mL/min (ref >60)
Glucose, Bld: 108 mg/dL — ABNORMAL HIGH (ref 70–99)
Potassium: 4.1 mmol/L (ref 3.5–5.1)
Sodium: 138 mmol/L (ref 135–145)

## 2020-11-13 LAB — RESP PANEL BY RT-PCR (FLU A&B, COVID) ARPGX2
Influenza A by PCR: NEGATIVE
Influenza B by PCR: NEGATIVE
SARS Coronavirus 2 by RT PCR: NEGATIVE

## 2020-11-13 LAB — LIPASE, BLOOD: Lipase: 37 U/L (ref 11–51)

## 2020-11-13 MED ORDER — IOHEXOL 300 MG/ML  SOLN
100.0000 mL | Freq: Once | INTRAMUSCULAR | Status: AC | PRN
  Administered 2020-11-13: 100 mL via INTRAVENOUS

## 2020-11-13 MED ORDER — DICYCLOMINE HCL 20 MG PO TABS
20.0000 mg | ORAL_TABLET | Freq: Four times a day (QID) | ORAL | Status: DC | PRN
  Filled 2020-11-13: qty 1

## 2020-11-13 MED ORDER — SODIUM CHLORIDE 0.45 % IV SOLN: INTRAVENOUS | Status: DC

## 2020-11-13 MED ORDER — VANCOMYCIN HCL IN DEXTROSE 1-5 GM/200ML-% IV SOLN
1000.0000 mg | Freq: Once | INTRAVENOUS | Status: AC
  Administered 2020-11-13: 1000 mg via INTRAVENOUS
  Filled 2020-11-13: qty 200

## 2020-11-13 MED ORDER — ROSUVASTATIN CALCIUM 5 MG PO TABS
10.0000 mg | ORAL_TABLET | Freq: Every day | ORAL | Status: DC
  Administered 2020-11-13 – 2020-11-15 (×2): 10 mg via ORAL
  Filled 2020-11-13 (×2): qty 2

## 2020-11-13 MED ORDER — GABAPENTIN 300 MG PO CAPS: 300.0000 mg | ORAL_CAPSULE | Freq: Two times a day (BID) | ORAL | Status: DC

## 2020-11-13 MED ORDER — SPIRONOLACTONE 12.5 MG HALF TABLET
12.5000 mg | ORAL_TABLET | Freq: Every day | ORAL | Status: DC
  Administered 2020-11-13: 12.5 mg via ORAL
  Filled 2020-11-13: qty 1

## 2020-11-13 MED ORDER — GABAPENTIN 300 MG PO CAPS
300.0000 mg | ORAL_CAPSULE | Freq: Every day | ORAL | Status: DC
  Administered 2020-11-13 – 2020-11-15 (×2): 300 mg via ORAL
  Filled 2020-11-13 (×2): qty 1

## 2020-11-13 MED ORDER — ALBUTEROL SULFATE HFA 108 (90 BASE) MCG/ACT IN AERS
2.0000 | INHALATION_SPRAY | Freq: Four times a day (QID) | RESPIRATORY_TRACT | Status: DC | PRN
  Filled 2020-11-13 (×2): qty 6.7

## 2020-11-13 MED ORDER — SACUBITRIL-VALSARTAN 24-26 MG PO TABS: 1.0000 | ORAL_TABLET | Freq: Two times a day (BID) | ORAL | Status: DC

## 2020-11-13 MED ORDER — DIPHENOXYLATE-ATROPINE 2.5-0.025 MG PO TABS: 2.0000 | ORAL_TABLET | Freq: Four times a day (QID) | ORAL | Status: DC | PRN

## 2020-11-13 MED ORDER — GABAPENTIN 300 MG PO CAPS
600.0000 mg | ORAL_CAPSULE | Freq: Every day | ORAL | Status: DC
  Administered 2020-11-13 – 2020-11-14 (×2): 600 mg via ORAL
  Filled 2020-11-13 (×2): qty 2

## 2020-11-13 MED ORDER — TRAZODONE HCL 50 MG PO TABS
25.0000 mg | ORAL_TABLET | Freq: Every evening | ORAL | Status: DC | PRN
  Administered 2020-11-13 – 2020-11-14 (×2): 25 mg via ORAL
  Filled 2020-11-13 (×2): qty 1

## 2020-11-13 MED ORDER — METOPROLOL SUCCINATE ER 25 MG PO TB24
50.0000 mg | ORAL_TABLET | Freq: Every day | ORAL | Status: DC
  Administered 2020-11-13 – 2020-11-15 (×3): 50 mg via ORAL
  Filled 2020-11-13 (×3): qty 2

## 2020-11-13 MED ORDER — SACUBITRIL-VALSARTAN 24-26 MG PO TABS
1.0000 | ORAL_TABLET | Freq: Two times a day (BID) | ORAL | Status: DC
  Administered 2020-11-13 (×2): 1 via ORAL
  Filled 2020-11-13 (×2): qty 1

## 2020-11-13 MED ORDER — HYDROCODONE-ACETAMINOPHEN 5-325 MG PO TABS
1.0000 | ORAL_TABLET | Freq: Four times a day (QID) | ORAL | Status: DC | PRN
  Administered 2020-11-13 – 2020-11-14 (×4): 1 via ORAL
  Administered 2020-11-15 (×2): 2 via ORAL
  Administered 2020-11-15: 1 via ORAL
  Administered 2020-11-15: 2 via ORAL
  Filled 2020-11-13: qty 2
  Filled 2020-11-13 (×2): qty 1
  Filled 2020-11-13 (×2): qty 2
  Filled 2020-11-13: qty 1
  Filled 2020-11-13: qty 2
  Filled 2020-11-13: qty 1

## 2020-11-13 MED ORDER — METRONIDAZOLE 500 MG/100ML IV SOLN
500.0000 mg | Freq: Two times a day (BID) | INTRAVENOUS | Status: DC
  Administered 2020-11-13 – 2020-11-15 (×6): 500 mg via INTRAVENOUS
  Filled 2020-11-13 (×6): qty 100

## 2020-11-13 MED ORDER — BUPROPION HCL ER (XL) 150 MG PO TB24
300.0000 mg | ORAL_TABLET | Freq: Every day | ORAL | Status: DC
  Administered 2020-11-13 – 2020-11-15 (×2): 300 mg via ORAL
  Filled 2020-11-13: qty 2
  Filled 2020-11-13 (×2): qty 1

## 2020-11-13 MED ORDER — ASPIRIN EC 81 MG PO TBEC
81.0000 mg | DELAYED_RELEASE_TABLET | Freq: Every day | ORAL | Status: DC
  Administered 2020-11-13 – 2020-11-15 (×2): 81 mg via ORAL
  Filled 2020-11-13 (×2): qty 1

## 2020-11-13 MED ORDER — SODIUM CHLORIDE 0.9 % IV SOLN
2.0000 g | INTRAVENOUS | Status: DC
  Administered 2020-11-13 – 2020-11-15 (×3): 2 g via INTRAVENOUS
  Filled 2020-11-13 (×4): qty 20

## 2020-11-13 MED ORDER — FENTANYL CITRATE PF 50 MCG/ML IJ SOSY
50.0000 ug | PREFILLED_SYRINGE | Freq: Once | INTRAMUSCULAR | Status: AC
  Administered 2020-11-13: 50 ug via INTRAVENOUS
  Filled 2020-11-13: qty 1

## 2020-11-13 MED ORDER — SPIRONOLACTONE 12.5 MG HALF TABLET
12.5000 mg | ORAL_TABLET | Freq: Every day | ORAL | Status: DC
  Filled 2020-11-13: qty 1

## 2020-11-13 MED ORDER — PIPERACILLIN-TAZOBACTAM 3.375 G IVPB 30 MIN: 3.3750 g | Freq: Three times a day (TID) | INTRAVENOUS | Status: DC

## 2020-11-13 MED ORDER — OXYCODONE HCL 5 MG PO TABS: 5.0000 mg | ORAL_TABLET | Freq: Two times a day (BID) | ORAL | Status: DC | PRN

## 2020-11-13 MED ORDER — HEPARIN SODIUM (PORCINE) 5000 UNIT/ML IJ SOLN
5000.0000 [IU] | Freq: Three times a day (TID) | INTRAMUSCULAR | Status: DC
  Administered 2020-11-13 – 2020-11-15 (×7): 5000 [IU] via SUBCUTANEOUS
  Filled 2020-11-13 (×7): qty 1

## 2020-11-13 NOTE — Progress Notes (Signed)
PROGRESS NOTE    Heather [name]  MWN:[id number] DOB: 12/26/1945 DOA: 11/12/2020 PCP: Janece Canterbury, MD   Brief Narrative: 75 year old with past medical history significant for L3 burst fracture  s/p repair March 2022, distal femoral fracture with ORIF Eliquis 2022 June she was diagnosed with cholecystitis.  She was not a surgical candidate at that time and a cystostomy catheter to gallbladder was placed.  She received antibiotics for sepsis.  She presents with 2-day history of redness around the catheter site along with purulent drainage.  Evaluation in the ED CT abdomen was negative for abscess.  General surgery was consulted, plan is for Sx on Monday.    Assessment & Plan:   Active Problems:   Essential hypertension   Acute cholecystitis   Wound infection following procedure   Chronic diastolic CHF (congestive heart failure) (HCC)   Wound infection after surgery   1-History of cholecystitis status post cholecystostomy tube placement August 2022.  Wound infection around catheter site with drainage purulent secretion: Patient was a started on ceftriaxone and Flagyl continue -Surgery consulted, planning cholecystectomy on Monday.  They are asking for cardiology preop clearance -Cussed with surgery and allow diet today, n.p.o. after midnight  2-Chronic diastolic dysfunction grade 2: Continue with Entresto and spironolactone.  Normal saline lock fluids.  Patient will be on diet.  3-Hypertension: Continue with metoprolol, entresto.    Estimated body mass index is 26.56 kg/m as calculated from the following:   Height as of this encounter: 5' 3.5" (1.613 m).   Weight as of 10/28/20: 69.1 kg.   DVT prophylaxis: Heparin  Code Status: Full code Family Communication: Daughter who was at bedside Disposition Plan:  Status is: Observation  The patient remains OBS appropriate and will d/c before 2 midnights.  Dispo: The patient is from: Home              Anticipated d/c is  to: Home              Patient currently is not medically stable to d/c.   Difficult to place patient No        Consultants:  General sx  Procedures:   Antimicrobials:  Ceftriaxone , flagyl.   Subjective: She denies worsening abdominal pain. Report drainage from around catheter, and redness around catheter   Objective: Vitals:   11/13/20 0230 11/13/20 0315 11/13/20 0352 11/13/20 0500  BP: 114/60 (!) 106/58 131/64 126/78  Pulse: 76 81 81 86  Resp: 16  19 14   Temp:      TempSrc:      SpO2: 95% 98% 96% 96%  Height:        Intake/Output Summary (Last 24 hours) at 11/13/2020 0753 Last data filed at 11/13/2020 11/15/2020 Gross per 24 hour  Intake 300 ml  Output --  Net 300 ml   There were no vitals filed for this visit.  Examination:  General exam: Appears calm and comfortable  Respiratory system: Clear to auscultation. Respiratory effort normal. Cardiovascular system: S1 & S2 heard, RRR. No JVD, murmurs, rubs, gallops or clicks. No pedal edema. Gastrointestinal system: Abdomen is nondistended, soft and nontender. No organomegaly or masses felt. Normal bowel sounds heard. Cholecystostomy tube in place. Mild redness Central nervous system: Alert and oriented.  Extremities: Symmetric 5 x 5 power.   Data Reviewed: I have personally reviewed following labs and imaging studies  CBC: Recent Labs  Lab 11/12/20 1934  WBC 9.5  NEUTROABS 6.0  HGB 12.4  HCT 40.3  MCV 88.2  PLT 278   Basic Metabolic Panel: Recent Labs  Lab 11/12/20 1934  NA 140  K 4.4  CL 108  CO2 24  GLUCOSE 115*  BUN 9  CREATININE 0.77  CALCIUM 8.6*   GFR: CrCl cannot be calculated (Unknown ideal weight.). Liver Function Tests: Recent Labs  Lab 11/12/20 1934  AST 23  ALT 20  ALKPHOS 125  BILITOT 0.5  PROT 6.3*  ALBUMIN 3.1*   Recent Labs  Lab 11/12/20 1934  LIPASE 37   No results for input(s): AMMONIA in the last 168 hours. Coagulation Profile: No results for input(s): INR,  PROTIME in the last 168 hours. Cardiac Enzymes: No results for input(s): CKTOTAL, CKMB, CKMBINDEX, TROPONINI in the last 168 hours. BNP (last 3 results) No results for input(s): PROBNP in the last 8760 hours. HbA1C: No results for input(s): HGBA1C in the last 72 hours. CBG: No results for input(s): GLUCAP in the last 168 hours. Lipid Profile: No results for input(s): CHOL, HDL, LDLCALC, TRIG, CHOLHDL, LDLDIRECT in the last 72 hours. Thyroid Function Tests: No results for input(s): TSH, T4TOTAL, FREET4, T3FREE, THYROIDAB in the last 72 hours. Anemia Panel: No results for input(s): VITAMINB12, FOLATE, FERRITIN, TIBC, IRON, RETICCTPCT in the last 72 hours. Sepsis Labs: No results for input(s): PROCALCITON, LATICACIDVEN in the last 168 hours.  Recent Results (from the past 240 hour(s))  Resp Panel by RT-PCR (Flu A&B, Covid) Nasopharyngeal Swab     Status: None   Collection Time: 11/13/20 12:59 AM   Specimen: Nasopharyngeal Swab; Nasopharyngeal(NP) swabs in vial transport medium  Result Value Ref Range Status   SARS Coronavirus 2 by RT PCR NEGATIVE NEGATIVE Final    Comment: (NOTE) SARS-CoV-2 target nucleic acids are NOT DETECTED.  The SARS-CoV-2 RNA is generally detectable in upper respiratory specimens during the acute phase of infection. The lowest concentration of SARS-CoV-2 viral copies this assay can detect is 138 copies/mL. A negative result does not preclude SARS-Cov-2 infection and should not be used as the sole basis for treatment or other patient management decisions. A negative result may occur with  improper specimen collection/handling, submission of specimen other than nasopharyngeal swab, presence of viral mutation(s) within the areas targeted by this assay, and inadequate number of viral copies(<138 copies/mL). A negative result must be combined with clinical observations, patient history, and epidemiological information. The expected result is Negative.  Fact  Sheet for Patients:  BloggerCourse.com  Fact Sheet for Healthcare Providers:  SeriousBroker.it  This test is no t yet approved or cleared by the [place] FDA and  has been authorized for detection and/or diagnosis of SARS-CoV-2 by FDA under an Emergency Use Authorization (EUA). This EUA will remain  in effect (meaning this test can be used) for the duration of the COVID-19 declaration under Section 564(b)(1) of the Act, 21 U.S.C.section 360bbb-3(b)(1), unless the authorization is terminated  or revoked sooner.       Influenza A by PCR NEGATIVE NEGATIVE Final   Influenza B by PCR NEGATIVE NEGATIVE Final    Comment: (NOTE) The Xpert Xpress SARS-CoV-2/FLU/RSV plus assay is intended as an aid in the diagnosis of influenza from Nasopharyngeal swab specimens and should not be used as a sole basis for treatment. Nasal washings and aspirates are unacceptable for Xpert Xpress SARS-CoV-2/FLU/RSV testing.  Fact Sheet for Patients: BloggerCourse.com  Fact Sheet for Healthcare Providers: SeriousBroker.it  This test is not yet approved or cleared by the [place] FDA and has been authorized for detection and/or diagnosis of SARS-CoV-2  by FDA under an Emergency Use Authorization (EUA). This EUA will remain in effect (meaning this test can be used) for the duration of the COVID-19 declaration under Section 564(b)(1) of the Act, 21 U.S.C. section 360bbb-3(b)(1), unless the authorization is terminated or revoked.  Performed at [facility], [address] [place], [place] [address]          Radiology Studies: CT ABDOMEN PELVIS W CONTRAST  Result Date: 11/13/2020 CLINICAL DATA:  Acute nonlocalized abdominal pain. Infected cholecystostomy catheter. Right lower quadrant abdominal pain. EXAM: CT ABDOMEN AND PELVIS WITH CONTRAST TECHNIQUE: Multidetector CT imaging of the  abdomen and pelvis was performed using the standard protocol following bolus administration of intravenous contrast. CONTRAST:  OMNIPAQUE IOHEXOL 300 MG/ML  SOLN COMPARISON:  10/26/2020 FINDINGS: Lower chest: Visualized lung bases are clear. Mild coronary artery calcification. Global cardiac size within normal limits. Hepatobiliary: Percutaneous drainage catheter is seen looped within the gallbladder fundus, similar to that noted on prior examination. Minimal pericatheter inflammatory change adjacent to the gallbladder fossa. No discrete perihepatic or subcutaneous fluid collection identified. The liver is unremarkable. No intra or extrahepatic biliary ductal dilation. Pancreas: Unremarkable Spleen: Unremarkable Adrenals/Urinary Tract: The adrenal glands are unremarkable. The kidneys are normal in size and position. Stable 10 mm exophytic low-attenuation lesion arises from the interpolar region of the right kidney likely representing a small cortical cyst. The kidneys are otherwise unremarkable. The bladder is unremarkable. Stomach/Bowel: Stomach, small bowel, and large bowel are unremarkable. Appendix absent. No free intraperitoneal gas or fluid. Vascular/Lymphatic: Mild aortoiliac atherosclerotic calcification. No aortic aneurysm. No pathologic adenopathy within the abdomen and pelvis. Reproductive: Uterus and bilateral adnexa are unremarkable. Other: No abdominal wall hernia.  Rectum unremarkable. Musculoskeletal: 6 non rib bearing segments of the lumbar spine. Stable compression deformity with retropulsion of L4 with approximately 50% loss of height. Spinal fusion with instrumentation and resection of the spinous process of L4 has been performed of L2-L6. No acute bone abnormality. No lytic or blastic bone lesion. IMPRESSION: Percutaneous cholecystostomy catheter in appropriate position. Trace pericholecystic inflammatory change along the tract of the catheter. However, no discrete pericatheter,  perihepatic, or subcutaneous fluid collection identified. Aortic Atherosclerosis (ICD10-I70.0). Electronically Signed   By: Helyn Numbers M.D.   On: 11/13/2020 03:54        Scheduled Meds:  aspirin EC  81 mg Oral Daily   buPROPion  300 mg Oral Daily   gabapentin  300 mg Oral Daily   gabapentin  600 mg Oral QHS   heparin  5,000 Units Subcutaneous Q8H   metoprolol succinate  50 mg Oral Daily   rosuvastatin  10 mg Oral Daily   Continuous Infusions:  sodium chloride 50 mL/hr at 11/13/20 0624   cefTRIAXone (ROCEPHIN)  IV Stopped (11/13/20 0707)   metronidazole 500 mg (11/13/20 0645)     LOS: 0 days    Time spent: 35 minutes.     Alba Cory, MD [facility]   If 7PM-7AM, please contact night-coverage [email or web address]  11/13/2020, 7:53 AM

## 2020-11-13 NOTE — Consult Note (Signed)
CARDIOLOGY CONSULT NOTE       Patient ID: Heather [name] MRN: [id number] DOB/AGE: 74-Apr-1947 74 y.o.  Admit date: 11/12/2020 Referring Physician: Bedelia Person Primary Physician: Janece Canterbury, MD Primary Cardiologist: Mayford Knife Reason for Consultation: Preoperative  Active Problems:   Essential hypertension   Acute cholecystitis   Wound infection following procedure   Chronic diastolic CHF (congestive heart failure) (HCC)   Wound infection after surgery   HPI:  75 y.o. admitted with infected GB drain For lap chole 11/14/20 History of HTN, HLD IBS, depression. Had left femur fracture in August and found to have cholecystitis and tube placed. During initial fall and femur fracture had elevated troponin 555 Echo showed EF 50-55% with no significant valve disease 10/10/20 CAth done by Dr Swaziland showed no obstructive CAD with tightest lesion being only 30% proximal LAD She is readmitted with erythema along tube and inability for daughter to flush. No chest pain dyspnea palpitations or syncope WBC elevated 13.1 Hct 40.3   ROS All other systems reviewed and negative except as noted above  Past Medical History:  Diagnosis Date   Anxiety    Closed right ankle fracture    Complication of anesthesia    Depression    Headache    Hyperlipidemia    Hypertension    IBS (irritable bowel syndrome)    PONV (postoperative nausea and vomiting)    Seasonal allergies     Family History  Problem Relation Age of Onset   Hypertension Other     Social History   Socioeconomic History   Marital status: Single    Spouse name: Not on file   Number of children: Not on file   Years of education: Not on file   Highest education level: Not on file  Occupational History   Not on file  Tobacco Use   Smoking status: Former   Smokeless tobacco: Never  Substance and Sexual Activity   Alcohol use: Not Currently    Comment: quit 22 yrs ago   Drug use: Never   Sexual activity: Not on file  Other  Topics Concern   Not on file  Social History Narrative   ** Merged History Encounter **       Social Determinants of Health   Financial Resource Strain: Not on file  Food Insecurity: Not on file  Transportation Needs: Not on file  Physical Activity: Not on file  Stress: Not on file  Social Connections: Not on file  Intimate Partner Violence: Not on file    Past Surgical History:  Procedure Laterality Date   APPENDECTOMY     IR PERC CHOLECYSTOSTOMY  10/12/2020   JOINT REPLACEMENT Bilateral    TKA   LEFT HEART CATH AND CORONARY ANGIOGRAPHY N/A 10/11/2020   Procedure: LEFT HEART CATH AND CORONARY ANGIOGRAPHY;  Surgeon: Swaziland, Sanoe Hazan M, MD;  Location: [facility];  Service: Cardiovascular;  Laterality: N/A;   LUMBAR LAMINECTOMY/DECOMPRESSION MICRODISCECTOMY N/A 05/15/2020   Procedure: LUMBAR LAMINECTOMY/DECOMPRESSION OF Lumbar three;  Surgeon: Maeola Harman, MD;  Location: [facility] OR;  Service: Neurosurgery;  Laterality: N/A;   LUMBAR PERCUTANEOUS PEDICLE SCREW 2 LEVEL N/A 05/15/2020   Procedure: LUMBAR PERCUTANEOUS SCREW, Lumbar one - Lumbar two, Lumbar four - Lumbar five;  Surgeon: Maeola Harman, MD;  Location: [facility] OR;  Service: Neurosurgery;  Laterality: N/A;   ORIF ANKLE FRACTURE Right 06/05/2018   Procedure: OPEN REDUCTION INTERNAL FIXATION (ORIF) ANKLE FRACTURE;  Surgeon: Sheral Apley, MD;  Location: [facility];  Service:  Orthopedics;  Laterality: Right;   ORIF FEMUR FRACTURE Left 10/13/2020   Procedure: OPEN REDUCTION INTERNAL FIXATION (ORIF) DISTAL FEMUR FRACTURE;  Surgeon: Roby Lofts, MD;  Location: [facility] OR;  Service: Orthopedics;  Laterality: Left;      Current Facility-Administered Medications:    albuterol (VENTOLIN HFA) 108 (90 Base) MCG/ACT inhaler 2 puff, 2 puff, Inhalation, Q6H PRN, Norins, Rosalyn Gess, MD   aspirin EC tablet 81 mg, 81 mg, Oral, Daily, Norins, Rosalyn Gess, MD   buPROPion (WELLBUTRIN XL) 24 hr tablet 300 mg, 300 mg, Oral, Daily, Norins,  Rosalyn Gess, MD   cefTRIAXone (ROCEPHIN) 2 g in sodium chloride 0.9 % 100 mL IVPB, 2 g, Intravenous, Q24H, Norins, Rosalyn Gess, MD, Stopped at 11/13/20 1093   dicyclomine (BENTYL) tablet 20 mg, 20 mg, Oral, Q6H PRN, Norins, Rosalyn Gess, MD   diphenoxylate-atropine (LOMOTIL) 2.5-0.025 MG per tablet 2 tablet, 2 tablet, Oral, QID PRN, Norins, Rosalyn Gess, MD   gabapentin (NEURONTIN) capsule 300 mg, 300 mg, Oral, Daily, Zadie Rhine, MD   gabapentin (NEURONTIN) capsule 600 mg, 600 mg, Oral, QHS, Zadie Rhine, MD   heparin injection 5,000 Units, 5,000 Units, Subcutaneous, Q8H, Norins, Rosalyn Gess, MD, 5,000 Units at 11/13/20 2355   HYDROcodone-acetaminophen (NORCO/VICODIN) 5-325 MG per tablet 1-2 tablet, 1-2 tablet, Oral, Q6H PRN, Norins, Rosalyn Gess, MD, 1 tablet at 11/13/20 7322   metoprolol succinate (TOPROL-XL) 24 hr tablet 50 mg, 50 mg, Oral, Daily, Norins, Rosalyn Gess, MD   metroNIDAZOLE (FLAGYL) IVPB 500 mg, 500 mg, Intravenous, Q12H, Norins, Rosalyn Gess, MD, Stopped at 11/13/20 0745   rosuvastatin (CRESTOR) tablet 10 mg, 10 mg, Oral, Daily, Norins, Rosalyn Gess, MD   sacubitril-valsartan (ENTRESTO) 24-26 mg per tablet, 1 tablet, Oral, BID, Regalado, Belkys A, MD   spironolactone (ALDACTONE) tablet 12.5 mg, 12.5 mg, Oral, Daily, Regalado, Belkys A, MD   traZODone (DESYREL) tablet 25 mg, 25 mg, Oral, QHS PRN, Norins, Rosalyn Gess, MD  Current Outpatient Medications:    albuterol (VENTOLIN HFA) 108 (90 Base) MCG/ACT inhaler, Inhale 2 puffs into the lungs every 6 (six) hours as needed for wheezing or shortness of breath., Disp: , Rfl:    apixaban (ELIQUIS) 2.5 MG TABS tablet, Take 1 tablet (2.5 mg total) by mouth 2 (two) times daily., Disp: 30 tablet, Rfl: 0   aspirin EC 81 MG EC tablet, Take 1 tablet (81 mg total) by mouth daily. Swallow whole., Disp: 30 tablet, Rfl: 1   buPROPion (WELLBUTRIN XL) 300 MG 24 hr tablet, Take 300 mg by mouth daily., Disp: , Rfl:    dicyclomine (BENTYL) 20 MG tablet, Take 20 mg by  mouth every 6 (six) hours as needed for spasms., Disp: , Rfl:    diphenoxylate-atropine (LOMOTIL) 2.5-0.025 MG tablet, Take 2 tablets by mouth 4 (four) times daily as needed for diarrhea or loose stools., Disp: 30 tablet, Rfl: 0   fluticasone (FLONASE) 50 MCG/ACT nasal spray, Place 1 spray into both nostrils daily., Disp: , Rfl:    gabapentin (NEURONTIN) 300 MG capsule, Take 300-600 mg by mouth 2 (two) times daily. Take 1 capsule (300 mg) in the morning & Take 2 capsules (600 mg) at bedtime, Disp: , Rfl:    HYDROcodone-acetaminophen (NORCO/VICODIN) 5-325 MG tablet, Take 1-2 tablets by mouth every 6 (six) hours as needed for severe pain ((score 7 to 10))., Disp: 52 tablet, Rfl: 0   hydrocortisone (PROCTOCARE-HC) 2.5 % rectal cream, Place 1 application rectally 4 (four) times daily., Disp: 30 g, Rfl: 2  metoprolol succinate (TOPROL-XL) 50 MG 24 hr tablet, Take 1 tablet (50 mg total) by mouth daily. Take with or immediately following a meal., Disp: 30 tablet, Rfl: 1   Multiple Vitamin (MULTIVITAMIN WITH MINERALS) TABS tablet, Take 1 tablet by mouth daily., Disp: , Rfl:    oxyCODONE (OXY IR/ROXICODONE) 5 MG immediate release tablet, Take 1 tablet (5 mg total) by mouth 2 (two) times daily as needed for severe pain (pain score 7-10)., Disp: 7 tablet, Rfl: 0   rosuvastatin (CRESTOR) 10 MG tablet, Take 10 mg by mouth daily., Disp: , Rfl:    sacubitril-valsartan (ENTRESTO) 24-26 MG, Take 1 tablet by mouth 2 (two) times daily., Disp: 60 tablet, Rfl: 0   spironolactone (ALDACTONE) 25 MG tablet, Take one-half tablet (12.5 mg total) by mouth daily., Disp: 15 tablet, Rfl: 0   traZODone (DESYREL) 50 MG tablet, Take one-half tablet (25 mg total) by mouth at bedtime as needed for sleep., Disp: 30 tablet, Rfl: 0  aspirin EC  81 mg Oral Daily   buPROPion  300 mg Oral Daily   gabapentin  300 mg Oral Daily   gabapentin  600 mg Oral QHS   heparin  5,000 Units Subcutaneous Q8H   metoprolol succinate  50 mg Oral Daily    rosuvastatin  10 mg Oral Daily   sacubitril-valsartan  1 tablet Oral BID   spironolactone  12.5 mg Oral Daily    cefTRIAXone (ROCEPHIN)  IV Stopped (11/13/20 0707)   metronidazole Stopped (11/13/20 0745)    Physical Exam: Blood pressure 126/78, pulse 86, temperature 98.8 F (37.1 C), temperature source Oral, resp. rate 14, height 5' 3.5" (1.613 m), SpO2 96 %.    Elderly black female Lungs clear No murmur  GB tube RUQ with erythema abdomen not tender or distended No edema Palpable PT bilaterally   Labs:   Lab Results  Component Value Date   WBC 9.5 11/12/2020   HGB 12.4 11/12/2020   HCT 40.3 11/12/2020   MCV 88.2 11/12/2020   PLT 278 11/12/2020    Recent Labs  Lab 11/12/20 1934 11/13/20 0732  NA 140 138  K 4.4 4.1  CL 108 109  CO2 24 24  BUN 9 7*  CREATININE 0.77 0.77  CALCIUM 8.6* 8.2*  PROT 6.3*  --   BILITOT 0.5  --   ALKPHOS 125  --   ALT 20  --   AST 23  --   GLUCOSE 115* 108*   Lab Results  Component Value Date   CKTOTAL 75 10/10/2020   No results found for: CHOL No results found for: HDL No results found for: LDLCALC No results found for: TRIG No results found for: CHOLHDL No results found for: LDLDIRECT    Radiology: DG Abd 1 View  Result Date: 10/20/2020 CLINICAL DATA:  Ileus EXAM: ABDOMEN - 1 VIEW COMPARISON:  10/18/2020 FINDINGS: Nonobstructive pattern of bowel gas, with gas present to the rectum. No free air in the abdomen. Pigtail catheter projects in the vicinity of the right kidney. Posterior lumbar fusion. IMPRESSION: Nonobstructive pattern of bowel gas, with gas present to the rectum. No free air in the abdomen. Electronically Signed   By: Lauralyn Primes M.D.   On: 10/20/2020 10:39   DG Abd 1 View  Result Date: 10/18/2020 CLINICAL DATA:  Abdomen pain EXAM: ABDOMEN - 1 VIEW COMPARISON:  None. FINDINGS: Mild increased small and large bowel gas without obstructive pattern. Cholecystostomy tube in the right upper quadrant. Surgical hardware in  lumbar spine  IMPRESSION: Mild air-filled small and large bowel suggestive mild ileus. Electronically Signed   By: Jasmine Pang M.D.   On: 10/18/2020 22:29   CT ABDOMEN PELVIS W CONTRAST  Result Date: 11/13/2020 CLINICAL DATA:  Acute nonlocalized abdominal pain. Infected cholecystostomy catheter. Right lower quadrant abdominal pain. EXAM: CT ABDOMEN AND PELVIS WITH CONTRAST TECHNIQUE: Multidetector CT imaging of the abdomen and pelvis was performed using the standard protocol following bolus administration of intravenous contrast. CONTRAST:  OMNIPAQUE IOHEXOL 300 MG/ML  SOLN COMPARISON:  10/26/2020 FINDINGS: Lower chest: Visualized lung bases are clear. Mild coronary artery calcification. Global cardiac size within normal limits. Hepatobiliary: Percutaneous drainage catheter is seen looped within the gallbladder fundus, similar to that noted on prior examination. Minimal pericatheter inflammatory change adjacent to the gallbladder fossa. No discrete perihepatic or subcutaneous fluid collection identified. The liver is unremarkable. No intra or extrahepatic biliary ductal dilation. Pancreas: Unremarkable Spleen: Unremarkable Adrenals/Urinary Tract: The adrenal glands are unremarkable. The kidneys are normal in size and position. Stable 10 mm exophytic low-attenuation lesion arises from the interpolar region of the right kidney likely representing a small cortical cyst. The kidneys are otherwise unremarkable. The bladder is unremarkable. Stomach/Bowel: Stomach, small bowel, and large bowel are unremarkable. Appendix absent. No free intraperitoneal gas or fluid. Vascular/Lymphatic: Mild aortoiliac atherosclerotic calcification. No aortic aneurysm. No pathologic adenopathy within the abdomen and pelvis. Reproductive: Uterus and bilateral adnexa are unremarkable. Other: No abdominal wall hernia.  Rectum unremarkable. Musculoskeletal: 6 non rib bearing segments of the lumbar spine. Stable compression deformity  with retropulsion of L4 with approximately 50% loss of height. Spinal fusion with instrumentation and resection of the spinous process of L4 has been performed of L2-L6. No acute bone abnormality. No lytic or blastic bone lesion. IMPRESSION: Percutaneous cholecystostomy catheter in appropriate position. Trace pericholecystic inflammatory change along the tract of the catheter. However, no discrete pericatheter, perihepatic, or subcutaneous fluid collection identified. Aortic Atherosclerosis (ICD10-I70.0). Electronically Signed   By: Helyn Numbers M.D.   On: 11/13/2020 03:54   CT ABDOMEN PELVIS W CONTRAST  Result Date: 10/26/2020 CLINICAL DATA:  Cholecystitis, status post percutaneous cholecystostomy, increasing WBC EXAM: CT ABDOMEN AND PELVIS WITH CONTRAST TECHNIQUE: Multidetector CT imaging of the abdomen and pelvis was performed using the standard protocol following bolus administration of intravenous contrast. CONTRAST:  32mL OMNIPAQUE IOHEXOL 350 MG/ML SOLN, additional oral enteric contrast COMPARISON:  Abdominal ultrasound, 10/12/2020, ultrasound-guided cholecystostomy placement, 10/12/2020 FINDINGS: Lower chest: No acute abnormality. Left coronary artery calcifications. Hepatobiliary: No solid liver abnormality is seen. Percutaneous cholecystostomy tube is position with formed pigtail in the gallbladder fundus (series 3, image 34). Mild gallbladder wall thickening and small volume adjacent pericholecystic fluid. The common bile duct is mildly dilated to the ampulla, maximum caliber 0.9 cm centrally. No obvious obstructing calculus or other lesion. Pancreas: Unremarkable. No pancreatic ductal dilatation or surrounding inflammatory changes. Spleen: Normal in size without significant abnormality. Adrenals/Urinary Tract: Adrenal glands are unremarkable. Kidneys are normal, without renal calculi, solid lesion, or hydronephrosis. Bladder is unremarkable. Stomach/Bowel: Stomach is within normal limits. Appendix  is not clearly visualized and may be surgically absent. No evidence of bowel wall thickening, distention, or inflammatory changes. Occasional sigmoid diverticula. Vascular/Lymphatic: Aortic atherosclerosis. No enlarged abdominal or pelvic lymph nodes. Reproductive: No mass or other significant abnormality. Other: No abdominal wall hernia or abnormality. No abdominopelvic ascites. Musculoskeletal: Bridging fusion of L1 through L5 about a sclerotic burst type fracture of the L3 vertebral body (series 7, image 56). IMPRESSION: 1. Percutaneous cholecystostomy  tube is positioned with formed pigtail in the gallbladder fundus. Mild gallbladder wall thickening and small volume adjacent pericholecystic fluid, findings consistent with acute cholecystitis. 2. The common bile duct is mildly dilated to the ampulla, maximum caliber 0.9 cm centrally. No obvious obstructing calculus or other lesion. Consider ERCP/MRCP to further evaluate for centrally obstructing etiology, particularly given that gallstones were reported on prior ultrasound but radiopaque on current CT examination. 3. Bridging fusion of L1 through L5 about a sclerotic burst type fracture of the L3 vertebral body. 4. Coronary artery disease. Aortic Atherosclerosis (ICD10-I70.0). Electronically Signed   By: Lauralyn Primes M.D.   On: 10/26/2020 14:03   DG FEMUR MIN 2 VIEWS LEFT  Result Date: 10/27/2020 CLINICAL DATA:  Fracture EXAM: LEFT FEMUR 2 VIEWS COMPARISON:  10/13/2020 FINDINGS: Redemonstrated, comminuted fractures of the distal left femoral metadiaphysis, status post plate and screw fixation of the lateral distal left femur and total knee arthroplasty. No change in fracture fragment alignment. Radiographic findings are not significantly changed compared to prior examination dated 10/13/2020. Soft tissues are unremarkable. IMPRESSION: Redemonstrated, comminuted fractures of the distal left femoral metadiaphysis, status post plate and screw fixation of the  lateral distal left femur and total knee arthroplasty. No change in fracture fragment alignment. Electronically Signed   By: Lauralyn Primes M.D.   On: 10/27/2020 10:25   VAS [place] LOWER EXTREMITY VENOUS (DVT)  Result Date: 10/19/2020  Lower Venous DVT Study Patient Name:  Heather [name]  Date of Exam:   10/19/2020 Medical Rec #: [id number]               Accession #:    [id number] Date of Birth: [birth year]              Patient Gender: F Patient Age:   45 years Exam Location:  [facility] Procedure:      VAS [place] LOWER EXTREMITY VENOUS (DVT) Referring Phys: PAMELA LOVE --------------------------------------------------------------------------------  Indications: Immobility.  Risk Factors: Surgery. Limitations: Poor ultrasound/tissue interface and patient positioning. Comparison Study: No prior studies. Performing Technologist: Chanda Busing RVT  Examination Guidelines: A complete evaluation includes B-mode imaging, spectral Doppler, color Doppler, and power Doppler as needed of all accessible portions of each vessel. Bilateral testing is considered an integral part of a complete examination. Limited examinations for reoccurring indications may be performed as noted. The reflux portion of the exam is performed with the patient in reverse Trendelenburg.  +---------+---------------+---------+-----------+----------+--------------+ RIGHT    CompressibilityPhasicitySpontaneityPropertiesThrombus Aging +---------+---------------+---------+-----------+----------+--------------+ CFV      Full           Yes      Yes                                 +---------+---------------+---------+-----------+----------+--------------+ SFJ      Full                                                        +---------+---------------+---------+-----------+----------+--------------+ FV Prox  Full                                                         +---------+---------------+---------+-----------+----------+--------------+  FV Mid   Full                                                        +---------+---------------+---------+-----------+----------+--------------+ FV DistalFull                                                        +---------+---------------+---------+-----------+----------+--------------+ PFV      Full                                                        +---------+---------------+---------+-----------+----------+--------------+ POP      Full           Yes      Yes                                 +---------+---------------+---------+-----------+----------+--------------+ PTV      Full                                                        +---------+---------------+---------+-----------+----------+--------------+ PERO     Full                                                        +---------+---------------+---------+-----------+----------+--------------+   +---------+---------------+---------+-----------+----------+-------------------+ LEFT     CompressibilityPhasicitySpontaneityPropertiesThrombus Aging      +---------+---------------+---------+-----------+----------+-------------------+ CFV      Full           Yes      Yes                                      +---------+---------------+---------+-----------+----------+-------------------+ SFJ      Full                                                             +---------+---------------+---------+-----------+----------+-------------------+ FV Prox  Full                                                             +---------+---------------+---------+-----------+----------+-------------------+ FV Mid   Full                                                             +---------+---------------+---------+-----------+----------+-------------------+  FV DistalFull                                                              +---------+---------------+---------+-----------+----------+-------------------+ PFV      Full                                                             +---------+---------------+---------+-----------+----------+-------------------+ POP      Full           Yes      Yes                                      +---------+---------------+---------+-----------+----------+-------------------+ PTV      Full                                                             +---------+---------------+---------+-----------+----------+-------------------+ PERO                                                  Not well visualized +---------+---------------+---------+-----------+----------+-------------------+     Summary: RIGHT: - There is no evidence of deep vein thrombosis in the lower extremity.  - No cystic structure found in the popliteal fossa.  LEFT: - There is no evidence of deep vein thrombosis in the lower extremity. However, portions of this examination were limited- see technologist comments above.  - No cystic structure found in the popliteal fossa.  *See table(s) above for measurements and observations. Electronically signed by Coral Else MD on 10/19/2020 at 6:28:49 PM.    Final     EKG: SR rate 76 LAD nonspecific inferior lateral ST changes 10/14/20   ASSESSMENT AND PLAN:   Preoperative:  Clear to have lap chole in am. Recent echo with low normal EF no valve disease and cath with no obstructive CAD. Will get baseline ECG this admission since she has had labile inferolateral T wave changes ? From inflammation diaphragm from GB disease  GB:  On Rocephin does not look toxic WBC mildly elevated   Signed: Charlton Haws 11/13/2020, 8:50 AM

## 2020-11-13 NOTE — ED Notes (Signed)
[facility] after her drain in right abdomen has been draining pus x 2 days.

## 2020-11-13 NOTE — Consult Note (Addendum)
Reason for Consult/Chief Complaint:dysfunctional cholecystostomy tube Consultant: Bebe Shaggy, MD  Heather [name] is an 75 y.o. female.   HPI: 24F with cholecystostomy tube placed for sepsis 2/2 acute cholecystitis developed during hospitalization 09/2020 for a femur fracture. Reports daughter cares for the catheter and it began draining pus 9/23 without associated fevers, chills, n/v. Last BM 9/24 and was normal. Presented to the ED because daughter was unable to flush catheter at home.   Past Medical History:  Diagnosis Date   Anxiety    Closed right ankle fracture    Complication of anesthesia    Depression    Headache    Hyperlipidemia    Hypertension    IBS (irritable bowel syndrome)    PONV (postoperative nausea and vomiting)    Seasonal allergies     Past Surgical History:  Procedure Laterality Date   APPENDECTOMY     IR PERC CHOLECYSTOSTOMY  10/12/2020   JOINT REPLACEMENT Bilateral    TKA   LEFT HEART CATH AND CORONARY ANGIOGRAPHY N/A 10/11/2020   Procedure: LEFT HEART CATH AND CORONARY ANGIOGRAPHY;  Surgeon: Swaziland, Peter M, MD;  Location: [facility] INVASIVE CV LAB;  Service: Cardiovascular;  Laterality: N/A;   LUMBAR LAMINECTOMY/DECOMPRESSION MICRODISCECTOMY N/A 05/15/2020   Procedure: LUMBAR LAMINECTOMY/DECOMPRESSION OF Lumbar three;  Surgeon: Maeola Harman, MD;  Location: [facility] OR;  Service: Neurosurgery;  Laterality: N/A;   LUMBAR PERCUTANEOUS PEDICLE SCREW 2 LEVEL N/A 05/15/2020   Procedure: LUMBAR PERCUTANEOUS SCREW, Lumbar one - Lumbar two, Lumbar four - Lumbar five;  Surgeon: Maeola Harman, MD;  Location: [facility] OR;  Service: Neurosurgery;  Laterality: N/A;   ORIF ANKLE FRACTURE Right 06/05/2018   Procedure: OPEN REDUCTION INTERNAL FIXATION (ORIF) ANKLE FRACTURE;  Surgeon: Sheral Apley, MD;  Location: [facility];  Service: Orthopedics;  Laterality: Right;   ORIF FEMUR FRACTURE Left 10/13/2020   Procedure: OPEN REDUCTION INTERNAL FIXATION (ORIF) DISTAL  FEMUR FRACTURE;  Surgeon: Roby Lofts, MD;  Location: [facility] OR;  Service: Orthopedics;  Laterality: Left;    Family History  Problem Relation Age of Onset   Hypertension Other     Social History:  reports that she has quit smoking. She has never used smokeless tobacco. She reports that she does not currently use alcohol. She reports that she does not use drugs.  Allergies:  Allergies  Allergen Reactions   Augmentin [Amoxicillin-Pot Clavulanate]     Has tolerated Ancef and Keflex    Medications: I have reviewed the patient's current medications.  Results for orders placed or performed during the hospital encounter of 11/12/20 (from the past 48 hour(s))  CBC with Differential     Status: Abnormal   Collection Time: 11/12/20  7:34 PM  Result Value Ref Range   WBC 9.5 4.0 - 10.5 K/uL   RBC 4.57 3.87 - 5.11 MIL/uL   Hemoglobin 12.4 12.0 - 15.0 g/dL   HCT 69.6 29.5 - 28.4 %   MCV 88.2 80.0 - 100.0 fL   MCH 27.1 26.0 - 34.0 pg   MCHC 30.8 30.0 - 36.0 g/dL   RDW 13.2 (H) 44.0 - 10.2 %   Platelets 278 150 - 400 K/uL   nRBC 0.0 0.0 - 0.2 %   Neutrophils Relative % 63 %   Neutro Abs 6.0 1.7 - 7.7 K/uL   Lymphocytes Relative 29 %   Lymphs Abs 2.7 0.7 - 4.0 K/uL   Monocytes Relative 6 %   Monocytes Absolute 0.6 0.1 - 1.0 K/uL  Eosinophils Relative 2 %   Eosinophils Absolute 0.2 0.0 - 0.5 K/uL   Basophils Relative 0 %   Basophils Absolute 0.0 0.0 - 0.1 K/uL   Immature Granulocytes 0 %   Abs Immature Granulocytes 0.04 0.00 - 0.07 K/uL    Comment: Performed at [facility], [address] [place], [place] [address]  Comprehensive metabolic panel     Status: Abnormal   Collection Time: 11/12/20  7:34 PM  Result Value Ref Range   Sodium 140 135 - 145 mmol/L   Potassium 4.4 3.5 - 5.1 mmol/L   Chloride 108 98 - 111 mmol/L   CO2 24 22 - 32 mmol/L   Glucose, Bld 115 (H) 70 - 99 mg/dL    Comment: Glucose reference range applies only to samples taken after fasting for at least  8 hours.   BUN 9 8 - 23 mg/dL   Creatinine, Ser 5.73 0.44 - 1.00 mg/dL   Calcium 8.6 (L) 8.9 - 10.3 mg/dL   Total Protein 6.3 (L) 6.5 - 8.1 g/dL   Albumin 3.1 (L) 3.5 - 5.0 g/dL   AST 23 15 - 41 U/L   ALT 20 0 - 44 U/L   Alkaline Phosphatase 125 38 - 126 U/L   Total Bilirubin 0.5 0.3 - 1.2 mg/dL   GFR, Estimated >22 >02 mL/min    Comment: (NOTE) Calculated using the CKD-EPI Creatinine Equation (2021)    Anion gap 8 5 - 15    Comment: Performed at [facility], [address] [place], [place] [address]  Lipase, blood     Status: None   Collection Time: 11/12/20  7:34 PM  Result Value Ref Range   Lipase 37 11 - 51 U/L    Comment: Performed at [facility], [address] [place] [address] [place], [place] [address]  Resp Panel by RT-PCR (Flu A&B, Covid) Nasopharyngeal Swab     Status: None   Collection Time: 11/13/20 12:59 AM   Specimen: Nasopharyngeal Swab; Nasopharyngeal(NP) swabs in vial transport medium  Result Value Ref Range   SARS Coronavirus 2 by RT PCR NEGATIVE NEGATIVE    Comment: (NOTE) SARS-CoV-2 target nucleic acids are NOT DETECTED.  The SARS-CoV-2 RNA is generally detectable in upper respiratory specimens during the acute phase of infection. The lowest concentration of SARS-CoV-2 viral copies this assay can detect is 138 copies/mL. A negative result does not preclude SARS-Cov-2 infection and should not be used as the sole basis for treatment or other patient management decisions. A negative result may occur with  improper specimen collection/handling, submission of specimen other than nasopharyngeal swab, presence of viral mutation(s) within the areas targeted by this assay, and inadequate number of viral copies(<138 copies/mL). A negative result must be combined with clinical observations, patient history, and epidemiological information. The expected result is Negative.  Fact Sheet for Patients:  BloggerCourse.com  Fact Sheet for  Healthcare Providers:  SeriousBroker.it  This test is no t yet approved or cleared by the [place] FDA and  has been authorized for detection and/or diagnosis of SARS-CoV-2 by FDA under an Emergency Use Authorization (EUA). This EUA will remain  in effect (meaning this test can be used) for the duration of the COVID-19 declaration under Section 564(b)(1) of the Act, 21 U.S.C.section 360bbb-3(b)(1), unless the authorization is terminated  or revoked sooner.       Influenza A by PCR NEGATIVE NEGATIVE   Influenza B by PCR NEGATIVE NEGATIVE    Comment: (NOTE)  The Xpert Xpress SARS-CoV-2/FLU/RSV plus assay is intended as an aid in the diagnosis of influenza from Nasopharyngeal swab specimens and should not be used as a sole basis for treatment. Nasal washings and aspirates are unacceptable for Xpert Xpress SARS-CoV-2/FLU/RSV testing.  Fact Sheet for Patients: BloggerCourse.com  Fact Sheet for Healthcare Providers: SeriousBroker.it  This test is not yet approved or cleared by the [place] FDA and has been authorized for detection and/or diagnosis of SARS-CoV-2 by FDA under an Emergency Use Authorization (EUA). This EUA will remain in effect (meaning this test can be used) for the duration of the COVID-19 declaration under Section 564(b)(1) of the Act, 21 U.S.C. section 360bbb-3(b)(1), unless the authorization is terminated or revoked.  Performed at [facility], [address], [place], [place] [address]     CT ABDOMEN PELVIS W CONTRAST  Result Date: 11/13/2020 CLINICAL DATA:  Acute nonlocalized abdominal pain. Infected cholecystostomy catheter. Right lower quadrant abdominal pain. EXAM: CT ABDOMEN AND PELVIS WITH CONTRAST TECHNIQUE: Multidetector CT imaging of the abdomen and pelvis was performed using the standard protocol following bolus administration of intravenous contrast. CONTRAST:   OMNIPAQUE IOHEXOL 300 MG/ML  SOLN COMPARISON:  10/26/2020 FINDINGS: Lower chest: Visualized lung bases are clear. Mild coronary artery calcification. Global cardiac size within normal limits. Hepatobiliary: Percutaneous drainage catheter is seen looped within the gallbladder fundus, similar to that noted on prior examination. Minimal pericatheter inflammatory change adjacent to the gallbladder fossa. No discrete perihepatic or subcutaneous fluid collection identified. The liver is unremarkable. No intra or extrahepatic biliary ductal dilation. Pancreas: Unremarkable Spleen: Unremarkable Adrenals/Urinary Tract: The adrenal glands are unremarkable. The kidneys are normal in size and position. Stable 10 mm exophytic low-attenuation lesion arises from the interpolar region of the right kidney likely representing a small cortical cyst. The kidneys are otherwise unremarkable. The bladder is unremarkable. Stomach/Bowel: Stomach, small bowel, and large bowel are unremarkable. Appendix absent. No free intraperitoneal gas or fluid. Vascular/Lymphatic: Mild aortoiliac atherosclerotic calcification. No aortic aneurysm. No pathologic adenopathy within the abdomen and pelvis. Reproductive: Uterus and bilateral adnexa are unremarkable. Other: No abdominal wall hernia.  Rectum unremarkable. Musculoskeletal: 6 non rib bearing segments of the lumbar spine. Stable compression deformity with retropulsion of L4 with approximately 50% loss of height. Spinal fusion with instrumentation and resection of the spinous process of L4 has been performed of L2-L6. No acute bone abnormality. No lytic or blastic bone lesion. IMPRESSION: Percutaneous cholecystostomy catheter in appropriate position. Trace pericholecystic inflammatory change along the tract of the catheter. However, no discrete pericatheter, perihepatic, or subcutaneous fluid collection identified. Aortic Atherosclerosis (ICD10-I70.0). Electronically Signed   By: Helyn Numbers  M.D.   On: 11/13/2020 03:54    ROS 10 point review of systems is negative except as listed above in HPI.   Physical Exam Blood pressure 126/78, pulse 86, temperature 98.8 F (37.1 C), temperature source Oral, resp. rate 14, height 5' 3.5" (1.613 m), SpO2 96 %. Constitutional: well-developed, well-nourished HEENT: pupils equal, round, reactive to light, 54mm b/l, moist conjunctiva, external inspection of ears and nose normal, hearing intact Oropharynx: normal oropharyngeal mucosa, normal dentition Neck: no thyromegaly, trachea midline, no midline cervical tenderness to palpation Chest: breath sounds equal bilaterally, normal respiratory effort, no midline or lateral chest wall tenderness to palpation/deformity Abdomen: soft, NT, cholecystostomy tube in place with infected appearing bile in bag, no bruising, no hepatosplenomegaly GU: normal female genitalia  Back: no wounds, no thoracic/lumbar spine tenderness to palpation, no thoracic/lumbar spine stepoffs Rectal:  deferred Extremities: 2+ radial and pedal pulses bilaterally, intact motor and sensation bilateral UE and LE, no peripheral edema MSK: unable to assess gait/station, no clubbing/cyanosis of fingers/toes, normal ROM of all four extremities Skin: warm, dry, no rashes Psych: normal memory, normal mood/affect    Assessment/Plan: 73F with cholecystostomy tube for cholecystitis 09/2020, now with dysfunctional tube. Plan for lap chole 9/26, but will need pre-op cardiac risk stratification. Avoid therapeutic AC, but DVT ppx okay. Okay for regular diet, NPO at midnight.    Diamantina Monks, MD General and Trauma Surgery [facility]

## 2020-11-13 NOTE — ED Provider Notes (Signed)
[facility] EMERGENCY DEPARTMENT Provider Note   CSN: [id number] Arrival date & time: 11/12/20  1856     History Chief Complaint  Patient presents with   Drainage from Incision    Drainage bag with purulent drainage RLQ.    Wound Infection    Heather [name] is a 75 y.o. female.  The history is provided by the patient and a relative.  Patient presents with possible wound infection that started over 2 days ago.  It is in the right upper quadrant.  She reports tenderness to palpation.  She reports drainage from the wound.  No fevers or vomiting.  Her course is worsening.  Nothing improves her symptoms. No chest pain or shortness of breath. 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   Closed bicondylar fracture of distal femur, left, with routine healing, subsequent encounter 10/19/2020   Closed fracture of distal end of femur, unspecified fracture morphology, sequela 10/18/2020   Malnutrition of moderate degree 10/12/2020   Hypotension 10/09/2020   Femur fracture (HCC) 10/09/2020   Prolonged QT interval 10/09/2020   Abnormal ECG 10/09/2020   AKI (acute kidney injury) (HCC) 10/09/2020   Hyperkalemia 10/09/2020   Elevated troponin 10/09/2020   Leucocytosis 10/09/2020   Burst fracture of lumbar vertebra (HCC) 05/14/2020   Chronic cough 12/29/2019   Essential hypertension 12/29/2019   Pneumonia due to COVID-19 virus 12/29/2019    Past  Surgical History:  Procedure Laterality Date   APPENDECTOMY     IR PERC CHOLECYSTOSTOMY  10/12/2020   JOINT REPLACEMENT Bilateral    TKA   LEFT HEART CATH AND CORONARY ANGIOGRAPHY N/A 10/11/2020   Procedure: LEFT HEART CATH AND CORONARY ANGIOGRAPHY;  Surgeon: Swaziland, Peter M, MD;  Location: [facility] INVASIVE CV LAB;  Service: Cardiovascular;  Laterality: N/A;   LUMBAR LAMINECTOMY/DECOMPRESSION MICRODISCECTOMY N/A 05/15/2020   Procedure: LUMBAR LAMINECTOMY/DECOMPRESSION OF Lumbar three;  Surgeon: Maeola Harman, MD;  Location: [facility] OR;  Service: Neurosurgery;  Laterality: N/A;   LUMBAR PERCUTANEOUS PEDICLE SCREW 2 LEVEL N/A 05/15/2020   Procedure: LUMBAR PERCUTANEOUS SCREW, Lumbar one - Lumbar two, Lumbar four - Lumbar five;  Surgeon: Maeola Harman, MD;  Location: [facility] OR;  Service: Neurosurgery;  Laterality: N/A;   ORIF ANKLE FRACTURE Right 06/05/2018   Procedure: OPEN REDUCTION INTERNAL FIXATION (ORIF) ANKLE FRACTURE;  Surgeon: Sheral Apley, MD;  Location: [facility];  Service: Orthopedics;  Laterality: Right;   ORIF FEMUR FRACTURE Left 10/13/2020   Procedure: OPEN REDUCTION INTERNAL FIXATION (ORIF) DISTAL FEMUR FRACTURE;  Surgeon: Roby Lofts, MD;  Location: [facility] OR;  Service: Orthopedics;  Laterality: Left;     OB History   No obstetric history on file.     Family History  Problem Relation Age of Onset   Hypertension Other     Social History   Tobacco Use   Smoking status: Former   Smokeless tobacco: Never  Substance Use  Topics   Alcohol use: Not Currently    Comment: quit 22 yrs ago   Drug use: Never    Home Medications Prior to Admission medications   Medication Sig Start Date End Date Taking? Authorizing Provider  albuterol (VENTOLIN HFA) 108 (90 Base) MCG/ACT inhaler Inhale 2 puffs into the lungs every 6 (six) hours as needed for wheezing or shortness of breath. 07/12/19   [provider]  apixaban (ELIQUIS) 2.5 MG TABS tablet Take 1 tablet (2.5 mg  total) by mouth 2 (two) times daily. 10/28/20   Love, Evlyn Kanner, PA-C  aspirin EC 81 MG EC tablet Take 1 tablet (81 mg total) by mouth daily. Swallow whole. 10/19/20 11/18/20  Zigmund Daniel., MD  buPROPion (WELLBUTRIN XL) 300 MG 24 hr tablet Take 300 mg by mouth daily.    [provider]  dicyclomine (BENTYL) 20 MG tablet Take 20 mg by mouth every 6 (six) hours as needed for spasms. 02/08/20   [provider]  diphenoxylate-atropine (LOMOTIL) 2.5-0.025 MG tablet Take 2 tablets by mouth 4 (four) times daily as needed for diarrhea or loose stools. 10/28/20   Love, Evlyn Kanner, PA-C  fluticasone (FLONASE) 50 MCG/ACT nasal spray Place 1 spray into both nostrils daily.    [provider]  gabapentin (NEURONTIN) 300 MG capsule Take 300-600 mg by mouth 2 (two) times daily. Take 1 capsule (300 mg) in the morning & Take 2 capsules (600 mg) at bedtime    [provider]  HYDROcodone-acetaminophen (NORCO/VICODIN) 5-325 MG tablet Take 1-2 tablets by mouth every 6 (six) hours as needed for severe pain ((score 7 to 10)). 10/28/20   Love, Evlyn Kanner, PA-C  hydrocortisone (PROCTOCARE-HC) 2.5 % rectal cream Place 1 application rectally 4 (four) times daily. 10/28/20   Love, Evlyn Kanner, PA-C  metoprolol succinate (TOPROL-XL) 50 MG 24 hr tablet Take 1 tablet (50 mg total) by mouth daily. Take with or immediately following a meal. 10/28/20   Love, Evlyn Kanner, PA-C  Multiple Vitamin (MULTIVITAMIN WITH MINERALS) TABS tablet Take 1 tablet by mouth daily. 10/28/20   Love, Evlyn Kanner, PA-C  oxyCODONE (OXY IR/ROXICODONE) 5 MG immediate release tablet Take 1 tablet (5 mg total) by mouth 2 (two) times daily as needed for severe pain (pain score 7-10). 10/28/20   Love, Evlyn Kanner, PA-C  rosuvastatin (CRESTOR) 10 MG tablet Take 10 mg by mouth daily.    [provider]  sacubitril-valsartan (ENTRESTO) 24-26 MG Take 1 tablet by mouth 2 (two) times daily. 10/28/20 11/27/20  Love, Evlyn Kanner, PA-C  spironolactone  (ALDACTONE) 25 MG tablet Take one-half tablet (12.5 mg total) by mouth daily. 10/28/20 11/27/20  Love, Evlyn Kanner, PA-C  traZODone (DESYREL) 50 MG tablet Take one-half tablet (25 mg total) by mouth at bedtime as needed for sleep. 10/28/20   Love, Evlyn Kanner, PA-C    Allergies    Augmentin [amoxicillin-pot clavulanate]  Review of Systems   Review of Systems  Constitutional:  Negative for fever.  Respiratory:  Negative for cough and shortness of breath.   Cardiovascular:  Negative for chest pain.  Gastrointestinal:  Positive for abdominal pain. Negative for vomiting.  Skin:  Positive for wound.  All other systems reviewed and are negative.  Physical Exam Updated Vital Signs BP 131/64   Pulse 81   Temp 98.8 F (37.1 C) (Oral)   Resp 19   Ht 1.613 m (5' 3.5")   SpO2 96%   BMI 26.56 kg/m   Physical Exam  CONSTITUTIONAL: Elderly, no acute distress HEAD: Normocephalic/atraumatic EYES: EOMI ENMT: Mucous membranes moist NECK: supple no meningeal signs CV: S1/S2 noted LUNGS: Lungs are clear to auscultation bilaterally, no apparent distress ABDOMEN: soft, mild tenderness noted to the right upper quadrant.  Cholecystostomy tube in place with small amount of pus drainage.  No crepitus.  See photo below, no rebound or guarding, bowel sounds noted throughout abdomen NEURO: Pt is awake/alert/appropriate, moves all extremitiesx4.  No facial droop.   EXTREMITIES:  full ROM, no obvious deformities noted SKIN: warm, color normal PSYCH: no abnormalities of mood noted, alert and oriented to situation   Patient gave verbal permission to utilize photo for medical documentation only The image was not stored on any personal device ED Results / Procedures / Treatments   Labs (all labs ordered are listed, but only abnormal results are displayed) Labs Reviewed  CBC WITH DIFFERENTIAL/PLATELET - Abnormal; Notable for the following components:      Result Value   RDW 17.3 (*)    All other components within  normal limits  COMPREHENSIVE METABOLIC PANEL - Abnormal; Notable for the following components:   Glucose, Bld 115 (*)    Calcium 8.6 (*)    Total Protein 6.3 (*)    Albumin 3.1 (*)    All other components within normal limits  RESP PANEL BY RT-PCR (FLU A&B, COVID) ARPGX2  LIPASE, BLOOD    EKG None  Radiology CT ABDOMEN PELVIS W CONTRAST  Result Date: 11/13/2020 CLINICAL DATA:  Acute nonlocalized abdominal pain. Infected cholecystostomy catheter. Right lower quadrant abdominal pain. EXAM: CT ABDOMEN AND PELVIS WITH CONTRAST TECHNIQUE: Multidetector CT imaging of the abdomen and pelvis was performed using the standard protocol following bolus administration of intravenous contrast. CONTRAST:  OMNIPAQUE IOHEXOL 300 MG/ML  SOLN COMPARISON:  10/26/2020 FINDINGS: Lower chest: Visualized lung bases are clear. Mild coronary artery calcification. Global cardiac size within normal limits. Hepatobiliary: Percutaneous drainage catheter is seen looped within the gallbladder fundus, similar to that noted on prior examination. Minimal pericatheter inflammatory change adjacent to the gallbladder fossa. No discrete perihepatic or subcutaneous fluid collection identified. The liver is unremarkable. No intra or extrahepatic biliary ductal dilation. Pancreas: Unremarkable Spleen: Unremarkable Adrenals/Urinary Tract: The adrenal glands are unremarkable. The kidneys are normal in size and position. Stable 10 mm exophytic low-attenuation lesion arises from the interpolar region of the right kidney likely representing a small cortical cyst. The kidneys are otherwise unremarkable. The bladder is unremarkable. Stomach/Bowel: Stomach, small bowel, and large bowel are unremarkable. Appendix absent. No free intraperitoneal gas or fluid. Vascular/Lymphatic: Mild aortoiliac atherosclerotic calcification. No aortic aneurysm. No pathologic adenopathy within the abdomen and pelvis. Reproductive: Uterus and bilateral adnexa are  unremarkable. Other: No abdominal wall hernia.  Rectum unremarkable. Musculoskeletal: 6 non rib bearing segments of the lumbar spine. Stable compression deformity with retropulsion of L4 with approximately 50% loss of height. Spinal fusion with instrumentation and resection of the spinous process of L4 has been performed of L2-L6. No acute bone abnormality. No lytic or blastic bone lesion. IMPRESSION: Percutaneous cholecystostomy catheter in appropriate position. Trace pericholecystic inflammatory change along the tract of the catheter. However, no discrete pericatheter, perihepatic, or subcutaneous fluid collection identified. Aortic Atherosclerosis (ICD10-I70.0). Electronically Signed   By: Helyn Numbers M.D.   On: 11/13/2020 03:54    Procedures Procedures   Medications Ordered in ED Medications  vancomycin (VANCOCIN) IVPB 1000 mg/200 mL premix (has no administration in time range)  iohexol (OMNIPAQUE) 300 MG/ML solution 100 mL (100  mLs Intravenous Contrast Given 11/13/20 0341)  fentaNYL (SUBLIMAZE) injection 50 mcg (50 mcg Intravenous Given 11/13/20 0358)    ED Course  I have reviewed the triage vital signs and the nursing notes.  Pertinent labs & imaging results that were available during my care of the patient were reviewed by me and considered in my medical decision making (see chart for details).    MDM Rules/Calculators/A&P                           Patient presents with pain, redness and drainage from her cholecystostomy tube.  Patient was admitted in  August for femur fracture and then found to have cholecystitis. Plan was to have tube removal and likely cholecystectomy next month. Due to findings, will proceed with CT imaging and likely general surgery consult 4:19 AM I have personally reviewed CT findings. Cholecystostomy tube appears in appropriate position However patient still with pain and recent drainage from around the wound Discussed CT findings with Dr. Bedelia Person with  general surgery She recommends admission, IV antibiotics, and cholangiogram by IR. She will consult on the patient. 4:36 AM D/w Dr. Debby Bud for admission to hospitalist Final Clinical Impression(s) / ED Diagnoses Final diagnoses:  Wound infection  Cellulitis of other specified site    Rx / DC Orders ED Discharge Orders     None        Zadie Rhine, MD 11/13/20 [phone]

## 2020-11-13 NOTE — H&P (Addendum)
History and Physical    Heather [name] AOZ:[id number] DOB: [birth year] DOA: 11/12/2020  PCP: Janece Canterbury, MD (Confirm with patient/family/NH records and if not entered, this has to be entered at [facility] point of entry) Patient coming from: home  I have personally briefly reviewed patient's old medical records in [facility]  Chief Complaint: redness and drainage at cholecystostomy cather  HPI: Heather [name] is a 75 y.o. female with medical history significant of L3 burst fracture with ORIF March '22, distal femur fx with ORIF August '22 during which time she was diagnosed with cholecystitis. She was not a surgical candidate and a cystostomy catheter to GB was placed. She did receive abx x for sepsis. Two days prior to admission she noted tenderness at the catheter site along with drainage. No fever or chills reported. With a concern for possible infection she presents to [facility].   ED Course: T 98.8 131/64  HR 81  R 19. Cmet nl, WBC 9.5 with nl Diff. EDP exam remarkable for erythema at catheter site and some drainage. CT - reveals cath in appropriate place with trace inflammatory change along catheter tract w/o fluid collection. Dr. Bedelia Person, general surgery, consult and she recommends [facility] to admit for IV abx and for the patient to have cholangiogram to r/o obstructing stone.   Review of Systems: As per HPI otherwise 10 point review of systems negative.    Past Medical History:  Diagnosis Date   Anxiety    Closed right ankle fracture    Complication of anesthesia    Depression    Headache    Hyperlipidemia    Hypertension    IBS (irritable bowel syndrome)    PONV (postoperative nausea and vomiting)    Seasonal allergies     Past Surgical History:  Procedure Laterality Date   APPENDECTOMY     IR PERC CHOLECYSTOSTOMY  10/12/2020   JOINT REPLACEMENT Bilateral    TKA   LEFT HEART CATH AND CORONARY ANGIOGRAPHY N/A 10/11/2020   Procedure: LEFT HEART CATH AND CORONARY  ANGIOGRAPHY;  Surgeon: Swaziland, Peter M, MD;  Location: [facility] INVASIVE CV LAB;  Service: Cardiovascular;  Laterality: N/A;   LUMBAR LAMINECTOMY/DECOMPRESSION MICRODISCECTOMY N/A 05/15/2020   Procedure: LUMBAR LAMINECTOMY/DECOMPRESSION OF Lumbar three;  Surgeon: Maeola Harman, MD;  Location: [facility] OR;  Service: Neurosurgery;  Laterality: N/A;   LUMBAR PERCUTANEOUS PEDICLE SCREW 2 LEVEL N/A 05/15/2020   Procedure: LUMBAR PERCUTANEOUS SCREW, Lumbar one - Lumbar two, Lumbar four - Lumbar five;  Surgeon: Maeola Harman, MD;  Location: [facility] OR;  Service: Neurosurgery;  Laterality: N/A;   ORIF ANKLE FRACTURE Right 06/05/2018   Procedure: OPEN REDUCTION INTERNAL FIXATION (ORIF) ANKLE FRACTURE;  Surgeon: Sheral Apley, MD;  Location: [facility];  Service: Orthopedics;  Laterality: Right;   ORIF FEMUR FRACTURE Left 10/13/2020   Procedure: OPEN REDUCTION INTERNAL FIXATION (ORIF) DISTAL FEMUR FRACTURE;  Surgeon: Roby Lofts, MD;  Location: [facility];  Service: Orthopedics;  Laterality: Left;    Soc Hx - divorced. She has a daughter with whom she lives and a son. She is retired.    reports that she has quit smoking. She has never used smokeless tobacco. She reports that she does not currently use alcohol. She reports that she does not use drugs.  Allergies  Allergen Reactions   Augmentin [Amoxicillin-Pot Clavulanate]     Has tolerated Ancef and Keflex    Family History  Problem Relation Age of Onset   Hypertension Other  Prior to Admission medications   Medication Sig Start Date End Date Taking? Authorizing Provider  albuterol (VENTOLIN HFA) 108 (90 Base) MCG/ACT inhaler Inhale 2 puffs into the lungs every 6 (six) hours as needed for wheezing or shortness of breath. 07/12/19   [provider]  apixaban (ELIQUIS) 2.5 MG TABS tablet Take 1 tablet (2.5 mg total) by mouth 2 (two) times daily. 10/28/20   Love, Evlyn Kanner, PA-C  aspirin EC 81 MG EC tablet Take 1 tablet (81 mg total) by mouth  daily. Swallow whole. 10/19/20 11/18/20  Zigmund Daniel., MD  buPROPion (WELLBUTRIN XL) 300 MG 24 hr tablet Take 300 mg by mouth daily.    [provider]  dicyclomine (BENTYL) 20 MG tablet Take 20 mg by mouth every 6 (six) hours as needed for spasms. 02/08/20   [provider]  diphenoxylate-atropine (LOMOTIL) 2.5-0.025 MG tablet Take 2 tablets by mouth 4 (four) times daily as needed for diarrhea or loose stools. 10/28/20   Love, Evlyn Kanner, PA-C  fluticasone (FLONASE) 50 MCG/ACT nasal spray Place 1 spray into both nostrils daily.    [provider]  gabapentin (NEURONTIN) 300 MG capsule Take 300-600 mg by mouth 2 (two) times daily. Take 1 capsule (300 mg) in the morning & Take 2 capsules (600 mg) at bedtime    [provider]  HYDROcodone-acetaminophen (NORCO/VICODIN) 5-325 MG tablet Take 1-2 tablets by mouth every 6 (six) hours as needed for severe pain ((score 7 to 10)). 10/28/20   Love, Evlyn Kanner, PA-C  hydrocortisone (PROCTOCARE-HC) 2.5 % rectal cream Place 1 application rectally 4 (four) times daily. 10/28/20   Love, Evlyn Kanner, PA-C  metoprolol succinate (TOPROL-XL) 50 MG 24 hr tablet Take 1 tablet (50 mg total) by mouth daily. Take with or immediately following a meal. 10/28/20   Love, Evlyn Kanner, PA-C  Multiple Vitamin (MULTIVITAMIN WITH MINERALS) TABS tablet Take 1 tablet by mouth daily. 10/28/20   Love, Evlyn Kanner, PA-C  oxyCODONE (OXY IR/ROXICODONE) 5 MG immediate release tablet Take 1 tablet (5 mg total) by mouth 2 (two) times daily as needed for severe pain (pain score 7-10). 10/28/20   Love, Evlyn Kanner, PA-C  rosuvastatin (CRESTOR) 10 MG tablet Take 10 mg by mouth daily.    [provider]  sacubitril-valsartan (ENTRESTO) 24-26 MG Take 1 tablet by mouth 2 (two) times daily. 10/28/20 11/27/20  Love, Evlyn Kanner, PA-C  spironolactone (ALDACTONE) 25 MG tablet Take one-half tablet (12.5 mg total) by mouth daily. 10/28/20 11/27/20  Love, Evlyn Kanner, PA-C  traZODone (DESYREL)  50 MG tablet Take one-half tablet (25 mg total) by mouth at bedtime as needed for sleep. 10/28/20   Jacquelynn Cree, PA-C    Physical Exam: Vitals:   11/13/20 0230 11/13/20 0315 11/13/20 0352 11/13/20 0500  BP: 114/60 (!) 106/58 131/64 126/78  Pulse: 76 81 81 86  Resp: 16  19 14   Temp:      TempSrc:      SpO2: 95% 98% 96% 96%  Height:        Vitals:   11/13/20 0230 11/13/20 0315 11/13/20 0352 11/13/20 0500  BP: 114/60 (!) 106/58 131/64 126/78  Pulse: 76 81 81 86  Resp: 16  19 14   Temp:      TempSrc:      SpO2: 95% 98% 96% 96%  Height:       General: WNWD woman who denies pain but is restless. Eyes: PERRL, lids and conjunctivae normal ENMT: Mucous membranes  are moist. Posterior pharynx clear of any exudate or lesions. Neck: normal, supple, no masses, no thyromegaly Respiratory: clear to auscultation bilaterally, no wheezing, no crackles. Normal respiratory effort. No accessory muscle use.  Cardiovascular: Regular rate and rhythm, possible I/VI murmur at LSB / no rubs / no gallops. No extremity edema. 2+ pedal pulses. No carotid bruits.  Abdomen: Bowel sounds positive. Catheter site RUQ with minimal erythema and scant drainage. No tenderness to deep palpation RUQ or transverse abdomen. Musculoskeletal: no clubbing / cyanosis. No joint deformity upper and lower extremities. Good ROM, no contractures. Normal muscle tone.  Skin: no rashes, lesions, ulcers. No induration Neurologic: CN 2-12 grossly intact. Sensation intact, DTR normal. Strength 5/5 in all 4.  Psychiatric: Normal judgment and insight. Alert and oriented x 3. Normal mood.     Labs on Admission: I have personally reviewed following labs and imaging studies  CBC: Recent Labs  Lab 11/12/20 1934  WBC 9.5  NEUTROABS 6.0  HGB 12.4  HCT 40.3  MCV 88.2  PLT 278   Basic Metabolic Panel: Recent Labs  Lab 11/12/20 1934  NA 140  K 4.4  CL 108  CO2 24  GLUCOSE 115*  BUN 9  CREATININE 0.77  CALCIUM 8.6*    GFR: CrCl cannot be calculated (Unknown ideal weight.). Liver Function Tests: Recent Labs  Lab 11/12/20 1934  AST 23  ALT 20  ALKPHOS 125  BILITOT 0.5  PROT 6.3*  ALBUMIN 3.1*   Recent Labs  Lab 11/12/20 1934  LIPASE 37   No results for input(s): AMMONIA in the last 168 hours. Coagulation Profile: No results for input(s): INR, PROTIME in the last 168 hours. Cardiac Enzymes: No results for input(s): CKTOTAL, CKMB, CKMBINDEX, TROPONINI in the last 168 hours. BNP (last 3 results) No results for input(s): PROBNP in the last 8760 hours. HbA1C: No results for input(s): HGBA1C in the last 72 hours. CBG: No results for input(s): GLUCAP in the last 168 hours. Lipid Profile: No results for input(s): CHOL, HDL, LDLCALC, TRIG, CHOLHDL, LDLDIRECT in the last 72 hours. Thyroid Function Tests: No results for input(s): TSH, T4TOTAL, FREET4, T3FREE, THYROIDAB in the last 72 hours. Anemia Panel: No results for input(s): VITAMINB12, FOLATE, FERRITIN, TIBC, IRON, RETICCTPCT in the last 72 hours. Urine analysis:    Component Value Date/Time   COLORURINE YELLOW 10/26/2020 0700   APPEARANCEUR HAZY (A) 10/26/2020 0700   LABSPEC 1.015 10/26/2020 0700   PHURINE 5.0 10/26/2020 0700   GLUCOSEU NEGATIVE 10/26/2020 0700   HGBUR NEGATIVE 10/26/2020 0700   BILIRUBINUR NEGATIVE 10/26/2020 0700   KETONESUR NEGATIVE 10/26/2020 0700   PROTEINUR NEGATIVE 10/26/2020 0700   NITRITE NEGATIVE 10/26/2020 0700   LEUKOCYTESUR TRACE (A) 10/26/2020 0700    Radiological Exams on Admission: CT ABDOMEN PELVIS W CONTRAST  Result Date: 11/13/2020 CLINICAL DATA:  Acute nonlocalized abdominal pain. Infected cholecystostomy catheter. Right lower quadrant abdominal pain. EXAM: CT ABDOMEN AND PELVIS WITH CONTRAST TECHNIQUE: Multidetector CT imaging of the abdomen and pelvis was performed using the standard protocol following bolus administration of intravenous contrast. CONTRAST:  OMNIPAQUE IOHEXOL 300 MG/ML   SOLN COMPARISON:  10/26/2020 FINDINGS: Lower chest: Visualized lung bases are clear. Mild coronary artery calcification. Global cardiac size within normal limits. Hepatobiliary: Percutaneous drainage catheter is seen looped within the gallbladder fundus, similar to that noted on prior examination. Minimal pericatheter inflammatory change adjacent to the gallbladder fossa. No discrete perihepatic or subcutaneous fluid collection identified. The liver is unremarkable. No intra or extrahepatic biliary ductal  dilation. Pancreas: Unremarkable Spleen: Unremarkable Adrenals/Urinary Tract: The adrenal glands are unremarkable. The kidneys are normal in size and position. Stable 10 mm exophytic low-attenuation lesion arises from the interpolar region of the right kidney likely representing a small cortical cyst. The kidneys are otherwise unremarkable. The bladder is unremarkable. Stomach/Bowel: Stomach, small bowel, and large bowel are unremarkable. Appendix absent. No free intraperitoneal gas or fluid. Vascular/Lymphatic: Mild aortoiliac atherosclerotic calcification. No aortic aneurysm. No pathologic adenopathy within the abdomen and pelvis. Reproductive: Uterus and bilateral adnexa are unremarkable. Other: No abdominal wall hernia.  Rectum unremarkable. Musculoskeletal: 6 non rib bearing segments of the lumbar spine. Stable compression deformity with retropulsion of L4 with approximately 50% loss of height. Spinal fusion with instrumentation and resection of the spinous process of L4 has been performed of L2-L6. No acute bone abnormality. No lytic or blastic bone lesion. IMPRESSION: Percutaneous cholecystostomy catheter in appropriate position. Trace pericholecystic inflammatory change along the tract of the catheter. However, no discrete pericatheter, perihepatic, or subcutaneous fluid collection identified. Aortic Atherosclerosis (ICD10-I70.0). Electronically Signed   By: Helyn Numbers M.D.   On: 11/13/2020 03:54     EKG: Independently reviewed. 10/14/20 NSR, no acute change  Assessment/Plan Active Problems:   Wound infection following procedure   Acute cholecystitis   Chronic diastolic CHF (congestive heart failure) (HCC)   Essential hypertension   Wound infection after surgery   Wound infection - mild erythema and drainage from cholecystostomy site. No fever or chills, no leukocytosis Plan Wound culture  Zosyn q 8  GS to decide about removal of catheter  Cholecystitis - no tenderness or sign of active infection.  Plan Per Dr. Bedelia Person - patient will need cholangiogram to r/o obstruction stone or persistent infections. GS to order when OR  time arranged. NPO at this time to not delay study   3. Chronic DD grade 2 - patient well compensated Plan Continue home meds.   4. HTN - stable  DVT prophylaxis: heparin SQ \ Code Status: full code  Family Communication: daughter present during exam. Had no question about dxor tx plan.   Disposition Plan: home when stable  Consults called: GS - Dr. Bedelia Person (with names) Admission status: obs )   Illene Regulus MD [facility] Pager [phone]  If 7PM-7AM, please contact night-coverage [email or web address] Password [email or web address]  11/13/2020, 5:55 AM

## 2020-11-14 ENCOUNTER — Ambulatory Visit: Payer: Medicare HMO | Admitting: Physical Therapy

## 2020-11-14 ENCOUNTER — Observation Stay (HOSPITAL_COMMUNITY): Payer: Medicare HMO | Admitting: Anesthesiology

## 2020-11-14 ENCOUNTER — Encounter (HOSPITAL_COMMUNITY)
Admission: EM | Disposition: A | Discharge status: Home / Self Care | Payer: Self-pay | Source: Home / Self Care | Attending: Emergency Medicine

## 2020-11-14 ENCOUNTER — Encounter (HOSPITAL_COMMUNITY): Payer: Self-pay | Admitting: Internal Medicine

## 2020-11-14 DIAGNOSIS — Z20822 Contact with and (suspected) exposure to covid-19: Secondary | ICD-10-CM | POA: Diagnosis not present

## 2020-11-14 DIAGNOSIS — T148XXA Other injury of unspecified body region, initial encounter: Secondary | ICD-10-CM | POA: Diagnosis not present

## 2020-11-14 DIAGNOSIS — K810 Acute cholecystitis: Secondary | ICD-10-CM

## 2020-11-14 DIAGNOSIS — T8140XA Infection following a procedure, unspecified, initial encounter: Secondary | ICD-10-CM | POA: Diagnosis not present

## 2020-11-14 DIAGNOSIS — K8012 Calculus of gallbladder with acute and chronic cholecystitis without obstruction: Secondary | ICD-10-CM | POA: Diagnosis not present

## 2020-11-14 DIAGNOSIS — T85598A Other mechanical complication of other gastrointestinal prosthetic devices, implants and grafts, initial encounter: Secondary | ICD-10-CM | POA: Diagnosis not present

## 2020-11-14 DIAGNOSIS — L089 Local infection of the skin and subcutaneous tissue, unspecified: Secondary | ICD-10-CM | POA: Diagnosis not present

## 2020-11-14 HISTORY — PX: CHOLECYSTECTOMY: SHX55

## 2020-11-14 LAB — CBC
HCT: 36.5 % (ref 36.0–46.0)
Hemoglobin: 11.6 g/dL — ABNORMAL LOW (ref 12.0–15.0)
MCH: 27.6 pg (ref 26.0–34.0)
MCHC: 31.8 g/dL (ref 30.0–36.0)
MCV: 86.7 fL (ref 80.0–100.0)
Platelets: 247 10*3/uL (ref 150–400)
RBC: 4.21 MIL/uL (ref 3.87–5.11)
RDW: 17 % — ABNORMAL HIGH (ref 11.5–15.5)
WBC: 6.4 10*3/uL (ref 4.0–10.5)
nRBC: 0 % (ref 0.0–0.2)

## 2020-11-14 SURGERY — LAPAROSCOPIC CHOLECYSTECTOMY
Anesthesia: General | Site: Abdomen

## 2020-11-14 MED ORDER — FENTANYL CITRATE (PF) 250 MCG/5ML IJ SOLN
INTRAMUSCULAR | Status: AC
  Filled 2020-11-14: qty 5

## 2020-11-14 MED ORDER — SUGAMMADEX SODIUM 200 MG/2ML IV SOLN
INTRAVENOUS | Status: DC | PRN
Start: 2020-11-14 — End: 2020-11-14
  Administered 2020-11-14: 200 mg via INTRAVENOUS

## 2020-11-14 MED ORDER — CHLORHEXIDINE GLUCONATE 0.12 % MT SOLN
OROMUCOSAL | Status: AC
  Administered 2020-11-14: 15 mL via OROMUCOSAL
  Filled 2020-11-14: qty 15

## 2020-11-14 MED ORDER — LIDOCAINE 2% (20 MG/ML) 5 ML SYRINGE
INTRAMUSCULAR | Status: DC | PRN
  Administered 2020-11-14: 60 mg via INTRAVENOUS

## 2020-11-14 MED ORDER — LACTATED RINGERS IV SOLN: INTRAVENOUS | Status: DC

## 2020-11-14 MED ORDER — PROPOFOL 10 MG/ML IV BOLUS
INTRAVENOUS | Status: DC | PRN
  Administered 2020-11-14: 30 mg via INTRAVENOUS
  Administered 2020-11-14: 120 mg via INTRAVENOUS

## 2020-11-14 MED ORDER — LIDOCAINE HCL (PF) 2 % IJ SOLN
INTRAMUSCULAR | Status: AC
  Filled 2020-11-14: qty 5

## 2020-11-14 MED ORDER — SACUBITRIL-VALSARTAN 24-26 MG PO TABS
1.0000 | ORAL_TABLET | Freq: Two times a day (BID) | ORAL | Status: DC
  Filled 2020-11-14 (×2): qty 1

## 2020-11-14 MED ORDER — LABETALOL HCL 5 MG/ML IV SOLN
INTRAVENOUS | Status: DC | PRN
  Administered 2020-11-14: 5 mg via INTRAVENOUS

## 2020-11-14 MED ORDER — ONDANSETRON HCL 4 MG/2ML IJ SOLN
INTRAMUSCULAR | Status: DC | PRN
  Administered 2020-11-14: 4 mg via INTRAVENOUS

## 2020-11-14 MED ORDER — 0.9 % SODIUM CHLORIDE (POUR BTL) OPTIME
TOPICAL | Status: DC | PRN
  Administered 2020-11-14: 1000 mL

## 2020-11-14 MED ORDER — SPIRONOLACTONE 12.5 MG HALF TABLET
12.5000 mg | ORAL_TABLET | Freq: Every day | ORAL | Status: DC
  Filled 2020-11-14: qty 1

## 2020-11-14 MED ORDER — SODIUM CHLORIDE 0.9 % IR SOLN
Status: DC | PRN
  Administered 2020-11-14: 1000 mL

## 2020-11-14 MED ORDER — HYDROMORPHONE HCL 1 MG/ML IJ SOLN
0.2500 mg | INTRAMUSCULAR | Status: DC | PRN
  Administered 2020-11-14: 0.5 mg via INTRAVENOUS

## 2020-11-14 MED ORDER — HYDROMORPHONE HCL 1 MG/ML IJ SOLN
INTRAMUSCULAR | Status: AC
  Filled 2020-11-14: qty 1

## 2020-11-14 MED ORDER — ROCURONIUM BROMIDE 10 MG/ML (PF) SYRINGE
PREFILLED_SYRINGE | INTRAVENOUS | Status: DC | PRN
Start: 2020-11-14 — End: 2020-11-14
  Administered 2020-11-14: 60 mg via INTRAVENOUS

## 2020-11-14 MED ORDER — ORAL CARE MOUTH RINSE: 15.0000 mL | Freq: Once | OROMUCOSAL | Status: AC

## 2020-11-14 MED ORDER — FENTANYL CITRATE (PF) 250 MCG/5ML IJ SOLN
INTRAMUSCULAR | Status: DC | PRN
  Administered 2020-11-14 (×3): 50 ug via INTRAVENOUS
  Administered 2020-11-14: 100 ug via INTRAVENOUS

## 2020-11-14 MED ORDER — BUPIVACAINE-EPINEPHRINE 0.25% -1:200000 IJ SOLN
INTRAMUSCULAR | Status: DC | PRN
  Administered 2020-11-14: 30 mL

## 2020-11-14 MED ORDER — DEXAMETHASONE SODIUM PHOSPHATE 10 MG/ML IJ SOLN
INTRAMUSCULAR | Status: DC | PRN
  Administered 2020-11-14: 4 mg via INTRAVENOUS

## 2020-11-14 MED ORDER — CHLORHEXIDINE GLUCONATE 0.12 % MT SOLN: 15.0000 mL | Freq: Once | OROMUCOSAL | Status: AC

## 2020-11-14 MED ORDER — BUPIVACAINE-EPINEPHRINE (PF) 0.25% -1:200000 IJ SOLN
INTRAMUSCULAR | Status: AC
  Filled 2020-11-14: qty 30

## 2020-11-14 MED ORDER — PROPOFOL 10 MG/ML IV BOLUS
INTRAVENOUS | Status: AC
  Filled 2020-11-14: qty 20

## 2020-11-14 SURGICAL SUPPLY — 40 items
ADH SKN CLS APL DERMABOND .7 (GAUZE/BANDAGES/DRESSINGS) ×2
APL PRP STRL LF DISP 70% ISPRP (MISCELLANEOUS) ×2
BAG COUNTER SPONGE SURGICOUNT (BAG) ×3 IMPLANT
BAG SPEC RTRVL 10 TROC 200 (ENDOMECHANICALS) ×2
BAG SPNG CNTER NS LX DISP (BAG) ×2
BLADE CLIPPER SURG (BLADE) IMPLANT
CANISTER SUCT 3000ML PPV (MISCELLANEOUS) ×3 IMPLANT
CHLORAPREP W/TINT 26 (MISCELLANEOUS) ×3 IMPLANT
CLIP LIGATING HEMO LOK XL GOLD (MISCELLANEOUS) IMPLANT
CLIP LIGATING HEMO O LOK GREEN (MISCELLANEOUS) ×3 IMPLANT
COVER SURGICAL LIGHT HANDLE (MISCELLANEOUS) ×3 IMPLANT
DERMABOND ADVANCED (GAUZE/BANDAGES/DRESSINGS) ×1
DERMABOND ADVANCED .7 DNX12 (GAUZE/BANDAGES/DRESSINGS) ×2 IMPLANT
DRSG COVADERM PLUS 2X2 (GAUZE/BANDAGES/DRESSINGS) ×2 IMPLANT
ELECT REM PT RETURN 9FT ADLT (ELECTROSURGICAL) ×3
ELECTRODE REM PT RTRN 9FT ADLT (ELECTROSURGICAL) ×2 IMPLANT
GLOVE SURG POLYISO LF SZ7 (GLOVE) ×3 IMPLANT
GLOVE SURG UNDER POLY LF SZ7 (GLOVE) ×3 IMPLANT
GOWN STRL REUS W/ TWL LRG LVL3 (GOWN DISPOSABLE) ×6 IMPLANT
GOWN STRL REUS W/TWL LRG LVL3 (GOWN DISPOSABLE) ×9
GRASPER SUT TROCAR 14GX15 (MISCELLANEOUS) ×3 IMPLANT
KIT BASIN OR (CUSTOM PROCEDURE TRAY) ×3 IMPLANT
KIT TURNOVER KIT B (KITS) ×3 IMPLANT
NEEDLE 22X1 1/2 (OR ONLY) (NEEDLE) ×3 IMPLANT
NS IRRIG 1000ML POUR BTL (IV SOLUTION) ×3 IMPLANT
PAD ARMBOARD 7.5X6 YLW CONV (MISCELLANEOUS) ×3 IMPLANT
POUCH RETRIEVAL ECOSAC 10 (ENDOMECHANICALS) ×2 IMPLANT
POUCH RETRIEVAL ECOSAC 10MM (ENDOMECHANICALS) ×3
SCISSORS LAP 5X35 DISP (ENDOMECHANICALS) ×3 IMPLANT
SET IRRIG TUBING LAPAROSCOPIC (IRRIGATION / IRRIGATOR) ×3 IMPLANT
SET TUBE SMOKE EVAC HIGH FLOW (TUBING) ×3 IMPLANT
SLEEVE ENDOPATH XCEL 5M (ENDOMECHANICALS) ×6 IMPLANT
SPECIMEN JAR SMALL (MISCELLANEOUS) ×3 IMPLANT
SUT MNCRL AB 4-0 PS2 18 (SUTURE) ×3 IMPLANT
TOWEL GREEN STERILE (TOWEL DISPOSABLE) ×3 IMPLANT
TOWEL GREEN STERILE FF (TOWEL DISPOSABLE) ×3 IMPLANT
TRAY LAPAROSCOPIC MC (CUSTOM PROCEDURE TRAY) ×3 IMPLANT
TROCAR XCEL 12X100 BLDLESS (ENDOMECHANICALS) ×3 IMPLANT
TROCAR XCEL NON-BLD 5MMX100MML (ENDOMECHANICALS) ×3 IMPLANT
WATER STERILE IRR 1000ML POUR (IV SOLUTION) ×3 IMPLANT

## 2020-11-14 NOTE — Op Note (Signed)
PATIENT:  Heather [name]  75 y.o. female  PRE-OPERATIVE DIAGNOSIS:  Prior cholecystostomy, dysfunctional cholecystostomy tube  POST-OPERATIVE DIAGNOSIS:  Prior cholecystostomy, dysfunctional cholecystostomy tube  PROCEDURE:  Procedure(s): LAPAROSCOPIC CHOLECYSTECTOMY  SURGEON:  Kinsinger, De Blanch, MD   ASSISTANT: Aquilla Solian, MD  ANESTHESIA:   local and general  Indications for procedure: [name] is a 75 y.o. female with symptoms of Abdominal pain consistent with gallbladder disease, Confirmed by CT.  Description of procedure: The patient was brought into the operative suite, placed supine. Anesthesia was administered with endotracheal tube. Patient was strapped in place and foot board was secured. All pressure points were offloaded by foam padding. The patient was prepped and draped in the usual sterile fashion.  A left subcostal incision was made and optical entry was used to enter the abdomen. 2 5 mm trocars were placed on in the right lateral space on in the right subcostal space. A 29mm trocar was placed in the subxiphoid space. Marcaine/Exparel mix was infused to the subxiphoid space and lateral upper right abdomen in the transversus abdominis plane. Next the patient was placed in reverse trendelenberg. The gallbladder appearedchronically inflamed. Omentum was adhered to the gallbladder and was taken down with cautery/blunt dissection.  The gallbladder was retracted cephalad and lateral. The peritoneum was reflected off the infundibulum working lateral to medial. The cystic duct and cystic artery were identified and further dissection revealed a critical view. The cystic duct and cystic artery were doubly clipped and ligated.   The gallbladder was removed off the liver bed with cautery. The Gallbladder was placed in a specimen bag. The gallbladder fossa was irrigated and hemostasis was applied with cautery. The gallbladder was removed via the 68mm trocar. The  fascial defect was closed with interrupted 0 vicryl suture via laparoscopic trans-fascial suture passer. Pneumoperitoneum was removed, all trocar were removed. All incisions were closed with 4-0 monocryl subcuticular stitch and dermabond. The patient woke from anesthesia and was brought to PACU in stable condition. All counts were correct  Findings: chronically inflamed gallbladder with cholecystostomy tube in place  Specimen: gallbladder  Blood loss: minimal  Local anesthesia: 50 ml Marcaine/Exparel mix  Complications: none  PLAN OF CARE: Discharge to home after PACU  PATIENT DISPOSITION:  PACU - hemodynamically stable.  [facility], [place]

## 2020-11-14 NOTE — Anesthesia Postprocedure Evaluation (Signed)
Anesthesia Post Note  Patient: Heather [name]  Procedure(s) Performed: LAPAROSCOPIC CHOLECYSTECTOMY (Abdomen)     Anesthesia Post Evaluation No notable events documented.  Last Vitals:  Vitals:   11/14/20 1450 11/14/20 1500  BP: (!) 161/96 (!) 161/96  Pulse: 78 77  Resp: (!) 23 (!) 27  Temp:    SpO2: 92% 93%    Last Pain:  Vitals:   11/14/20 1420  TempSrc:   PainSc: Asleep                 Roselie Cirigliano

## 2020-11-14 NOTE — Anesthesia Procedure Notes (Signed)
Procedure Name: Intubation Date/Time: 11/14/2020 10:57 AM Performed by: Wilburn Cornelia, CRNA Pre-anesthesia Checklist: Patient identified, Emergency Drugs available, Suction available, Patient being monitored and Timeout performed Patient Re-evaluated:Patient Re-evaluated prior to induction Oxygen Delivery Method: Circle system utilized Preoxygenation: Pre-oxygenation with 100% oxygen Induction Type: IV induction Ventilation: Mask ventilation without difficulty Laryngoscope Size: Mac and 3 Grade View: Grade I Tube type: Oral Tube size: 7.0 mm Number of attempts: 1 Airway Equipment and Method: Stylet Placement Confirmation: ETT inserted through vocal cords under direct vision and positive ETCO2 Secured at: 21 cm Tube secured with: Tape Dental Injury: Teeth and Oropharynx as per pre-operative assessment

## 2020-11-14 NOTE — Transfer of Care (Signed)
Immediate Anesthesia Transfer of Care Note  Patient: Heather [name]  Procedure(s) Performed: LAPAROSCOPIC CHOLECYSTECTOMY (Abdomen)  Patient Location: PACU  Anesthesia Type:General  Level of Consciousness: drowsy  Airway & Oxygen Therapy: Patient Spontanous Breathing and Patient connected to nasal cannula oxygen  Post-op Assessment: Report given to RN and Post -op Vital signs reviewed and stable  Post vital signs: Reviewed and stable  Last Vitals:  Vitals Value Taken Time  BP 167/84 11/14/20 1220  Temp    Pulse 76 11/14/20 1224  Resp 15 11/14/20 1224  SpO2 99 % 11/14/20 1224  Vitals shown include unvalidated device data.  Last Pain:  Vitals:   11/14/20 0934  TempSrc:   PainSc: 0-No pain      Patients Stated Pain Goal: 0 (11/14/20 0115)  Complications: No notable events documented.

## 2020-11-14 NOTE — Anesthesia Preprocedure Evaluation (Signed)
Anesthesia Evaluation  Patient identified by MRN, date of birth, ID band Patient awake    Reviewed: Allergy & Precautions, NPO status , Patient's Chart, lab work & pertinent test results  History of Anesthesia Complications (+) PONV  Airway Mallampati: II  TM Distance: >3 FB     Dental   Pulmonary pneumonia, former smoker,    breath sounds clear to auscultation       Cardiovascular hypertension, + Past MI and +CHF   Rhythm:Regular Rate:Normal     Neuro/Psych  Headaches, Anxiety Depression    GI/Hepatic Neg liver ROS, History noted Dr. Chilton Si   Endo/Other    Renal/GU Renal disease     Musculoskeletal   Abdominal   Peds  Hematology   Anesthesia Other Findings   Reproductive/Obstetrics                             Anesthesia Physical Anesthesia Plan  ASA: 3  Anesthesia Plan: General   Post-op Pain Management:    Induction: Intravenous  PONV Risk Score and Plan: 4 or greater and Ondansetron, Dexamethasone and Midazolam  Airway Management Planned: Oral ETT  Additional Equipment:   Intra-op Plan:   Post-operative Plan: Extubation in OR  Informed Consent: I have reviewed the patients History and Physical, chart, labs and discussed the procedure including the risks, benefits and alternatives for the proposed anesthesia with the patient or authorized representative who has indicated his/her understanding and acceptance.     Dental advisory given  Plan Discussed with: CRNA and Anesthesiologist  Anesthesia Plan Comments:         Anesthesia Quick Evaluation

## 2020-11-14 NOTE — Progress Notes (Signed)
Pre Procedure note for inpatients:   Heather [name] has been scheduled for Procedure(s): LAPAROSCOPIC CHOLECYSTECTOMY (N/A) INTRAOPERATIVE CHOLANGIOGRAM (N/A) today. The various methods of treatment have been discussed with the patient. After consideration of the risks, benefits and treatment options the patient has consented to the planned procedure.   The patient has been seen and labs reviewed. There are no changes in the patient's condition to prevent proceeding with the planned procedure today.  Recent labs:  Lab Results  Component Value Date   WBC 6.4 11/14/2020   HGB 11.6 (L) 11/14/2020   HCT 36.5 11/14/2020   PLT 247 11/14/2020   GLUCOSE 108 (H) 11/13/2020   ALT 20 11/12/2020   AST 23 11/12/2020   NA 138 11/13/2020   K 4.1 11/13/2020   CL 109 11/13/2020   CREATININE 0.77 11/13/2020   BUN 7 (L) 11/13/2020   CO2 24 11/13/2020   TSH 3.324 10/09/2020   INR 1.3 (H) 10/12/2020    Rodman Pickle, MD 11/14/2020 9:43 AM

## 2020-11-14 NOTE — ED Notes (Signed)
Report given to Dena Billet, RN of short stay bay 33

## 2020-11-14 NOTE — Progress Notes (Signed)
PROGRESS NOTE    Heather [name]  VZC:[id number] DOB: 01/23/1946 DOA: 11/12/2020 PCP: Janece Canterbury, MD   Brief Narrative: 75 year old with past medical history significant for L3 burst fracture  s/p repair March 2022, distal femoral fracture with ORIF Eliquis 2022 June she was diagnosed with cholecystitis.  She was not a surgical candidate at that time and a cystostomy catheter to gallbladder was placed.  She received antibiotics for sepsis.  She presents with 2-day history of redness around the catheter site along with purulent drainage.  Evaluation in the ED CT abdomen was negative for abscess.  General surgery was consulted, plan is for Sx 9/26   Assessment & Plan:   Active Problems:   Essential hypertension   Acute cholecystitis   Wound infection following procedure   Chronic diastolic CHF (congestive heart failure) (HCC)   Wound infection after surgery   1-History of cholecystitis status post cholecystostomy tube placement August 2022.  Wound infection around catheter site with drainage purulent secretion: -Patient was a started on ceftriaxone and Flagyl continue -Surgery consulted, planning cholecystectomy 9/26   -Clear by cardiology for surgery.   2-Chronic diastolic dysfunction grade 2: Hold Entresto and spironolactone today (NPO, for Sx), reorder for tomorrow.    3-Hypertension: Continue with metoprolol. Will add PRN hydralazine.    Estimated body mass index is 26.56 kg/m as calculated from the following:   Height as of this encounter: 5' 3.5" (1.613 m).   Weight as of 10/28/20: 69.1 kg.   DVT prophylaxis: Heparin  Code Status: Full code Family Communication: Daughter who was at bedside Disposition Plan:  Status is: Observation  The patient remains OBS appropriate and will d/c before 2 midnights.  Dispo: The patient is from: Home              Anticipated d/c is to: Home              Patient currently is not medically stable to d/c.   Difficult to  place patient No        Consultants:  General sx  Procedures:   Antimicrobials:  Ceftriaxone , flagyl.   Subjective: She report mild soreness at catheter site.  She denies dyspnea or chest pain   Objective: Vitals:   11/14/20 0030 11/14/20 0115 11/14/20 0300 11/14/20 0500  BP: 133/62 133/68 128/63 134/72  Pulse: 73 71 71 73  Resp: 18 18 18 18   Temp:      TempSrc:      SpO2: 98% 94% 95% 95%  Height:        Intake/Output Summary (Last 24 hours) at 11/14/2020 0827 Last data filed at 11/13/2020 2305 Gross per 24 hour  Intake --  Output 1100 ml  Net -1100 ml    There were no vitals filed for this visit.  Examination:  General exam: NAD Respiratory system: CTA Cardiovascular system: S 1, S 2 RRR Gastrointestinal system: BS present, soft, percutaneous catheter in place RUQ Central nervous system: Alert, follows command Extremities: No edema   Data Reviewed: I have personally reviewed following labs and imaging studies  CBC: Recent Labs  Lab 11/12/20 1934 11/14/20 0609  WBC 9.5 6.4  NEUTROABS 6.0  --   HGB 12.4 11.6*  HCT 40.3 36.5  MCV 88.2 86.7  PLT 278 247    Basic Metabolic Panel: Recent Labs  Lab 11/12/20 1934 11/13/20 0732  NA 140 138  K 4.4 4.1  CL 108 109  CO2 24 24  GLUCOSE 115* 108*  BUN  9 7*  CREATININE 0.77 0.77  CALCIUM 8.6* 8.2*    GFR: CrCl cannot be calculated (Unknown ideal weight.). Liver Function Tests: Recent Labs  Lab 11/12/20 1934  AST 23  ALT 20  ALKPHOS 125  BILITOT 0.5  PROT 6.3*  ALBUMIN 3.1*    Recent Labs  Lab 11/12/20 1934  LIPASE 37    No results for input(s): AMMONIA in the last 168 hours. Coagulation Profile: No results for input(s): INR, PROTIME in the last 168 hours. Cardiac Enzymes: No results for input(s): CKTOTAL, CKMB, CKMBINDEX, TROPONINI in the last 168 hours. BNP (last 3 results) No results for input(s): PROBNP in the last 8760 hours. HbA1C: No results for input(s): HGBA1C in  the last 72 hours. CBG: No results for input(s): GLUCAP in the last 168 hours. Lipid Profile: No results for input(s): CHOL, HDL, LDLCALC, TRIG, CHOLHDL, LDLDIRECT in the last 72 hours. Thyroid Function Tests: No results for input(s): TSH, T4TOTAL, FREET4, T3FREE, THYROIDAB in the last 72 hours. Anemia Panel: No results for input(s): VITAMINB12, FOLATE, FERRITIN, TIBC, IRON, RETICCTPCT in the last 72 hours. Sepsis Labs: No results for input(s): PROCALCITON, LATICACIDVEN in the last 168 hours.  Recent Results (from the past 240 hour(s))  Resp Panel by RT-PCR (Flu A&B, Covid) Nasopharyngeal Swab     Status: None   Collection Time: 11/13/20 12:59 AM   Specimen: Nasopharyngeal Swab; Nasopharyngeal(NP) swabs in vial transport medium  Result Value Ref Range Status   SARS Coronavirus 2 by RT PCR NEGATIVE NEGATIVE Final    Comment: (NOTE) SARS-CoV-2 target nucleic acids are NOT DETECTED.  The SARS-CoV-2 RNA is generally detectable in upper respiratory specimens during the acute phase of infection. The lowest concentration of SARS-CoV-2 viral copies this assay can detect is 138 copies/mL. A negative result does not preclude SARS-Cov-2 infection and should not be used as the sole basis for treatment or other patient management decisions. A negative result may occur with  improper specimen collection/handling, submission of specimen other than nasopharyngeal swab, presence of viral mutation(s) within the areas targeted by this assay, and inadequate number of viral copies(<138 copies/mL). A negative result must be combined with clinical observations, patient history, and epidemiological information. The expected result is Negative.  Fact Sheet for Patients:  BloggerCourse.com  Fact Sheet for Healthcare Providers:  SeriousBroker.it  This test is no t yet approved or cleared by the [place] FDA and  has been authorized for detection  and/or diagnosis of SARS-CoV-2 by FDA under an Emergency Use Authorization (EUA). This EUA will remain  in effect (meaning this test can be used) for the duration of the COVID-19 declaration under Section 564(b)(1) of the Act, 21 U.S.C.section 360bbb-3(b)(1), unless the authorization is terminated  or revoked sooner.       Influenza A by PCR NEGATIVE NEGATIVE Final   Influenza B by PCR NEGATIVE NEGATIVE Final    Comment: (NOTE) The Xpert Xpress SARS-CoV-2/FLU/RSV plus assay is intended as an aid in the diagnosis of influenza from Nasopharyngeal swab specimens and should not be used as a sole basis for treatment. Nasal washings and aspirates are unacceptable for Xpert Xpress SARS-CoV-2/FLU/RSV testing.  Fact Sheet for Patients: BloggerCourse.com  Fact Sheet for Healthcare Providers: SeriousBroker.it  This test is not yet approved or cleared by the [place] FDA and has been authorized for detection and/or diagnosis of SARS-CoV-2 by FDA under an Emergency Use Authorization (EUA). This EUA will remain in effect (meaning this test can be used) for the duration of  the COVID-19 declaration under Section 564(b)(1) of the Act, 21 U.S.C. section 360bbb-3(b)(1), unless the authorization is terminated or revoked.  Performed at [facility], [address] [place], [place] [address]           Radiology Studies: CT ABDOMEN PELVIS W CONTRAST  Result Date: 11/13/2020 CLINICAL DATA:  Acute nonlocalized abdominal pain. Infected cholecystostomy catheter. Right lower quadrant abdominal pain. EXAM: CT ABDOMEN AND PELVIS WITH CONTRAST TECHNIQUE: Multidetector CT imaging of the abdomen and pelvis was performed using the standard protocol following bolus administration of intravenous contrast. CONTRAST:  OMNIPAQUE IOHEXOL 300 MG/ML  SOLN COMPARISON:  10/26/2020 FINDINGS: Lower chest: Visualized lung bases are clear. Mild coronary  artery calcification. Global cardiac size within normal limits. Hepatobiliary: Percutaneous drainage catheter is seen looped within the gallbladder fundus, similar to that noted on prior examination. Minimal pericatheter inflammatory change adjacent to the gallbladder fossa. No discrete perihepatic or subcutaneous fluid collection identified. The liver is unremarkable. No intra or extrahepatic biliary ductal dilation. Pancreas: Unremarkable Spleen: Unremarkable Adrenals/Urinary Tract: The adrenal glands are unremarkable. The kidneys are normal in size and position. Stable 10 mm exophytic low-attenuation lesion arises from the interpolar region of the right kidney likely representing a small cortical cyst. The kidneys are otherwise unremarkable. The bladder is unremarkable. Stomach/Bowel: Stomach, small bowel, and large bowel are unremarkable. Appendix absent. No free intraperitoneal gas or fluid. Vascular/Lymphatic: Mild aortoiliac atherosclerotic calcification. No aortic aneurysm. No pathologic adenopathy within the abdomen and pelvis. Reproductive: Uterus and bilateral adnexa are unremarkable. Other: No abdominal wall hernia.  Rectum unremarkable. Musculoskeletal: 6 non rib bearing segments of the lumbar spine. Stable compression deformity with retropulsion of L4 with approximately 50% loss of height. Spinal fusion with instrumentation and resection of the spinous process of L4 has been performed of L2-L6. No acute bone abnormality. No lytic or blastic bone lesion. IMPRESSION: Percutaneous cholecystostomy catheter in appropriate position. Trace pericholecystic inflammatory change along the tract of the catheter. However, no discrete pericatheter, perihepatic, or subcutaneous fluid collection identified. Aortic Atherosclerosis (ICD10-I70.0). Electronically Signed   By: Helyn Numbers M.D.   On: 11/13/2020 03:54        Scheduled Meds:  aspirin EC  81 mg Oral Daily   buPROPion  300 mg Oral Daily   gabapentin   300 mg Oral Daily   gabapentin  600 mg Oral QHS   heparin  5,000 Units Subcutaneous Q8H   metoprolol succinate  50 mg Oral Daily   rosuvastatin  10 mg Oral Daily   [START ON 11/15/2020] sacubitril-valsartan  1 tablet Oral BID   [START ON 11/15/2020] spironolactone  12.5 mg Oral Daily   Continuous Infusions:  cefTRIAXone (ROCEPHIN)  IV Stopped (11/14/20 0631)   metronidazole 500 mg (11/14/20 0630)     LOS: 0 days    Time spent: 35 minutes.     Alba Cory, MD [facility]   If 7PM-7AM, please contact night-coverage [email or web address]  11/14/2020, 8:27 AM

## 2020-11-15 ENCOUNTER — Encounter (HOSPITAL_COMMUNITY): Payer: Self-pay | Admitting: General Surgery

## 2020-11-15 DIAGNOSIS — L089 Local infection of the skin and subcutaneous tissue, unspecified: Secondary | ICD-10-CM | POA: Diagnosis not present

## 2020-11-15 DIAGNOSIS — T148XXA Other injury of unspecified body region, initial encounter: Secondary | ICD-10-CM | POA: Diagnosis not present

## 2020-11-15 LAB — CBC
HCT: 39.1 % (ref 36.0–46.0)
Hemoglobin: 12.6 g/dL (ref 12.0–15.0)
MCH: 27.3 pg (ref 26.0–34.0)
MCHC: 32.2 g/dL (ref 30.0–36.0)
MCV: 84.6 fL (ref 80.0–100.0)
Platelets: 288 10*3/uL (ref 150–400)
RBC: 4.62 MIL/uL (ref 3.87–5.11)
RDW: 16.6 % — ABNORMAL HIGH (ref 11.5–15.5)
WBC: 10.6 10*3/uL — ABNORMAL HIGH (ref 4.0–10.5)
nRBC: 0 % (ref 0.0–0.2)

## 2020-11-15 LAB — BASIC METABOLIC PANEL
Anion gap: 7 (ref 5–15)
BUN: 8 mg/dL (ref 8–23)
CO2: 24 mmol/L (ref 22–32)
Calcium: 8.6 mg/dL — ABNORMAL LOW (ref 8.9–10.3)
Chloride: 103 mmol/L (ref 98–111)
Creatinine, Ser: 0.63 mg/dL (ref 0.44–1.00)
GFR, Estimated: >60 mL/min (ref >60)
Glucose, Bld: 82 mg/dL (ref 70–99)
Potassium: 4 mmol/L (ref 3.5–5.1)
Sodium: 134 mmol/L — ABNORMAL LOW (ref 135–145)

## 2020-11-15 MED ORDER — ALUM & MAG HYDROXIDE-SIMETH 200-200-20 MG/5ML PO SUSP
30.0000 mL | Freq: Four times a day (QID) | ORAL | Status: DC | PRN
  Administered 2020-11-15: 30 mL via ORAL
  Filled 2020-11-15: qty 30

## 2020-11-15 MED ORDER — SPIRONOLACTONE 25 MG PO TABS: 12.5000 mg | ORAL_TABLET | Freq: Every day | ORAL | 0 refills | Status: DC

## 2020-11-15 MED ORDER — SPIRONOLACTONE 12.5 MG HALF TABLET: 12.5000 mg | ORAL_TABLET | Freq: Every day | ORAL | Status: DC

## 2020-11-15 MED ORDER — HYDROCODONE-ACETAMINOPHEN 5-325 MG PO TABS: 1.0000 | ORAL_TABLET | Freq: Four times a day (QID) | ORAL | 0 refills | Status: DC | PRN

## 2020-11-15 MED ORDER — HYDROCODONE-ACETAMINOPHEN 5-325 MG PO TABS: 1.0000 | ORAL_TABLET | ORAL | 0 refills | Status: DC | PRN

## 2020-11-15 NOTE — Progress Notes (Signed)
[facility] Progress Note  1 Day Post-Op  Subjective: CC:  Pain controlled. Tolerating PO. Endorses flatus. Reports she is voiding ok. States she has still been getting PT s/p femur fracture last month.  Objective: Vital signs in last 24 hours: Temp:  [98.2 F (36.8 C)-99.2 F (37.3 C)] 99.2 F (37.3 C) (09/27 0746) Pulse Rate:  [65-83] 80 (09/27 0746) Resp:  [12-27] 16 (09/27 0746) BP: (116-183)/(62-96) 116/68 (09/27 0746) SpO2:  [91 %-100 %] 94 % (09/27 0746) Weight:  [69.4 kg] 69.4 kg (09/26 0917) Last BM Date: 11/13/20  Intake/Output from previous day: 09/26 0701 - 09/27 0700 In: 1835.7 [P.O.:300; I.V.:1000; IV Piggyback:535.7] Out: 2410 [Urine:2400; Blood:10] Intake/Output this shift: Total I/O In: -  Out: 350 [Urine:350]  PE: Gen:  Alert, NAD, pleasant Pulm:  Normal effort ORA Abd: Soft, appropriately tender, non-distended, incisions C/D/I Skin: warm and dry, no rashes  Psych: A&Ox3   Lab Results:  Recent Labs    11/14/20 0609 11/15/20 0327  WBC 6.4 10.6*  HGB 11.6* 12.6  HCT 36.5 39.1  PLT 247 288   BMET Recent Labs    11/13/20 0732 11/15/20 0327  NA 138 134*  K 4.1 4.0  CL 109 103  CO2 24 24  GLUCOSE 108* 82  BUN 7* 8  CREATININE 0.77 0.63  CALCIUM 8.2* 8.6*   PT/INR No results for input(s): LABPROT, INR in the last 72 hours. CMP     Component Value Date/Time   NA 134 (L) 11/15/2020 0327   K 4.0 11/15/2020 0327   CL 103 11/15/2020 0327   CO2 24 11/15/2020 0327   GLUCOSE 82 11/15/2020 0327   BUN 8 11/15/2020 0327   CREATININE 0.63 11/15/2020 0327   CALCIUM 8.6 (L) 11/15/2020 0327   PROT 6.3 (L) 11/12/2020 1934   ALBUMIN 3.1 (L) 11/12/2020 1934   AST 23 11/12/2020 1934   ALT 20 11/12/2020 1934   ALKPHOS 125 11/12/2020 1934   BILITOT 0.5 11/12/2020 1934   GFRNONAA >60 11/15/2020 0327   Lipase     Component Value Date/Time   LIPASE 37 11/12/2020 1934       Studies/Results: No results  found.  Anti-infectives: Anti-infectives (From admission, onward)    Start     Dose/Rate Route Frequency Ordered Stop   11/13/20 0615  cefTRIAXone (ROCEPHIN) 2 g in sodium chloride 0.9 % 100 mL IVPB        2 g 200 mL/hr over 30 Minutes Intravenous Every 24 hours 11/13/20 0602     11/13/20 0615  metroNIDAZOLE (FLAGYL) IVPB 500 mg        500 mg 100 mL/hr over 60 Minutes Intravenous Every 12 hours 11/13/20 0602     11/13/20 0600  piperacillin-tazobactam (ZOSYN) IVPB 3.375 g  Status:  Discontinued        3.375 g 100 mL/hr over 30 Minutes Intravenous Every 8 hours 11/13/20 0536 11/13/20 0602   11/13/20 0430  vancomycin (VANCOCIN) IVPB 1000 mg/200 mL premix        1,000 mg 200 mL/hr over 60 Minutes Intravenous  Once 11/13/20 0418 11/13/20 0629      Assessment/Plan Hx cholecystostomy tube 09/2020, presented with dysfunctional tube with purulence  Chronic cholecystitis S/p laparoscopic cholecystectomy 11/15/20 Dr. Sheliah Hatch - POD#1, AFVSS - pain controlled on PO norco  - mobilize/OOB, therapies per primary team -stable for discharge from a general surgery perspective. Our office is arranging follow up in 2-3 weeks. Pain Rx provided.    LOS: 0 days  Obie Dredge, PA-C [facility] Please see Amion for pager number during day hours 7:00am-4:30pm

## 2020-11-15 NOTE — Discharge Instructions (Signed)
[facility] LAPAROSCOPIC SURGERY: POST OP INSTRUCTIONS Always review your discharge instruction sheet given to you by the facility where your surgery was performed. IF YOU HAVE DISABILITY OR FAMILY LEAVE FORMS, YOU MUST BRING THEM TO THE OFFICE FOR PROCESSING.   DO NOT GIVE THEM TO YOUR DOCTOR.  PAIN CONTROL  First take acetaminophen (Tylenol) AND/or ibuprofen (Advil) to control your pain after surgery.  Follow directions on package.  Taking acetaminophen (Tylenol) and/or ibuprofen (Advil) regularly after surgery will help to control your pain and lower the amount of prescription pain medication you may need.  You should not take more than 3,000 mg (3 grams) of acetaminophen (Tylenol) in 24 hours.  You should not take ibuprofen (Advil), aleve, motrin, naprosyn or other NSAIDS if you have a history of stomach ulcers or chronic kidney disease.  A prescription for pain medication may be given to you upon discharge.  Take your pain medication as prescribed, if you still have uncontrolled pain after taking acetaminophen (Tylenol) or ibuprofen (Advil). Use ice packs to help control pain. If you need a refill on your pain medication, please contact your pharmacy.  They will contact our office to request authorization. Prescriptions will not be filled after 5pm or on week-ends.  HOME MEDICATIONS Take your usually prescribed medications unless otherwise directed.  DIET You should follow a light diet the first few days after arrival home.  Be sure to include lots of fluids daily. Avoid fatty, fried foods.   CONSTIPATION It is common to experience some constipation after surgery and if you are taking pain medication.  Increasing fluid intake and taking a stool softener (such as Colace) will usually help or prevent this problem from occurring.  A mild laxative (Milk of Magnesia or Miralax) should be taken according to package instructions if there are no bowel movements after 48  hours.  WOUND/INCISION CARE Most patients will experience some swelling and bruising in the area of the incisions.  Ice packs will help.  Swelling and bruising can take several days to resolve.  Unless discharge instructions indicate otherwise, follow guidelines below  STERI-STRIPS - you may remove your outer bandages 48 hours after surgery, and you may shower at that time.  You have steri-strips (small skin tapes) in place directly over the incision.  These strips should be left on the skin for 7-10 days.   DERMABOND/SKIN GLUE - you may shower in 24 hours.  The glue will flake off over the next 2-3 weeks. Any sutures or staples will be removed at the office during your follow-up visit.  ACTIVITIES You may resume regular (light) daily activities beginning the next day--such as daily self-care, walking, climbing stairs--gradually increasing activities as tolerated.  You may have sexual intercourse when it is comfortable.  Refrain from any heavy lifting or straining until approved by your doctor. You may drive when you are no longer taking prescription pain medication, you can comfortably wear a seatbelt, and you can safely maneuver your car and apply brakes.  FOLLOW-UP You should see your doctor in the office for a follow-up appointment approximately 2-3 weeks after your surgery.  You should have been given your post-op/follow-up appointment when your surgery was scheduled.  If you did not receive a post-op/follow-up appointment, make sure that you call for this appointment within a day or two after you arrive home to insure a convenient appointment time.   WHEN TO CALL YOUR DOCTOR: Fever over 101.0 Inability to urinate Continued bleeding from incision.   Increased pain, redness, or drainage from the incision. Increasing abdominal pain  The clinic staff is available to answer your questions during regular business hours.  Please don't hesitate to call and ask to speak to one of the nurses for  clinical concerns.  If you have a medical emergency, go to the nearest emergency room or call 911.  A surgeon from [facility] is always on call at the hospital. [address], [address], [place], [place]  [address] ? [address] [address], [place], [place]   [address] [phone] ? [phone] ? FAX [phone] Web site: [email or web address]  
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
## 2020-11-15 NOTE — Progress Notes (Signed)
Heather [name] to be D/C'd  per MD order.  Discussed with the patient and all questions fully answered.  VSS, Skin clean, dry and intact without evidence of skin break down, no evidence of skin tears noted.  IV catheter discontinued intact. Site without signs and symptoms of complications. Dressing and pressure applied.  An After Visit Summary was printed and given to the patient.   D/c education completed with patient/family including follow up instructions, medication list, d/c activities limitations if indicated, with other d/c instructions as indicated by MD - patient able to verbalize understanding, all questions fully answered.   Patient instructed to return to ED, call 911, or call MD for any changes in condition.   Patient to be escorted via WC, and D/C home via private auto with daughter Elease Hashimoto.

## 2020-11-15 NOTE — TOC Initial Note (Signed)
Transition of Care [facility]) - Initial/Assessment Note    Patient Details  Name: Heather [name] MRN: [id number] Date of Birth: 19-May-1945  [facility] CM/SW Contact:    Kingsley Plan, RN Phone Number: 11/15/2020, 2:16 PM  Clinical Narrative:                 Patient from home with daughter Elease Hashimoto . NCM spoke to patient and daughter Elease Hashimoto at bedside. Discussed home health PT , both in agreement, confirmed face sheet information. Provided choice. Patient has had [facility] in past and would like them again.    Stacie with [facility] accepted referral for HHPT. Will need HHPT orders and face to face.  Expected Discharge Plan: Home w Home Health Services Barriers to Discharge: No Barriers Identified   Patient Goals and CMS Choice Patient states their goals for this hospitalization and ongoing recovery are:: to return to home CMS Medicare.gov Compare Post Acute Care list provided to:: Patient Choice offered to / list presented to : Patient, Adult Children  Expected Discharge Plan and Services Expected Discharge Plan: Home w Home Health Services   Discharge Planning Services: CM Consult Post Acute Care Choice: Home Health Living arrangements for the past 2 months: Single Family Home Expected Discharge Date: 11/15/20               DME Arranged: N/A         HH Arranged: PT HH Agency: [facility] Date HH Agency Contacted: 11/15/20 Time HH Agency Contacted: 1416 Representative spoke with at [facility] Agency: Stacie  Prior Living Arrangements/Services Living arrangements for the past 2 months: Single Family Home Lives with:: Adult Children Patient language and need for interpreter reviewed:: Yes        Need for Family Participation in Patient Care: Yes (Comment) Care giver support system in place?: Yes (comment) Current home services: DME Criminal Activity/Legal Involvement Pertinent to Current Situation/Hospitalization: No - Comment as  needed  Activities of Daily Living      Permission Sought/Granted   Permission granted to share information with : Yes, Verbal Permission Granted  Share Information with NAME: daughter Elease Hashimoto [phone]  Permission granted to share info w AGENCY: [facility]        Emotional Assessment Appearance:: Appears stated age Attitude/Demeanor/Rapport: Engaged Affect (typically observed): Accepting Orientation: : Oriented to Situation, Oriented to  Time, Oriented to Place, Oriented to Self Alcohol / Substance Use: Not Applicable Psych Involvement: No (comment)  Admission diagnosis:  Cholecystitis, acute [K81.0] Wound infection [T14.8XXA, L08.9] Wound infection after surgery [T81.49XA] Cellulitis of other specified site [L03.818] Patient Active Problem List   Diagnosis Date Noted   Acute cholecystitis 11/13/2020   Wound infection 11/13/2020   Chronic diastolic CHF (congestive heart failure) (HCC) 11/13/2020   Wound infection after surgery 11/13/2020   Diarrhea 10/28/2020   NSTEMI (non-ST elevated myocardial infarction) (HCC) 10/28/2020   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   Closed bicondylar fracture of distal femur, left, with routine healing, subsequent encounter 10/19/2020   Closed fracture of distal end of femur, unspecified fracture morphology, sequela 10/18/2020   Malnutrition of moderate degree 10/12/2020   Hypotension 10/09/2020   Femur fracture (HCC) 10/09/2020   Prolonged QT interval 10/09/2020   Abnormal ECG 10/09/2020   AKI (acute kidney injury) (HCC) 10/09/2020   Hyperkalemia 10/09/2020   Elevated troponin 10/09/2020   Leucocytosis 10/09/2020   Burst fracture of lumbar vertebra (HCC) 05/14/2020   Chronic cough 12/29/2019   Essential hypertension 12/29/2019   Pneumonia  due to COVID-19 virus 12/29/2019   PCP:  Janece Canterbury, MD Pharmacy:   [facility] [address] [place] - [address] [place]  [address] Phone: [phone] Fax: [phone]  [facility] - [place], [place] - [address] [place] [address] Phone: [phone] Fax: [phone]     Social Determinants of Health (SDOH) Interventions    Readmission Risk Interventions No flowsheet data found.

## 2020-11-15 NOTE — Progress Notes (Signed)
Brief cardiology note: Now post lap chole. Cardiology consulted for preoperative evaluation. As she is now through surgery, cardiology will sign off but we are available for any questions or concerns.  Jodelle Red, MD, PhD, [facility] [place]  [facility]    Heart & Vascular at [facility] at [facility] [address], [address] [place], [place] [address] [phone]
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
## 2020-11-15 NOTE — Discharge Summary (Signed)
Physician Discharge Summary  Heather [name] CHY:[id number] DOB: 08-Jan-1946 DOA: 11/12/2020  PCP: Janece Canterbury, MD  Admit date: 11/12/2020 Discharge date: 11/15/2020  Admitted From: Home  Disposition:  Home   Recommendations for Outpatient Follow-up:  Follow up with PCP in 1-2 weeks Please obtain BMP/CBC in one week Needs to follow up with Sx post cholecystectomy     Discharge Condition: Stable.  CODE STATUS: Full code Diet recommendation: Heart Healthy  Brief/Interim Summary: 75 year old with past medical history significant for L3 burst fracture  s/p repair March 2022, distal femoral fracture with ORIF Eliquis 2022 June she was diagnosed with cholecystitis.  She was not a surgical candidate at that time and a cystostomy catheter to gallbladder was placed.  She received antibiotics for sepsis.  She presents with 2-day history of redness around the catheter site along with purulent drainage.   Evaluation in the ED CT abdomen was negative for abscess.  General surgery was consulted, plan is for Sx 9/26   1-History of cholecystitis status post cholecystostomy tube placement August 2022.  Wound infection around catheter site with drainage purulent secretion: -Patient was a started on ceftriaxone and Flagyl. Plan to discontinue antibiotics at discharge.  -Underwent  cholecystectomy 9/26   -Clear by cardiology for surgery.   -patient doing well post surgery. She is eating breakfast/ she was evaluated by surgery and plan is to discharge home today.  -Needs to follow up with surgery post op.   2-Chronic diastolic dysfunction grade 2: Resume Entresto today and spironolactone on Thursday.   3-Hypertension: Continue with metoprolol.   Discharge Diagnoses:  Active Problems:   Essential hypertension   Acute cholecystitis   Wound infection following procedure   Chronic diastolic CHF (congestive heart failure) (HCC)   Wound infection after surgery    Discharge  Instructions  Discharge Instructions     Diet - low sodium heart healthy   Complete by: As directed    Discharge wound care:   Complete by: As directed    See discharge recommendation   Increase activity slowly   Complete by: As directed       Allergies as of 11/15/2020       Reactions   Augmentin [amoxicillin-pot Clavulanate] Itching, Nausea And Vomiting   Has tolerated Ancef and Keflex        Medication List     STOP taking these medications    Eliquis 2.5 MG Tabs tablet Generic drug: apixaban   oxyCODONE 5 MG immediate release tablet Commonly known as: Oxy IR/ROXICODONE   Proctozone-HC 2.5 % rectal cream Generic drug: hydrocortisone       TAKE these medications    albuterol 108 (90 Base) MCG/ACT inhaler Commonly known as: VENTOLIN HFA Inhale 2 puffs into the lungs every 6 (six) hours as needed for wheezing or shortness of breath.   aspirin 81 MG EC tablet Take 1 tablet (81 mg total) by mouth daily. Swallow whole.   buPROPion 300 MG 24 hr tablet Commonly known as: WELLBUTRIN XL Take 300 mg by mouth daily.   dicyclomine 20 MG tablet Commonly known as: BENTYL Take 20 mg by mouth 2 (two) times daily.   diphenoxylate-atropine 2.5-0.025 MG tablet Commonly known as: LOMOTIL Take 2 tablets by mouth 4 (four) times daily as needed for diarrhea or loose stools.   Entresto 24-26 MG Generic drug: sacubitril-valsartan Take 1 tablet by mouth 2 (two) times daily.   fexofenadine 180 MG tablet Commonly known as: ALLEGRA Take 180 mg by mouth every morning.  fluticasone 50 MCG/ACT nasal spray Commonly known as: FLONASE Place 1 spray into both nostrils daily as needed for allergies or rhinitis.   gabapentin 300 MG capsule Commonly known as: NEURONTIN Take 300-600 mg by mouth See admin instructions. Take 1 capsule (300 mg) by mouth in the morning & take 2 capsules (600 mg) at bedtime   HYDROcodone-acetaminophen 5-325 MG tablet Commonly known as: Norco Take  1 tablet by mouth every 6 (six) hours as needed for moderate pain or severe pain. What changed:  how much to take reasons to take this   metoprolol succinate 50 MG 24 hr tablet Commonly known as: TOPROL-XL Take 1 tablet (50 mg total) by mouth daily. Take with or immediately following a meal.   multivitamin with minerals Tabs tablet Take 1 tablet by mouth daily.   rosuvastatin 10 MG tablet Commonly known as: CRESTOR Take 10 mg by mouth daily.   spironolactone 25 MG tablet Commonly known as: ALDACTONE Take one-half tablet (12.5 mg total) by mouth daily. Start taking on: November 17, 2020 What changed: These instructions start on November 17, 2020. If you are unsure what to do until then, ask your doctor or other care provider.   traZODone 50 MG tablet Commonly known as: DESYREL Take one-half tablet (25 mg total) by mouth at bedtime as needed for sleep. What changed: when to take this               Discharge Care Instructions  (From admission, onward)           Start     Ordered   11/15/20 0000  Discharge wound care:       Comments: See discharge recommendation   11/15/20 1205            Follow-up Information     [facility].   Specialty: General Surgery Why: our office is scheduling you for post-operative follow up, please call to confirm appointment date/time. Contact information: [address] [place] [address] [phone]                Allergies  Allergen Reactions   Augmentin [Amoxicillin-Pot Clavulanate] Itching and Nausea And Vomiting    Has tolerated Ancef and Keflex    Consultations: General Sx    Procedures/Studies: DG Abd 1 View  Result Date: 10/20/2020 CLINICAL DATA:  Ileus EXAM: ABDOMEN - 1 VIEW COMPARISON:  10/18/2020 FINDINGS: Nonobstructive pattern of bowel gas, with gas present to the rectum. No free air in the abdomen. Pigtail catheter projects in the vicinity of the right  kidney. Posterior lumbar fusion. IMPRESSION: Nonobstructive pattern of bowel gas, with gas present to the rectum. No free air in the abdomen. Electronically Signed   By: Lauralyn Primes M.D.   On: 10/20/2020 10:39   DG Abd 1 View  Result Date: 10/18/2020 CLINICAL DATA:  Abdomen pain EXAM: ABDOMEN - 1 VIEW COMPARISON:  None. FINDINGS: Mild increased small and large bowel gas without obstructive pattern. Cholecystostomy tube in the right upper quadrant. Surgical hardware in lumbar spine IMPRESSION: Mild air-filled small and large bowel suggestive mild ileus. Electronically Signed   By: Jasmine Pang M.D.   On: 10/18/2020 22:29   CT ABDOMEN PELVIS W CONTRAST  Result Date: 11/13/2020 CLINICAL DATA:  Acute nonlocalized abdominal pain. Infected cholecystostomy catheter. Right lower quadrant abdominal pain. EXAM: CT ABDOMEN AND PELVIS WITH CONTRAST TECHNIQUE: Multidetector CT imaging of the abdomen and pelvis was performed using the standard protocol following bolus  administration of intravenous contrast. CONTRAST:  OMNIPAQUE IOHEXOL 300 MG/ML  SOLN COMPARISON:  10/26/2020 FINDINGS: Lower chest: Visualized lung bases are clear. Mild coronary artery calcification. Global cardiac size within normal limits. Hepatobiliary: Percutaneous drainage catheter is seen looped within the gallbladder fundus, similar to that noted on prior examination. Minimal pericatheter inflammatory change adjacent to the gallbladder fossa. No discrete perihepatic or subcutaneous fluid collection identified. The liver is unremarkable. No intra or extrahepatic biliary ductal dilation. Pancreas: Unremarkable Spleen: Unremarkable Adrenals/Urinary Tract: The adrenal glands are unremarkable. The kidneys are normal in size and position. Stable 10 mm exophytic low-attenuation lesion arises from the interpolar region of the right kidney likely representing a small cortical cyst. The kidneys are otherwise unremarkable. The bladder is unremarkable.  Stomach/Bowel: Stomach, small bowel, and large bowel are unremarkable. Appendix absent. No free intraperitoneal gas or fluid. Vascular/Lymphatic: Mild aortoiliac atherosclerotic calcification. No aortic aneurysm. No pathologic adenopathy within the abdomen and pelvis. Reproductive: Uterus and bilateral adnexa are unremarkable. Other: No abdominal wall hernia.  Rectum unremarkable. Musculoskeletal: 6 non rib bearing segments of the lumbar spine. Stable compression deformity with retropulsion of L4 with approximately 50% loss of height. Spinal fusion with instrumentation and resection of the spinous process of L4 has been performed of L2-L6. No acute bone abnormality. No lytic or blastic bone lesion. IMPRESSION: Percutaneous cholecystostomy catheter in appropriate position. Trace pericholecystic inflammatory change along the tract of the catheter. However, no discrete pericatheter, perihepatic, or subcutaneous fluid collection identified. Aortic Atherosclerosis (ICD10-I70.0). Electronically Signed   By: Helyn Numbers M.D.   On: 11/13/2020 03:54   CT ABDOMEN PELVIS W CONTRAST  Result Date: 10/26/2020 CLINICAL DATA:  Cholecystitis, status post percutaneous cholecystostomy, increasing WBC EXAM: CT ABDOMEN AND PELVIS WITH CONTRAST TECHNIQUE: Multidetector CT imaging of the abdomen and pelvis was performed using the standard protocol following bolus administration of intravenous contrast. CONTRAST:  62mL OMNIPAQUE IOHEXOL 350 MG/ML SOLN, additional oral enteric contrast COMPARISON:  Abdominal ultrasound, 10/12/2020, ultrasound-guided cholecystostomy placement, 10/12/2020 FINDINGS: Lower chest: No acute abnormality. Left coronary artery calcifications. Hepatobiliary: No solid liver abnormality is seen. Percutaneous cholecystostomy tube is position with formed pigtail in the gallbladder fundus (series 3, image 34). Mild gallbladder wall thickening and small volume adjacent pericholecystic fluid. The common bile duct is  mildly dilated to the ampulla, maximum caliber 0.9 cm centrally. No obvious obstructing calculus or other lesion. Pancreas: Unremarkable. No pancreatic ductal dilatation or surrounding inflammatory changes. Spleen: Normal in size without significant abnormality. Adrenals/Urinary Tract: Adrenal glands are unremarkable. Kidneys are normal, without renal calculi, solid lesion, or hydronephrosis. Bladder is unremarkable. Stomach/Bowel: Stomach is within normal limits. Appendix is not clearly visualized and may be surgically absent. No evidence of bowel wall thickening, distention, or inflammatory changes. Occasional sigmoid diverticula. Vascular/Lymphatic: Aortic atherosclerosis. No enlarged abdominal or pelvic lymph nodes. Reproductive: No mass or other significant abnormality. Other: No abdominal wall hernia or abnormality. No abdominopelvic ascites. Musculoskeletal: Bridging fusion of L1 through L5 about a sclerotic burst type fracture of the L3 vertebral body (series 7, image 56). IMPRESSION: 1. Percutaneous cholecystostomy tube is positioned with formed pigtail in the gallbladder fundus. Mild gallbladder wall thickening and small volume adjacent pericholecystic fluid, findings consistent with acute cholecystitis. 2. The common bile duct is mildly dilated to the ampulla, maximum caliber 0.9 cm centrally. No obvious obstructing calculus or other lesion. Consider ERCP/MRCP to further evaluate for centrally obstructing etiology, particularly given that gallstones were reported on prior ultrasound but radiopaque on current CT examination. 3. Bridging  fusion of L1 through L5 about a sclerotic burst type fracture of the L3 vertebral body. 4. Coronary artery disease. Aortic Atherosclerosis (ICD10-I70.0). Electronically Signed   By: Lauralyn Primes M.D.   On: 10/26/2020 14:03   DG FEMUR MIN 2 VIEWS LEFT  Result Date: 10/27/2020 CLINICAL DATA:  Fracture EXAM: LEFT FEMUR 2 VIEWS COMPARISON:  10/13/2020 FINDINGS:  Redemonstrated, comminuted fractures of the distal left femoral metadiaphysis, status post plate and screw fixation of the lateral distal left femur and total knee arthroplasty. No change in fracture fragment alignment. Radiographic findings are not significantly changed compared to prior examination dated 10/13/2020. Soft tissues are unremarkable. IMPRESSION: Redemonstrated, comminuted fractures of the distal left femoral metadiaphysis, status post plate and screw fixation of the lateral distal left femur and total knee arthroplasty. No change in fracture fragment alignment. Electronically Signed   By: Lauralyn Primes M.D.   On: 10/27/2020 10:25   VAS [place] LOWER EXTREMITY VENOUS (DVT)  Result Date: 10/19/2020  Lower Venous DVT Study Patient Name:  EILENE VOIGT  Date of Exam:   10/19/2020 Medical Rec #: [id number]               Accession #:    [id number] Date of Birth: [birth year]              Patient Gender: F Patient Age:   57 years Exam Location:  [facility] Procedure:      VAS [place] LOWER EXTREMITY VENOUS (DVT) Referring Phys: PAMELA LOVE --------------------------------------------------------------------------------  Indications: Immobility.  Risk Factors: Surgery. Limitations: Poor ultrasound/tissue interface and patient positioning. Comparison Study: No prior studies. Performing Technologist: Chanda Busing RVT  Examination Guidelines: A complete evaluation includes B-mode imaging, spectral Doppler, color Doppler, and power Doppler as needed of all accessible portions of each vessel. Bilateral testing is considered an integral part of a complete examination. Limited examinations for reoccurring indications may be performed as noted. The reflux portion of the exam is performed with the patient in reverse Trendelenburg.  +---------+---------------+---------+-----------+----------+--------------+ RIGHT    CompressibilityPhasicitySpontaneityPropertiesThrombus Aging  +---------+---------------+---------+-----------+----------+--------------+ CFV      Full           Yes      Yes                                 +---------+---------------+---------+-----------+----------+--------------+ SFJ      Full                                                        +---------+---------------+---------+-----------+----------+--------------+ FV Prox  Full                                                        +---------+---------------+---------+-----------+----------+--------------+ FV Mid   Full                                                        +---------+---------------+---------+-----------+----------+--------------+ FV DistalFull                                                        +---------+---------------+---------+-----------+----------+--------------+  PFV      Full                                                        +---------+---------------+---------+-----------+----------+--------------+ POP      Full           Yes      Yes                                 +---------+---------------+---------+-----------+----------+--------------+ PTV      Full                                                        +---------+---------------+---------+-----------+----------+--------------+ PERO     Full                                                        +---------+---------------+---------+-----------+----------+--------------+   +---------+---------------+---------+-----------+----------+-------------------+ LEFT     CompressibilityPhasicitySpontaneityPropertiesThrombus Aging      +---------+---------------+---------+-----------+----------+-------------------+ CFV      Full           Yes      Yes                                      +---------+---------------+---------+-----------+----------+-------------------+ SFJ      Full                                                              +---------+---------------+---------+-----------+----------+-------------------+ FV Prox  Full                                                             +---------+---------------+---------+-----------+----------+-------------------+ FV Mid   Full                                                             +---------+---------------+---------+-----------+----------+-------------------+ FV DistalFull                                                             +---------+---------------+---------+-----------+----------+-------------------+ PFV      Full                                                             +---------+---------------+---------+-----------+----------+-------------------+  POP      Full           Yes      Yes                                      +---------+---------------+---------+-----------+----------+-------------------+ PTV      Full                                                             +---------+---------------+---------+-----------+----------+-------------------+ PERO                                                  Not well visualized +---------+---------------+---------+-----------+----------+-------------------+     Summary: RIGHT: - There is no evidence of deep vein thrombosis in the lower extremity.  - No cystic structure found in the popliteal fossa.  LEFT: - There is no evidence of deep vein thrombosis in the lower extremity. However, portions of this examination were limited- see technologist comments above.  - No cystic structure found in the popliteal fossa.  *See table(s) above for measurements and observations. Electronically signed by Coral Else MD on 10/19/2020 at 6:28:49 PM.    Final      Subjective: She is feeling well, passing gas.  Pain controlled.   Discharge Exam: Vitals:   11/15/20 0501 11/15/20 0746  BP: (!) 148/80 116/68  Pulse: 82 80  Resp: 16 16  Temp: 98.8 F (37.1 C) 99.2 F (37.3 C)  SpO2:  94% 94%     General: Pt is alert, awake, not in acute distress Cardiovascular: RRR, S1/S2 +, no rubs, no gallops Respiratory: CTA bilaterally, no wheezing, no rhonchi Abdominal: Soft, NT, ND, bowel sounds + small incisions CDI Extremities: no edema, no cyanosis    The results of significant diagnostics from this hospitalization (including imaging, microbiology, ancillary and laboratory) are listed below for reference.     Microbiology: Recent Results (from the past 240 hour(s))  Resp Panel by RT-PCR (Flu A&B, Covid) Nasopharyngeal Swab     Status: None   Collection Time: 11/13/20 12:59 AM   Specimen: Nasopharyngeal Swab; Nasopharyngeal(NP) swabs in vial transport medium  Result Value Ref Range Status   SARS Coronavirus 2 by RT PCR NEGATIVE NEGATIVE Final    Comment: (NOTE) SARS-CoV-2 target nucleic acids are NOT DETECTED.  The SARS-CoV-2 RNA is generally detectable in upper respiratory specimens during the acute phase of infection. The lowest concentration of SARS-CoV-2 viral copies this assay can detect is 138 copies/mL. A negative result does not preclude SARS-Cov-2 infection and should not be used as the sole basis for treatment or other patient management decisions. A negative result may occur with  improper specimen collection/handling, submission of specimen other than nasopharyngeal swab, presence of viral mutation(s) within the areas targeted by this assay, and inadequate number of viral copies(<138 copies/mL). A negative result must be combined with clinical observations, patient history, and epidemiological information. The expected result is Negative.  Fact Sheet for Patients:  BloggerCourse.com  Fact Sheet for Healthcare Providers:  SeriousBroker.it  This test is no t yet approved or cleared  by the [place] and  has been authorized for detection and/or diagnosis of SARS-CoV-2 by FDA under an Emergency  Use Authorization (EUA). This EUA will remain  in effect (meaning this test can be used) for the duration of the COVID-19 declaration under Section 564(b)(1) of the Act, 21 U.S.C.section 360bbb-3(b)(1), unless the authorization is terminated  or revoked sooner.       Influenza A by PCR NEGATIVE NEGATIVE Final   Influenza B by PCR NEGATIVE NEGATIVE Final    Comment: (NOTE) The Xpert Xpress SARS-CoV-2/FLU/RSV plus assay is intended as an aid in the diagnosis of influenza from Nasopharyngeal swab specimens and should not be used as a sole basis for treatment. Nasal washings and aspirates are unacceptable for Xpert Xpress SARS-CoV-2/FLU/RSV testing.  Fact Sheet for Patients: BloggerCourse.com  Fact Sheet for Healthcare Providers: SeriousBroker.it  This test is not yet approved or cleared by the [place] FDA and has been authorized for detection and/or diagnosis of SARS-CoV-2 by FDA under an Emergency Use Authorization (EUA). This EUA will remain in effect (meaning this test can be used) for the duration of the COVID-19 declaration under Section 564(b)(1) of the Act, 21 U.S.C. section 360bbb-3(b)(1), unless the authorization is terminated or revoked.  Performed at [facility], [address], [place], [place] [address]      Labs: BNP (last 3 results) Recent Labs    10/09/20 1925  BNP 417.2*   Basic Metabolic Panel: Recent Labs  Lab 11/12/20 1934 11/13/20 0732 11/15/20 0327  NA 140 138 134*  K 4.4 4.1 4.0  CL 108 109 103  CO2 24 24 24   GLUCOSE 115* 108* 82  BUN 9 7* 8  CREATININE 0.77 0.77 0.63  CALCIUM 8.6* 8.2* 8.6*   Liver Function Tests: Recent Labs  Lab 11/12/20 1934  AST 23  ALT 20  ALKPHOS 125  BILITOT 0.5  PROT 6.3*  ALBUMIN 3.1*   Recent Labs  Lab 11/12/20 1934  LIPASE 37   No results for input(s): AMMONIA in the last 168 hours. CBC: Recent Labs  Lab 11/12/20 1934  11/14/20 0609 11/15/20 0327  WBC 9.5 6.4 10.6*  NEUTROABS 6.0  --   --   HGB 12.4 11.6* 12.6  HCT 40.3 36.5 39.1  MCV 88.2 86.7 84.6  PLT 278 247 288   Cardiac Enzymes: No results for input(s): CKTOTAL, CKMB, CKMBINDEX, TROPONINI in the last 168 hours. BNP: Invalid input(s): POCBNP CBG: No results for input(s): GLUCAP in the last 168 hours. D-Dimer No results for input(s): DDIMER in the last 72 hours. Hgb A1c No results for input(s): HGBA1C in the last 72 hours. Lipid Profile No results for input(s): CHOL, HDL, LDLCALC, TRIG, CHOLHDL, LDLDIRECT in the last 72 hours. Thyroid function studies No results for input(s): TSH, T4TOTAL, T3FREE, THYROIDAB in the last 72 hours.  Invalid input(s): FREET3 Anemia work up No results for input(s): VITAMINB12, FOLATE, FERRITIN, TIBC, IRON, RETICCTPCT in the last 72 hours. Urinalysis    Component Value Date/Time   COLORURINE YELLOW 10/26/2020 0700   APPEARANCEUR HAZY (A) 10/26/2020 0700   LABSPEC 1.015 10/26/2020 0700   PHURINE 5.0 10/26/2020 0700   GLUCOSEU NEGATIVE 10/26/2020 0700   HGBUR NEGATIVE 10/26/2020 0700   BILIRUBINUR NEGATIVE 10/26/2020 0700   KETONESUR NEGATIVE 10/26/2020 0700   PROTEINUR NEGATIVE 10/26/2020 0700   NITRITE NEGATIVE 10/26/2020 0700   LEUKOCYTESUR TRACE (A) 10/26/2020 0700   Sepsis Labs Invalid input(s): PROCALCITONIN,  WBC,  LACTICIDVEN Microbiology Recent Results (from  the past 240 hour(s))  Resp Panel by RT-PCR (Flu A&B, Covid) Nasopharyngeal Swab     Status: None   Collection Time: 11/13/20 12:59 AM   Specimen: Nasopharyngeal Swab; Nasopharyngeal(NP) swabs in vial transport medium  Result Value Ref Range Status   SARS Coronavirus 2 by RT PCR NEGATIVE NEGATIVE Final    Comment: (NOTE) SARS-CoV-2 target nucleic acids are NOT DETECTED.  The SARS-CoV-2 RNA is generally detectable in upper respiratory specimens during the acute phase of infection. The lowest concentration of SARS-CoV-2 viral copies  this assay can detect is 138 copies/mL. A negative result does not preclude SARS-Cov-2 infection and should not be used as the sole basis for treatment or other patient management decisions. A negative result may occur with  improper specimen collection/handling, submission of specimen other than nasopharyngeal swab, presence of viral mutation(s) within the areas targeted by this assay, and inadequate number of viral copies(<138 copies/mL). A negative result must be combined with clinical observations, patient history, and epidemiological information. The expected result is Negative.  Fact Sheet for Patients:  BloggerCourse.com  Fact Sheet for Healthcare Providers:  SeriousBroker.it  This test is no t yet approved or cleared by the [place] FDA and  has been authorized for detection and/or diagnosis of SARS-CoV-2 by FDA under an Emergency Use Authorization (EUA). This EUA will remain  in effect (meaning this test can be used) for the duration of the COVID-19 declaration under Section 564(b)(1) of the Act, 21 U.S.C.section 360bbb-3(b)(1), unless the authorization is terminated  or revoked sooner.       Influenza A by PCR NEGATIVE NEGATIVE Final   Influenza B by PCR NEGATIVE NEGATIVE Final    Comment: (NOTE) The Xpert Xpress SARS-CoV-2/FLU/RSV plus assay is intended as an aid in the diagnosis of influenza from Nasopharyngeal swab specimens and should not be used as a sole basis for treatment. Nasal washings and aspirates are unacceptable for Xpert Xpress SARS-CoV-2/FLU/RSV testing.  Fact Sheet for Patients: BloggerCourse.com  Fact Sheet for Healthcare Providers: SeriousBroker.it  This test is not yet approved or cleared by the [place] FDA and has been authorized for detection and/or diagnosis of SARS-CoV-2 by FDA under an Emergency Use Authorization (EUA). This EUA  will remain in effect (meaning this test can be used) for the duration of the COVID-19 declaration under Section 564(b)(1) of the Act, 21 U.S.C. section 360bbb-3(b)(1), unless the authorization is terminated or revoked.  Performed at [facility], [address], [place], [place] [address]      Time coordinating discharge: 40 minutes  SIGNED:   Alba Cory, MD  [facility]

## 2020-11-16 LAB — SURGICAL PATHOLOGY

## 2020-12-01 NOTE — Progress Notes (Signed)
Cardiology Office Note:    Date:  12/01/2020   ID:  Heather [name], Heather [name] [birth year], MRN [id number]  PCP:  Janece Canterbury, MD   [facility] Providers Cardiologist:  Armanda Magic, MD     Referring MD: Janece Canterbury, MD   No chief complaint on file. Type II demand ischemia  History of Present Illness:    Heather [name] is a 75 y.o. 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 Today in clinic she is doing well. She was on DVT prophylaxis with eliquis now completed. Prior to being inpatient she was on lisinopril.She comes in with her daughter. She's been doing well. She denies angina, dyspnea on exertion, lower extremity edema, PND or orthopnea.    Past Medical History:  Diagnosis Date   Anxiety    Closed right ankle fracture    Complication of anesthesia    Depression    Headache    Hyperlipidemia    Hypertension    IBS (irritable bowel syndrome)    PONV (postoperative nausea and vomiting)    Seasonal allergies     Past Surgical History:  Procedure Laterality Date   APPENDECTOMY     CHOLECYSTECTOMY N/A 11/14/2020   Procedure: LAPAROSCOPIC CHOLECYSTECTOMY;  Surgeon: Kinsinger, De Blanch, MD;  Location: [facility] OR;  Service: General;  Laterality: N/A;   IR PERC CHOLECYSTOSTOMY  10/12/2020   JOINT REPLACEMENT Bilateral    TKA   LEFT HEART CATH AND CORONARY ANGIOGRAPHY N/A 10/11/2020   Procedure: LEFT HEART CATH AND CORONARY ANGIOGRAPHY;  Surgeon: Swaziland, Peter M, MD;  Location: [facility] INVASIVE CV LAB;  Service: Cardiovascular;  Laterality: N/A;   LUMBAR LAMINECTOMY/DECOMPRESSION MICRODISCECTOMY N/A 05/15/2020   Procedure: LUMBAR  LAMINECTOMY/DECOMPRESSION OF Lumbar three;  Surgeon: Maeola Harman, MD;  Location: [facility] OR;  Service: Neurosurgery;  Laterality: N/A;   LUMBAR PERCUTANEOUS PEDICLE SCREW 2 LEVEL N/A 05/15/2020   Procedure: LUMBAR PERCUTANEOUS SCREW, Lumbar one - Lumbar two, Lumbar four - Lumbar five;  Surgeon: Maeola Harman, MD;  Location: [facility] OR;  Service: Neurosurgery;  Laterality: N/A;   ORIF ANKLE FRACTURE Right 06/05/2018   Procedure: OPEN REDUCTION INTERNAL FIXATION (ORIF) ANKLE FRACTURE;  Surgeon: Sheral Apley, MD;  Location: [facility];  Service: Orthopedics;  Laterality: Right;   ORIF FEMUR FRACTURE Left 10/13/2020   Procedure: OPEN REDUCTION INTERNAL FIXATION (ORIF) DISTAL FEMUR FRACTURE;  Surgeon: Roby Lofts, MD;  Location: [facility] OR;  Service: Orthopedics;  Laterality: Left;    Current Medications: No outpatient medications have been marked as taking for the 12/02/20 encounter (Appointment) with Maisie Fus, MD.     Allergies:   Augmentin [amoxicillin-pot clavulanate]   Social History   Socioeconomic History   Marital status: Single    Spouse name: Not on file   Number of children: Not on file   Years of education: Not on file   Highest education level: Not on file  Occupational History   Not on file  Tobacco Use   Smoking status: Former   Smokeless tobacco: Never  Substance and Sexual Activity   Alcohol use: Not Currently    Comment: quit 22 yrs ago   Drug use: Never  Sexual activity: Not on file  Other Topics Concern   Not on file  Social History Narrative   ** Merged History Encounter **       Social Determinants of Health   Financial Resource Strain: Not on file  Food Insecurity: Not on file  Transportation Needs: Not on file  Physical Activity: Not on file  Stress: Not on file  Social Connections: Not on file     Family History: The patient's family history includes Hypertension in an other family member.  ROS:   Please see the history of  present illness.     All other systems reviewed and are negative.  EKGs/Labs/Other Studies Reviewed:    The following studies were reviewed today:   EKG:   10/14/2020- NSR, LAD  TTE 10/10/2020 EF 50-55% /RV function No valvular disease No ascending aortic aneurysm  LHC 10/11/2020: Non obstructive dx Prox LAD 30% Mid RCA 20%   Recent Labs: 10/09/2020: B Natriuretic Peptide 417.2; TSH 3.324 10/18/2020: Magnesium 2.0 11/12/2020: ALT 20 11/15/2020: BUN 8; Creatinine, Ser 0.63; Hemoglobin 12.6; Platelets 288; Potassium 4.0; Sodium 134  Recent Lipid Panel No results found for: CHOL, TRIG, HDL, CHOLHDL, VLDL, LDLCALC, LDLDIRECT   Risk Assessment/Calculations:           Physical Exam:    VS:  There were no vitals taken for this visit.    Wt Readings from Last 3 Encounters:  11/14/20 153 lb (69.4 kg)  10/28/20 152 lb 5.4 oz (69.1 kg)  10/10/20 128 lb 4.9 oz (58.2 kg)     GEN: Sitting in a wheel chair, Well nourished, well developed in no acute distress HEENT: Normal NECK: No JVD; No carotid bruits LYMPHATICS: No lymphadenopathy CARDIAC: RRR, no murmurs, rubs, gallops Vasc: 2+ BL radial pulses ;radial site clean and dry RESPIRATORY:  Clear to auscultation without rales, wheezing or rhonchi  ABDOMEN: Soft, non-tender, non-distended MUSCULOSKELETAL:  No edema; No deformity  SKIN: Warm and dry NEUROLOGIC:  Alert and oriented x 3 PSYCHIATRIC:  Normal affect   ASSESSMENT:    #Type II Demand Ischemia: Mrs Goodspeed had demand ischemia. Her LHC showed mild non obstructive disease. Her right radial site is normal. Renal function 11/15/2020 is normal.  #C/f takotsubo: spironolactone (normal Crt, K), entresto , continue metoprolol XL 50 mg. Will repeat echo  PLAN:    In order of problems listed above:  Stop eliquis for DVT prophylaxis Refilled metop XL and spironolactone Repeat TTE next month, 3 months post prior Follow up in 6 months     Medication Adjustments/Labs and  Tests Ordered: Current medicines are reviewed at length with the patient today.  Concerns regarding medicines are outlined above.    Signed, Maisie Fus, MD  12/01/2020 11:08 AM    [facility]

## 2020-12-02 ENCOUNTER — Ambulatory Visit: Payer: Medicare HMO | Admitting: Internal Medicine

## 2020-12-02 ENCOUNTER — Encounter: Payer: Self-pay | Admitting: Internal Medicine

## 2020-12-02 ENCOUNTER — Other Ambulatory Visit: Payer: Self-pay

## 2020-12-02 VITALS — BP 116/75 | HR 74 | Ht 63.0 in | Wt 148.0 lb

## 2020-12-02 DIAGNOSIS — Z09 Encounter for follow-up examination after completed treatment for conditions other than malignant neoplasm: Secondary | ICD-10-CM

## 2020-12-02 DIAGNOSIS — I429 Cardiomyopathy, unspecified: Secondary | ICD-10-CM | POA: Diagnosis not present

## 2020-12-02 MED ORDER — SPIRONOLACTONE 25 MG PO TABS: 12.5000 mg | ORAL_TABLET | Freq: Every day | ORAL | 0 refills | Status: DC

## 2020-12-02 MED ORDER — METOPROLOL SUCCINATE ER 50 MG PO TB24: 50.0000 mg | ORAL_TABLET | Freq: Every day | ORAL | 1 refills | Status: DC

## 2020-12-02 NOTE — Patient Instructions (Signed)
Medication Instructions:  STOP TAKING ELIQUIS 2.   CONTINUE WITH OTHER MEDICATIONS  *If you need a refill on your cardiac medications before your next appointment, please call your pharmacy*   Testing/Procedures: Your physician has requested that you have an echocardiogram. Echocardiography is a painless test that uses sound waves to create images of your heart. It provides your doctor with information about the size and shape of your heart and how well your heart's chambers and valves are working. This procedure takes approximately one hour. There are no restrictions for this procedure. [address] SCHEDULE FOR NOVEMBER.   Follow-Up: At [facility], you and your health needs are our priority.  As part of our continuing mission to provide you with exceptional heart care, we have created designated Provider Care Teams.  These Care Teams include your primary Cardiologist (physician) and Advanced Practice Providers (APPs -  Physician Assistants and Nurse Practitioners) who all work together to provide you with the care you need, when you need it.  We recommend signing up for the patient portal called "MyChart".  Sign up information is provided on this After Visit Summary.  MyChart is used to connect with patients for Virtual Visits (Telemedicine).  Patients are able to view lab/test results, encounter notes, upcoming appointments, etc.  Non-urgent messages can be sent to your provider as well.   To learn more about what you can do with MyChart, go to [email or web address].    Your next appointment:   6 month(s)  The format for your next appointment:   In Person  Provider:   DR. Wyline Mood

## 2020-12-05 ENCOUNTER — Telehealth: Payer: Self-pay

## 2020-12-05 NOTE — Telephone Encounter (Signed)
NOTES SCANNED TO REFERRAL RJ 

## 2020-12-13 ENCOUNTER — Other Ambulatory Visit: Payer: Medicare HMO

## 2020-12-21 ENCOUNTER — Other Ambulatory Visit: Payer: Self-pay | Admitting: Surgery

## 2020-12-21 DIAGNOSIS — K819 Cholecystitis, unspecified: Secondary | ICD-10-CM

## 2020-12-30 ENCOUNTER — Ambulatory Visit (HOSPITAL_COMMUNITY): Payer: Medicare HMO | Attending: Cardiology

## 2020-12-30 ENCOUNTER — Other Ambulatory Visit: Payer: Self-pay

## 2020-12-30 DIAGNOSIS — I429 Cardiomyopathy, unspecified: Secondary | ICD-10-CM

## 2020-12-30 DIAGNOSIS — I5030 Unspecified diastolic (congestive) heart failure: Secondary | ICD-10-CM | POA: Diagnosis not present

## 2020-12-30 DIAGNOSIS — I10 Essential (primary) hypertension: Secondary | ICD-10-CM | POA: Insufficient documentation

## 2020-12-30 DIAGNOSIS — E785 Hyperlipidemia, unspecified: Secondary | ICD-10-CM | POA: Diagnosis not present

## 2020-12-30 LAB — ECHOCARDIOGRAM COMPLETE
Area-P 1/2: 3.53 cm2
S' Lateral: 2.8 cm

## 2021-01-02 DIAGNOSIS — L0390 Cellulitis, unspecified: Secondary | ICD-10-CM

## 2021-01-30 ENCOUNTER — Other Ambulatory Visit: Payer: Self-pay | Admitting: Internal Medicine

## 2021-01-30 DIAGNOSIS — I429 Cardiomyopathy, unspecified: Secondary | ICD-10-CM

## 2021-03-01 ENCOUNTER — Other Ambulatory Visit (HOSPITAL_COMMUNITY): Payer: Self-pay

## 2021-04-11 ENCOUNTER — Other Ambulatory Visit: Payer: Self-pay | Admitting: Neurosurgery

## 2021-04-11 DIAGNOSIS — S32030D Wedge compression fracture of third lumbar vertebra, subsequent encounter for fracture with routine healing: Secondary | ICD-10-CM

## 2021-05-03 ENCOUNTER — Ambulatory Visit
Admission: RE | Admit: 2021-05-03 | Discharge: 2021-05-03 | Disposition: A | Discharge status: Home / Self Care | Payer: Medicare HMO | Source: Ambulatory Visit | Attending: Neurosurgery | Admitting: Neurosurgery

## 2021-05-03 DIAGNOSIS — S32030D Wedge compression fracture of third lumbar vertebra, subsequent encounter for fracture with routine healing: Secondary | ICD-10-CM

## 2021-06-01 ENCOUNTER — Telehealth: Payer: Self-pay | Admitting: Internal Medicine

## 2021-06-01 NOTE — Telephone Encounter (Signed)
Returned call to patient, scheduled patient to see Dr. Harl Bowie on Monday April 17th at 8:20. Patient aware of appointment time and date and verbalized understanding.  ?

## 2021-06-01 NOTE — Telephone Encounter (Signed)
Spoke with Geradine Girt, NP. Denny Peon saw the pt yesterday in clinic and there was some confusion on whether pt should still be on entresto. Per Dr. Wyline Mood ok for pt to stop entresto. Denny Peon states that she will contact the pt and give her this information. Per Dr. Wyline Mood pt needs to be seen at next available appointment. Will make primary RN and send to scheduling.  ?

## 2021-06-05 ENCOUNTER — Ambulatory Visit (INDEPENDENT_AMBULATORY_CARE_PROVIDER_SITE_OTHER): Payer: Medicare Other | Admitting: Internal Medicine

## 2021-06-05 ENCOUNTER — Encounter: Payer: Self-pay | Admitting: Internal Medicine

## 2021-06-05 VITALS — BP 120/80 | HR 82 | Ht 63.5 in | Wt 154.2 lb

## 2021-06-05 DIAGNOSIS — I248 Other forms of acute ischemic heart disease: Secondary | ICD-10-CM

## 2021-06-05 NOTE — Patient Instructions (Signed)
Medication Instructions:  ?No Changes In Medications at this time.  ?*If you need a refill on your cardiac medications before your next appointment, please call your pharmacy* ? ?Lab Work: ?None Ordered At This Time.  ?If you have labs (blood work) drawn today and your tests are completely normal, you will receive your results only by: ?MyChart Message (if you have MyChart) OR ?A paper copy in the mail ?If you have any lab test that is abnormal or we need to change your treatment, we will call you to review the results. ? ?Testing/Procedures: ?None Ordered At This Time.  ? ?Follow-Up: ?At [facility], you and your health needs are our priority.  As part of our continuing mission to provide you with exceptional heart care, we have created designated Provider Care Teams.  These Care Teams include your primary Cardiologist (physician) and Advanced Practice Providers (APPs -  Physician Assistants and Nurse Practitioners) who all work together to provide you with the care you need, when you need it. ? ?We recommend signing up for the patient portal called "MyChart".  Sign up information is provided on this After Visit Summary.  MyChart is used to connect with patients for Virtual Visits (Telemedicine).  Patients are able to view lab/test results, encounter notes, upcoming appointments, etc.  Non-urgent messages can be sent to your provider as well.   ?To learn more about what you can do with MyChart, go to [email or web address].   ? ?Your next appointment:   ?6 month(s) ? ?The format for your next appointment:   ?In Person ? ?Provider:   ?Branch, Mary E, MD   ? ?Important Information About Sugar ? ? ? ? ?  ?

## 2021-06-05 NOTE — Progress Notes (Signed)
?Cardiology Office Note:   ? ?Date:  06/05/2021  ? ?ID:  Heather, [name] [birth year], MRN [id number] ? ?PCP:  Joneen Boers, MD ?  ?[facility] Providers ?Cardiologist:  Fransico Him, MD    ? ?Referring MD: Joneen Boers, MD  ? ?No chief complaint on file. ?Type II demand ischemia ? ?History of Present Illness:   ? ?Heather [name] is a 76 y.o. 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 ? ?She is here for hospital follow-up. Today in clinic she is doing well. She was on DVT prophylaxis with eliquis now completed. Prior to being inpatient she was on lisinopril.She comes in with her daughter. She's been doing well. She denies angina, dyspnea on exertion, lower extremity edema, PND or orthopnea.  ? ?Interim hx 06/05/2021: ?She is having a shoulder replacement on Thursday. Left shoulder.  No shortness of breath. She can go up one flight of stairs. No SOB or chest pressure with this. No recent illnesses. Her repeat echo showed normal LV function, no WMA.  Strain -20. E/e' < 14. ? ?Past Medical History:  ?Diagnosis Date  ? Anxiety   ? Closed right ankle fracture   ? Complication of anesthesia   ? Depression   ? Headache   ? Hyperlipidemia   ? Hypertension   ? IBS (irritable bowel syndrome)   ? PONV (postoperative nausea and vomiting)   ? Seasonal allergies   ? ? ?Past Surgical History:  ?Procedure Laterality Date  ? APPENDECTOMY    ? CHOLECYSTECTOMY N/A 11/14/2020  ? Procedure: LAPAROSCOPIC CHOLECYSTECTOMY;  Surgeon: Kinsinger, Arta Bruce, MD;  Location: [place];  Service: General;  Laterality: N/A;  ? IR PERC CHOLECYSTOSTOMY  10/12/2020  ? JOINT REPLACEMENT Bilateral   ? TKA  ? LEFT HEART CATH AND  CORONARY ANGIOGRAPHY N/A 10/11/2020  ? Procedure: LEFT HEART CATH AND CORONARY ANGIOGRAPHY;  Surgeon: Martinique, Peter M, MD;  Location: [place] CV LAB;  Service: Cardiovascular;  Laterality: N/A;  ? LUMBAR LAMINECTOMY/DECOMPRESSION MICRODISCECTOMY N/A 05/15/2020  ? Procedure: LUMBAR LAMINECTOMY/DECOMPRESSION OF Lumbar three;  Surgeon: Erline Levine, MD;  Location: [place];  Service: Neurosurgery;  Laterality: N/A;  ? LUMBAR PERCUTANEOUS PEDICLE SCREW 2 LEVEL N/A 05/15/2020  ? Procedure: LUMBAR PERCUTANEOUS SCREW, Lumbar one - Lumbar two, Lumbar four - Lumbar five;  Surgeon: Erline Levine, MD;  Location: [place];  Service: Neurosurgery;  Laterality: N/A;  ? ORIF ANKLE FRACTURE Right 06/05/2018  ? Procedure: OPEN REDUCTION INTERNAL FIXATION (ORIF) ANKLE FRACTURE;  Surgeon: Renette Butters, MD;  Location: [place];  Service: Orthopedics;  Laterality: Right;  ? ORIF FEMUR FRACTURE Left 10/13/2020  ? Procedure: OPEN REDUCTION INTERNAL FIXATION (ORIF) DISTAL FEMUR FRACTURE;  Surgeon: Shona Needles, MD;  Location: [place];  Service: Orthopedics;  Laterality: Left;  ? ? ?Current Medications: ?No outpatient medications have been marked as taking for the 06/05/21 encounter (Appointment) with Janina Mayo, MD.  ?  ? ?Allergies:   Augmentin [amoxicillin-pot clavulanate]  ? ?Social History  ? ?Socioeconomic History  ? Marital status: Single  ?  Spouse name: Not on file  ? Number of children: Not on file  ? Years of education: Not on file  ? Highest education  level: Not on file  ?Occupational History  ? Not on file  ?Tobacco Use  ? Smoking status: Former  ? Smokeless tobacco: Never  ?Substance and Sexual Activity  ? Alcohol use: Not Currently  ?  Comment: quit 22 yrs ago  ? Drug use: Never  ? Sexual activity: Not on file  ?Other Topics Concern  ? Not on file  ?Social History Narrative  ? ** Merged History Encounter **  ?    ? ?Social Determinants of Health  ? ?Financial Resource Strain: Not on file  ?Food Insecurity:  Not on file  ?Transportation Needs: Not on file  ?Physical Activity: Not on file  ?Stress: Not on file  ?Social Connections: Not on file  ?  ? ?Family History: ?The patient's family history includes Hypertension in an other family member. ? ?ROS:   ?Please see the history of present illness.    ? All other systems reviewed and are negative. ? ?EKGs/Labs/Other Studies Reviewed:   ? ?The following studies were reviewed today: ? ? ?EKG:   ?10/14/2020- NSR, LAD ? ?06/05/2021- NSR, LAD, poor r wave may be 2/2 lead placement ? ?TTE 10/10/2020 ?EF 50-55% /RV function ?No valvular disease ?No ascending aortic aneurysm ? ?LHC 10/11/2020: ?Non obstructive dx ?Prox LAD 30% ?Mid RCA 20% ? ? ?Recent Labs: ?10/09/2020: B Natriuretic Peptide 417.2; TSH 3.324 ?10/18/2020: Magnesium 2.0 ?11/12/2020: ALT 20 ?11/15/2020: BUN 8; Creatinine, Ser 0.63; Hemoglobin 12.6; Platelets 288; Potassium 4.0; Sodium 134  ?Recent Lipid Panel ?No results found for: CHOL, TRIG, HDL, CHOLHDL, VLDL, LDLCALC, LDLDIRECT ? ? ?Risk Assessment/Calculations:   ?  ? ?    ? ?Physical Exam:   ? ?VS:   ? ?Vitals:  ? 06/05/21 0833  ?BP: 120/80  ?Pulse: 82  ?SpO2: 96%  ? ? ? ?Wt Readings from Last 3 Encounters:  ?12/02/20 148 lb (67.1 kg)  ?11/14/20 153 lb (69.4 kg)  ?10/28/20 152 lb 5.4 oz (69.1 kg)  ?  ? ?GEN: Well nourished, well developed in no acute distress ?HEENT: Normal ?NECK: No JVD ?LYMPHATICS: No lymphadenopathy ?CARDIAC: RRR, no murmurs, rubs, gallops ?Vasc: 2+ BL radial pulses ;radial site clean and dry ?RESPIRATORY:  Clear to auscultation without rales, wheezing or rhonchi  ?ABDOMEN: Soft, non-tender, non-distended ?MUSCULOSKELETAL:  No edema; No deformity  ?SKIN: Warm and dry ?NEUROLOGIC:  Alert and oriented x 3 ?PSYCHIATRIC:  Normal affect  ? ?ASSESSMENT:   ? ?#Type II Demand Ischemia: [name] had demand ischemia in August 2022. Her LHC showed mild non obstructive disease. There was concern for takotsubo, with very mild apical hypokinesis noted on echo.  I do not think she had Takotsubo in August. Repeat echo was normal.  ? ?HTN- Well controlled. cont metop XL 50 mg daily, continue spironolactone 25 mg daily ? ?PLAN:   ? ?In order of problems listed above: ? ?She is acceptable cardiac risk for shoulder replacement ?Follow up in 6 months ? ?Medication Adjustments/Labs and Tests Ordered: ?Current medicines are reviewed at length with the patient today.  Concerns regarding medicines are outlined above.  ? ? ?Signed, ?Janina Mayo, MD  ?06/05/2021 7:48 AM    ?[place] ? ?

## 2021-09-26 ENCOUNTER — Other Ambulatory Visit (HOSPITAL_COMMUNITY): Payer: Self-pay

## 2021-11-18 ENCOUNTER — Other Ambulatory Visit: Payer: Self-pay

## 2021-11-18 ENCOUNTER — Encounter (HOSPITAL_BASED_OUTPATIENT_CLINIC_OR_DEPARTMENT_OTHER): Payer: Self-pay | Admitting: *Deleted

## 2021-11-18 DIAGNOSIS — K2289 Other specified disease of esophagus: Secondary | ICD-10-CM | POA: Insufficient documentation

## 2021-11-18 DIAGNOSIS — R634 Abnormal weight loss: Secondary | ICD-10-CM | POA: Insufficient documentation

## 2021-11-18 DIAGNOSIS — Z7982 Long term (current) use of aspirin: Secondary | ICD-10-CM | POA: Diagnosis not present

## 2021-11-18 DIAGNOSIS — Y92009 Unspecified place in unspecified non-institutional (private) residence as the place of occurrence of the external cause: Secondary | ICD-10-CM | POA: Insufficient documentation

## 2021-11-18 DIAGNOSIS — R1310 Dysphagia, unspecified: Secondary | ICD-10-CM | POA: Diagnosis not present

## 2021-11-18 DIAGNOSIS — Z20822 Contact with and (suspected) exposure to covid-19: Secondary | ICD-10-CM | POA: Diagnosis not present

## 2021-11-18 DIAGNOSIS — K2970 Gastritis, unspecified, without bleeding: Secondary | ICD-10-CM | POA: Insufficient documentation

## 2021-11-18 DIAGNOSIS — Z96653 Presence of artificial knee joint, bilateral: Secondary | ICD-10-CM | POA: Insufficient documentation

## 2021-11-18 DIAGNOSIS — Z87891 Personal history of nicotine dependence: Secondary | ICD-10-CM | POA: Diagnosis not present

## 2021-11-18 DIAGNOSIS — R930 Abnormal findings on diagnostic imaging of skull and head, not elsewhere classified: Secondary | ICD-10-CM | POA: Diagnosis not present

## 2021-11-18 DIAGNOSIS — R0789 Other chest pain: Principal | ICD-10-CM | POA: Insufficient documentation

## 2021-11-18 DIAGNOSIS — S2232XA Fracture of one rib, left side, initial encounter for closed fracture: Secondary | ICD-10-CM | POA: Diagnosis not present

## 2021-11-18 DIAGNOSIS — W010XXA Fall on same level from slipping, tripping and stumbling without subsequent striking against object, initial encounter: Secondary | ICD-10-CM | POA: Diagnosis not present

## 2021-11-18 DIAGNOSIS — I110 Hypertensive heart disease with heart failure: Secondary | ICD-10-CM | POA: Diagnosis not present

## 2021-11-18 DIAGNOSIS — Z79899 Other long term (current) drug therapy: Secondary | ICD-10-CM | POA: Insufficient documentation

## 2021-11-18 DIAGNOSIS — K222 Esophageal obstruction: Secondary | ICD-10-CM | POA: Diagnosis not present

## 2021-11-18 DIAGNOSIS — R8290 Unspecified abnormal findings in urine: Secondary | ICD-10-CM | POA: Diagnosis not present

## 2021-11-18 DIAGNOSIS — I5032 Chronic diastolic (congestive) heart failure: Secondary | ICD-10-CM | POA: Insufficient documentation

## 2021-11-18 NOTE — ED Triage Notes (Signed)
Pt fell at home while by herself.  Pt fell on her left side.  No LOC, did not hit her head.  Pain in left lower ribs

## 2021-11-19 ENCOUNTER — Emergency Department (HOSPITAL_BASED_OUTPATIENT_CLINIC_OR_DEPARTMENT_OTHER): Payer: Medicare Other

## 2021-11-19 ENCOUNTER — Encounter (HOSPITAL_COMMUNITY): Payer: Self-pay

## 2021-11-19 ENCOUNTER — Emergency Department (HOSPITAL_BASED_OUTPATIENT_CLINIC_OR_DEPARTMENT_OTHER): Payer: Medicare Other | Admitting: Radiology

## 2021-11-19 ENCOUNTER — Observation Stay (HOSPITAL_BASED_OUTPATIENT_CLINIC_OR_DEPARTMENT_OTHER)
Admission: EM | Admit: 2021-11-19 | Discharge: 2021-11-21 | Disposition: A | Discharge status: Home Health | Payer: Medicare Other | Attending: Family Medicine | Admitting: Family Medicine

## 2021-11-19 DIAGNOSIS — S2232XA Fracture of one rib, left side, initial encounter for closed fracture: Secondary | ICD-10-CM

## 2021-11-19 DIAGNOSIS — R8290 Unspecified abnormal findings in urine: Secondary | ICD-10-CM | POA: Diagnosis not present

## 2021-11-19 DIAGNOSIS — K2970 Gastritis, unspecified, without bleeding: Secondary | ICD-10-CM | POA: Diagnosis not present

## 2021-11-19 DIAGNOSIS — Y92009 Unspecified place in unspecified non-institutional (private) residence as the place of occurrence of the external cause: Secondary | ICD-10-CM

## 2021-11-19 DIAGNOSIS — I110 Hypertensive heart disease with heart failure: Secondary | ICD-10-CM | POA: Diagnosis not present

## 2021-11-19 DIAGNOSIS — R930 Abnormal findings on diagnostic imaging of skull and head, not elsewhere classified: Secondary | ICD-10-CM | POA: Diagnosis not present

## 2021-11-19 DIAGNOSIS — R0789 Other chest pain: Secondary | ICD-10-CM | POA: Diagnosis present

## 2021-11-19 DIAGNOSIS — R634 Abnormal weight loss: Secondary | ICD-10-CM

## 2021-11-19 DIAGNOSIS — Z7982 Long term (current) use of aspirin: Secondary | ICD-10-CM | POA: Diagnosis not present

## 2021-11-19 DIAGNOSIS — W19XXXA Unspecified fall, initial encounter: Secondary | ICD-10-CM | POA: Diagnosis not present

## 2021-11-19 DIAGNOSIS — K222 Esophageal obstruction: Secondary | ICD-10-CM

## 2021-11-19 DIAGNOSIS — I5032 Chronic diastolic (congestive) heart failure: Secondary | ICD-10-CM

## 2021-11-19 DIAGNOSIS — W010XXA Fall on same level from slipping, tripping and stumbling without subsequent striking against object, initial encounter: Secondary | ICD-10-CM | POA: Diagnosis not present

## 2021-11-19 DIAGNOSIS — S2239XA Fracture of one rib, unspecified side, initial encounter for closed fracture: Secondary | ICD-10-CM

## 2021-11-19 DIAGNOSIS — Z96653 Presence of artificial knee joint, bilateral: Secondary | ICD-10-CM | POA: Diagnosis not present

## 2021-11-19 DIAGNOSIS — Z20822 Contact with and (suspected) exposure to covid-19: Secondary | ICD-10-CM | POA: Diagnosis not present

## 2021-11-19 DIAGNOSIS — R1310 Dysphagia, unspecified: Secondary | ICD-10-CM | POA: Diagnosis not present

## 2021-11-19 DIAGNOSIS — E785 Hyperlipidemia, unspecified: Secondary | ICD-10-CM | POA: Diagnosis not present

## 2021-11-19 DIAGNOSIS — Z87891 Personal history of nicotine dependence: Secondary | ICD-10-CM | POA: Diagnosis not present

## 2021-11-19 DIAGNOSIS — K2289 Other specified disease of esophagus: Secondary | ICD-10-CM | POA: Diagnosis not present

## 2021-11-19 DIAGNOSIS — I10 Essential (primary) hypertension: Secondary | ICD-10-CM | POA: Diagnosis present

## 2021-11-19 DIAGNOSIS — Z79899 Other long term (current) drug therapy: Secondary | ICD-10-CM | POA: Diagnosis not present

## 2021-11-19 LAB — COMPREHENSIVE METABOLIC PANEL
ALT: 7 U/L (ref 0–44)
AST: 15 U/L (ref 15–41)
Albumin: 3.7 g/dL (ref 3.5–5.0)
Alkaline Phosphatase: 60 U/L (ref 38–126)
Anion gap: 10 (ref 5–15)
BUN: 11 mg/dL (ref 8–23)
CO2: 27 mmol/L (ref 22–32)
Calcium: 8.4 mg/dL — ABNORMAL LOW (ref 8.9–10.3)
Chloride: 105 mmol/L (ref 98–111)
Creatinine, Ser: 0.86 mg/dL (ref 0.44–1.00)
GFR, Estimated: >60 mL/min (ref >60)
Glucose, Bld: 100 mg/dL — ABNORMAL HIGH (ref 70–99)
Potassium: 3.7 mmol/L (ref 3.5–5.1)
Sodium: 142 mmol/L (ref 135–145)
Total Bilirubin: 0.3 mg/dL (ref 0.3–1.2)
Total Protein: 6.2 g/dL — ABNORMAL LOW (ref 6.5–8.1)

## 2021-11-19 LAB — DIFFERENTIAL
Abs Immature Granulocytes: 0.02 10*3/uL (ref 0.00–0.07)
Basophils Absolute: 0 10*3/uL (ref 0.0–0.1)
Basophils Relative: 0 %
Eosinophils Absolute: 0.4 10*3/uL (ref 0.0–0.5)
Eosinophils Relative: 5 %
Immature Granulocytes: 0 %
Lymphocytes Relative: 32 %
Lymphs Abs: 2.7 10*3/uL (ref 0.7–4.0)
Monocytes Absolute: 0.6 10*3/uL (ref 0.1–1.0)
Monocytes Relative: 7 %
Neutro Abs: 4.6 10*3/uL (ref 1.7–7.7)
Neutrophils Relative %: 56 %

## 2021-11-19 LAB — URINALYSIS, ROUTINE W REFLEX MICROSCOPIC
Bilirubin Urine: NEGATIVE
Glucose, UA: NEGATIVE mg/dL
Hgb urine dipstick: NEGATIVE
Ketones, ur: NEGATIVE mg/dL
Nitrite: NEGATIVE
Protein, ur: NEGATIVE mg/dL
Specific Gravity, Urine: 1.035 — ABNORMAL HIGH (ref 1.005–1.030)
pH: 6.5 (ref 5.0–8.0)

## 2021-11-19 LAB — CBC
HCT: 36.4 % (ref 36.0–46.0)
Hemoglobin: 11.8 g/dL — ABNORMAL LOW (ref 12.0–15.0)
MCH: 26.8 pg (ref 26.0–34.0)
MCHC: 32.4 g/dL (ref 30.0–36.0)
MCV: 82.7 fL (ref 80.0–100.0)
Platelets: 207 10*3/uL (ref 150–400)
RBC: 4.4 MIL/uL (ref 3.87–5.11)
RDW: 15.8 % — ABNORMAL HIGH (ref 11.5–15.5)
WBC: 8 10*3/uL (ref 4.0–10.5)
nRBC: 0 % (ref 0.0–0.2)

## 2021-11-19 LAB — RAPID URINE DRUG SCREEN, HOSP PERFORMED
Amphetamines: NOT DETECTED
Barbiturates: NOT DETECTED
Benzodiazepines: NOT DETECTED
Cocaine: NOT DETECTED
Opiates: NOT DETECTED
Tetrahydrocannabinol: NOT DETECTED

## 2021-11-19 LAB — RESP PANEL BY RT-PCR (FLU A&B, COVID) ARPGX2
Influenza A by PCR: NEGATIVE
Influenza B by PCR: NEGATIVE
SARS Coronavirus 2 by RT PCR: NEGATIVE

## 2021-11-19 LAB — PROTIME-INR
INR: 1.1 (ref 0.8–1.2)
Prothrombin Time: 14.3 seconds (ref 11.4–15.2)

## 2021-11-19 LAB — APTT: aPTT: 33 seconds (ref 24–36)

## 2021-11-19 LAB — ETHANOL: Alcohol, Ethyl (B): <10 mg/dL (ref <10)

## 2021-11-19 MED ORDER — OXYCODONE HCL 5 MG PO TABS
5.0000 mg | ORAL_TABLET | Freq: Four times a day (QID) | ORAL | Status: DC | PRN
  Administered 2021-11-20: 5 mg via ORAL
  Filled 2021-11-19: qty 1

## 2021-11-19 MED ORDER — KETOROLAC TROMETHAMINE 60 MG/2ML IM SOLN
15.0000 mg | Freq: Once | INTRAMUSCULAR | Status: AC
  Administered 2021-11-19: 15 mg via INTRAMUSCULAR
  Filled 2021-11-19: qty 2

## 2021-11-19 MED ORDER — DICLOFENAC SODIUM ER 100 MG PO TB24: 100.0000 mg | ORAL_TABLET | Freq: Every day | ORAL | 0 refills | Status: DC

## 2021-11-19 MED ORDER — KETOROLAC TROMETHAMINE 15 MG/ML IJ SOLN
15.0000 mg | Freq: Three times a day (TID) | INTRAMUSCULAR | Status: AC | PRN
  Administered 2021-11-20: 15 mg via INTRAVENOUS
  Filled 2021-11-19: qty 1

## 2021-11-19 MED ORDER — STROKE: EARLY STAGES OF RECOVERY BOOK
Freq: Once | Status: AC
  Filled 2021-11-19: qty 1

## 2021-11-19 MED ORDER — ASPIRIN 81 MG PO TBEC
81.0000 mg | DELAYED_RELEASE_TABLET | Freq: Every day | ORAL | Status: DC
  Administered 2021-11-21: 81 mg via ORAL
  Filled 2021-11-19 (×2): qty 1

## 2021-11-19 MED ORDER — IOHEXOL 350 MG/ML SOLN
100.0000 mL | Freq: Once | INTRAVENOUS | Status: AC | PRN
  Administered 2021-11-19: 75 mL via INTRAVENOUS

## 2021-11-19 MED ORDER — ACETAMINOPHEN 500 MG PO TABS
1000.0000 mg | ORAL_TABLET | Freq: Once | ORAL | Status: DC
  Filled 2021-11-19: qty 2

## 2021-11-19 MED ORDER — ACETAMINOPHEN 650 MG RE SUPP: 650.0000 mg | RECTAL | Status: DC | PRN

## 2021-11-19 MED ORDER — ACETAMINOPHEN 160 MG/5ML PO SOLN: 650.0000 mg | ORAL | Status: DC | PRN

## 2021-11-19 MED ORDER — LIDOCAINE 5 % EX PTCH: 1.0000 | MEDICATED_PATCH | CUTANEOUS | 0 refills | Status: AC

## 2021-11-19 MED ORDER — ACETAMINOPHEN 325 MG PO TABS: 650.0000 mg | ORAL_TABLET | ORAL | Status: DC | PRN

## 2021-11-19 MED ORDER — LIDOCAINE 5 % EX PTCH
2.0000 | MEDICATED_PATCH | CUTANEOUS | Status: DC
  Administered 2021-11-19 – 2021-11-21 (×3): 2 via TRANSDERMAL
  Filled 2021-11-19 (×3): qty 2

## 2021-11-19 MED ORDER — ENOXAPARIN SODIUM 40 MG/0.4ML IJ SOSY: 40.0000 mg | PREFILLED_SYRINGE | INTRAMUSCULAR | Status: DC
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
## 2021-11-19 NOTE — ED Provider Notes (Signed)
[facility] EMERGENCY DEPT Provider Note   CSN: [id number] Arrival date & time: 11/18/21  2338     History  Chief Complaint  Patient presents with   Heather [name] is a 76 y.o. female.  The history is provided by the patient.  Fall This is a new problem. The current episode started 6 to 12 hours ago. The problem occurs constantly. The problem has been resolved. Associated symptoms comments: Left rib pain . Nothing aggravates the symptoms. Nothing relieves the symptoms. She has tried nothing for the symptoms. The treatment provided no relief.  Patient with ambulatory dysfunction who uses a walker outside the home but does not use it inside her home tripped and fell onto left ribs and now has rib pain.      Past Medical History:  Diagnosis Date   Anxiety    Closed right ankle fracture    Complication of anesthesia    Depression    Headache    Hyperlipidemia    Hypertension    IBS (irritable bowel syndrome)    PONV (postoperative nausea and vomiting)    Seasonal allergies     Home Medications Prior to Admission medications   Medication Sig Start Date End Date Taking? Authorizing Provider  albuterol (VENTOLIN HFA) 108 (90 Base) MCG/ACT inhaler Inhale 2 puffs into the lungs every 6 (six) hours as needed for wheezing or shortness of breath. 07/12/19   [provider]  buPROPion (WELLBUTRIN XL) 300 MG 24 hr tablet Take 300 mg by mouth daily.    [provider]  diclofenac (CATAFLAM) 50 MG tablet Take 50 mg by mouth 2 (two) times daily as needed. 05/24/21   [provider]  dicyclomine (BENTYL) 20 MG tablet Take 20 mg by mouth 2 (two) times daily. 02/08/20   [provider]  diphenoxylate-atropine (LOMOTIL) 2.5-0.025 MG tablet Take 2 tablets by mouth 4 (four) times daily as needed for diarrhea or loose stools. 10/28/20   Love, Ivan Anchors, PA-C  fexofenadine (ALLEGRA) 180 MG tablet Take 180 mg by mouth every morning.     [provider]  fluticasone (FLONASE) 50 MCG/ACT nasal spray Place 1 spray into both nostrils daily as needed for allergies or rhinitis.    [provider]  gabapentin (NEURONTIN) 300 MG capsule Take 300-600 mg by mouth See admin instructions. Take 1 capsule (300 mg) by mouth in the morning & take 2 capsules (600 mg) at bedtime    [provider]  HYDROcodone-acetaminophen (NORCO) 5-325 MG tablet Take 1 tablet by mouth every 4 (four) hours as needed for moderate pain. Patient not taking: Reported on 06/05/2021 11/15/20   Kinsinger, Arta Bruce, MD  metoprolol succinate (TOPROL-XL) 50 MG 24 hr tablet TAKE 1 TABLET BY MOUTH ONCE DAILY. TAKE WITH OR IMMEDIATELY FOLLOWING A MEAL 01/31/21   Janina Mayo, MD  Multiple Vitamin (MULTIVITAMIN WITH MINERALS) TABS tablet Take 1 tablet by mouth daily. Patient not taking: Reported on 06/05/2021 10/28/20   Love, Ivan Anchors, PA-C  rosuvastatin (CRESTOR) 10 MG tablet Take 10 mg by mouth daily.    [provider]  spironolactone (ALDACTONE) 25 MG tablet Take one-half tablet (12.5 mg total) by mouth daily. 12/02/20 01/01/21  Janina Mayo, MD  traZODone (DESYREL) 150 MG tablet Take 150 mg by mouth at bedtime. 03/06/21   [provider]      Allergies    Augmentin [amoxicillin-pot clavulanate]    Review of Systems   Review of  Systems  Constitutional:  Negative for fever.  HENT:  Negative for facial swelling.   Eyes:  Negative for redness.  Gastrointestinal:  Negative for vomiting.  Musculoskeletal:  Negative for back pain and neck pain.  All other systems reviewed and are negative.   Physical Exam Updated Vital Signs BP (!) 123/57   Pulse 73   Temp 98.3 F (36.8 C) (Oral)   Resp 18   SpO2 97%  Physical Exam Vitals and nursing note reviewed.  Constitutional:      General: She is not in acute distress.    Appearance: Normal appearance. She is well-developed.  HENT:     Head: Normocephalic and atraumatic.      Nose: Nose normal.     Mouth/Throat:     Mouth: Mucous membranes are moist.     Pharynx: Oropharynx is clear.  Eyes:     Extraocular Movements: Extraocular movements intact.     Pupils: Pupils are equal, round, and reactive to light.  Cardiovascular:     Rate and Rhythm: Normal rate and regular rhythm.     Pulses: Normal pulses.     Heart sounds: Normal heart sounds.  Pulmonary:     Effort: Pulmonary effort is normal. No respiratory distress.     Breath sounds: Normal breath sounds.  Abdominal:     General: Bowel sounds are normal. There is no distension.     Palpations: Abdomen is soft.     Tenderness: There is no abdominal tenderness. There is no guarding or rebound.  Genitourinary:    Vagina: No vaginal discharge.  Musculoskeletal:        General: Normal range of motion.     Cervical back: Normal, normal range of motion and neck supple. No rigidity or tenderness.     Thoracic back: Normal.     Lumbar back: Normal.     Right hip: Normal.     Left hip: Normal.     Comments: No foreshortening no external rotation   Lymphadenopathy:     Cervical: No cervical adenopathy.  Skin:    General: Skin is warm and dry.     Capillary Refill: Capillary refill takes less than 2 seconds.     Findings: No erythema or rash.  Neurological:     General: No focal deficit present.     Mental Status: She is alert.     Deep Tendon Reflexes: Reflexes normal.  Psychiatric:        Mood and Affect: Mood normal.        Behavior: Behavior normal.     ED Results / Procedures / Treatments   Labs (all labs ordered are listed, but only abnormal results are displayed) Labs Reviewed - No data to display  EKG None  Radiology DG Ribs Unilateral W/Chest Left  Result Date: 11/19/2021 CLINICAL DATA:  Fall onto left side EXAM: LEFT RIBS AND CHEST - 3+ VIEW COMPARISON:  Radiograph 10/12/2020 FINDINGS: Bibasilar atelectasis. No pleural effusion or pneumothorax. Mild stable cardiomegaly. Bilateral  reverse shoulder arthroplasties. Thoracolumbar spine fusion hardware. The area of symptomatic concern as indicated by the patient was denoted with a metallic skin BB by the technologist. Minimally displaced fracture of the anterolateral left eleventh rib. IMPRESSION: Minimally displaced fracture of the anterolateral left eleventh rib. Electronically Signed   By: Placido Sou M.D.   On: 11/19/2021 01:58    Procedures Procedures    Medications Ordered in ED Medications  acetaminophen (TYLENOL) tablet 1,000 mg (1,000 mg Oral Patient Refused/Not Given  11/19/21 0611)  lidocaine (LIDODERM) 5 % 2 patch (2 patches Transdermal Patch Applied 11/19/21 0612)  ketorolac (TORADOL) injection 15 mg (15 mg Intramuscular Given 11/19/21 [id number])    ED Course/ Medical Decision Making/ A&P                           Medical Decision Making Patient tripped and fell at home  Amount and/or Complexity of Data Reviewed Independent Historian:     Details: Daughter see above  Radiology: ordered and independent interpretation performed.    Details: Rib fracture   Risk OTC drugs. Prescription drug management.    Final Clinical Impression(s) / ED Diagnoses Final diagnoses:  None   Signed out to Dr. Armandina Gemma pending CT Rx / DC Orders ED Discharge Orders     None         Skyler Dusing, MD 11/19/21 [id number]

## 2021-11-19 NOTE — ED Notes (Signed)
Gave report to 3 w

## 2021-11-19 NOTE — ED Notes (Signed)
RT educated pt on proper use of IS. Pt set to 1250 mLs pt able to achieve 1500 mL with good effort. Pt verbalizes understanding of teaching.
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
## 2021-11-20 ENCOUNTER — Observation Stay (HOSPITAL_COMMUNITY): Payer: Medicare Other

## 2021-11-20 ENCOUNTER — Observation Stay (HOSPITAL_BASED_OUTPATIENT_CLINIC_OR_DEPARTMENT_OTHER): Payer: Medicare Other

## 2021-11-20 DIAGNOSIS — R1310 Dysphagia, unspecified: Secondary | ICD-10-CM | POA: Diagnosis not present

## 2021-11-20 DIAGNOSIS — E7800 Pure hypercholesterolemia, unspecified: Secondary | ICD-10-CM | POA: Diagnosis not present

## 2021-11-20 DIAGNOSIS — W19XXXA Unspecified fall, initial encounter: Secondary | ICD-10-CM | POA: Diagnosis not present

## 2021-11-20 DIAGNOSIS — I10 Essential (primary) hypertension: Secondary | ICD-10-CM

## 2021-11-20 DIAGNOSIS — R930 Abnormal findings on diagnostic imaging of skull and head, not elsewhere classified: Secondary | ICD-10-CM

## 2021-11-20 DIAGNOSIS — R634 Abnormal weight loss: Secondary | ICD-10-CM

## 2021-11-20 DIAGNOSIS — Y92009 Unspecified place in unspecified non-institutional (private) residence as the place of occurrence of the external cause: Secondary | ICD-10-CM | POA: Diagnosis not present

## 2021-11-20 LAB — ECHOCARDIOGRAM COMPLETE
AR max vel: 1.59 cm2
AV Area VTI: 1.57 cm2
AV Area mean vel: 1.47 cm2
AV Mean grad: 5 mmHg
AV Peak grad: 8.2 mmHg
Ao pk vel: 1.43 m/s
Area-P 1/2: 4.29 cm2
Calc EF: 52.3 %
S' Lateral: 2.9 cm
Single Plane A2C EF: 47.4 %
Single Plane A4C EF: 55.2 %

## 2021-11-20 LAB — LIPID PANEL
Cholesterol: 157 mg/dL (ref 0–200)
HDL: 40 mg/dL — ABNORMAL LOW (ref >40)
LDL Cholesterol: 102 mg/dL — ABNORMAL HIGH (ref 0–99)
Total CHOL/HDL Ratio: 3.9 RATIO
Triglycerides: 73 mg/dL (ref <150)
VLDL: 15 mg/dL (ref 0–40)

## 2021-11-20 LAB — HEMOGLOBIN A1C
Hgb A1c MFr Bld: 5.5 % (ref 4.8–5.6)
Mean Plasma Glucose: 111.15 mg/dL

## 2021-11-20 LAB — BRAIN NATRIURETIC PEPTIDE: B Natriuretic Peptide: 45.2 pg/mL (ref 0.0–100.0)

## 2021-11-20 MED ORDER — AMLODIPINE BESYLATE 5 MG PO TABS
5.0000 mg | ORAL_TABLET | Freq: Every day | ORAL | Status: DC
  Administered 2021-11-20 – 2021-11-21 (×2): 5 mg via ORAL
  Filled 2021-11-20 (×2): qty 1

## 2021-11-20 MED ORDER — HYDRALAZINE HCL 20 MG/ML IJ SOLN: 10.0000 mg | Freq: Four times a day (QID) | INTRAMUSCULAR | Status: DC | PRN

## 2021-11-20 MED ORDER — ROSUVASTATIN CALCIUM 5 MG PO TABS
10.0000 mg | ORAL_TABLET | Freq: Every day | ORAL | Status: DC
  Administered 2021-11-20: 10 mg via ORAL
  Filled 2021-11-20: qty 2

## 2021-11-20 MED ORDER — METOPROLOL SUCCINATE ER 50 MG PO TB24
50.0000 mg | ORAL_TABLET | Freq: Every day | ORAL | Status: DC
  Administered 2021-11-20 – 2021-11-21 (×2): 50 mg via ORAL
  Filled 2021-11-20 (×2): qty 1

## 2021-11-20 MED ORDER — ENOXAPARIN SODIUM 40 MG/0.4ML IJ SOSY
40.0000 mg | PREFILLED_SYRINGE | INTRAMUSCULAR | Status: DC
  Administered 2021-11-20: 40 mg via SUBCUTANEOUS
  Filled 2021-11-20: qty 0.4

## 2021-11-20 MED ORDER — ROSUVASTATIN CALCIUM 5 MG PO TABS
10.0000 mg | ORAL_TABLET | Freq: Once | ORAL | Status: AC
  Administered 2021-11-20: 10 mg via ORAL
  Filled 2021-11-20: qty 2

## 2021-11-20 MED ORDER — TRAZODONE HCL 50 MG PO TABS
150.0000 mg | ORAL_TABLET | Freq: Every day | ORAL | Status: DC
  Administered 2021-11-20: 150 mg via ORAL
  Filled 2021-11-20: qty 1

## 2021-11-20 MED ORDER — ROSUVASTATIN CALCIUM 20 MG PO TABS
20.0000 mg | ORAL_TABLET | Freq: Every day | ORAL | Status: DC
  Administered 2021-11-21: 20 mg via ORAL
  Filled 2021-11-20: qty 1
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
## 2021-11-20 NOTE — Plan of Care (Signed)
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
## 2021-11-20 NOTE — TOC Initial Note (Signed)
Transition of Care [facility]) - Initial/Assessment Note    Patient Details  Name: Heather [name] MRN: [id number] Date of Birth: August 25, 1945  [facility]) CM/SW Contact:    Kermit Balo, RN Phone Number: 11/20/2021, 3:32 PM  Clinical Narrative:                 Pt is from home with her daughter. Daughter over sees her home medications and pt denies any issues.  Pt's daughter does needed transportation. Plan is for EGD tomorrow.  Pt with recommendations for [facility] services. TOC will arrange prior to d/c home.   Expected Discharge Plan: Home w Home Health Services Barriers to Discharge: Continued Medical Work up   Patient Goals and CMS Choice   CMS Medicare.gov Compare Post Acute Care list provided to:: Patient Choice offered to / list presented to : Patient  Expected Discharge Plan and Services Expected Discharge Plan: Home w Home Health Services   Discharge Planning Services: CM Consult Post Acute Care Choice: Home Health Living arrangements for the past 2 months: Single Family Home                           HH Arranged: PT, OT          Prior Living Arrangements/Services Living arrangements for the past 2 months: Single Family Home Lives with:: Adult Children Patient language and need for interpreter reviewed:: Yes Do you feel safe going back to the place where you live?: Yes          Current home services: DME (rollator/ BSC/ walker/ 4 prong cane) Criminal Activity/Legal Involvement Pertinent to Current Situation/Hospitalization: No - Comment as needed  Activities of Daily Living Home Assistive Devices/Equipment: Walker (specify type) ADL Screening (condition at time of admission) Patient's cognitive ability adequate to safely complete daily activities?: Yes Is the patient deaf or have difficulty hearing?: No Does the patient have difficulty seeing, even when wearing glasses/contacts?: No Does the patient have difficulty concentrating,  remembering, or making decisions?: No Patient able to express need for assistance with ADLs?: Yes Does the patient have difficulty dressing or bathing?: No Independently performs ADLs?: Yes (appropriate for developmental age) Does the patient have difficulty walking or climbing stairs?: No Weakness of Legs: Left Weakness of Arms/Hands: None  Permission Sought/Granted                  Emotional Assessment Appearance:: Appears stated age Attitude/Demeanor/Rapport: Engaged Affect (typically observed): Accepting Orientation: : Oriented to Self, Oriented to Place, Oriented to  Time, Oriented to Situation   Psych Involvement: No (comment)  Admission diagnosis:  Abnormal CT of the head [R93.0] Closed fracture of one rib of left side, initial encounter [S22.32XA] Patient Active Problem List   Diagnosis Date Noted   Dysphagia 11/20/2021   Unintentional weight loss 11/20/2021   Abnormal CT of the head 11/19/2021   Fall at home, initial encounter 11/19/2021   Rib fracture 11/19/2021   HLD (hyperlipidemia) 11/19/2021   Cellulitis    Acute cholecystitis 11/13/2020   Wound infection 11/13/2020   Chronic diastolic CHF (congestive heart failure) (HCC) 11/13/2020   Wound infection after surgery 11/13/2020   Diarrhea 10/28/2020   Closed bicondylar fracture of distal femur, left, with routine healing, subsequent encounter 10/19/2020   Closed fracture of distal end of femur, unspecified fracture morphology, sequela 10/18/2020   Malnutrition of moderate degree 10/12/2020   Hypotension 10/09/2020   Femur fracture (HCC) 10/09/2020  Prolonged QT interval 10/09/2020   Abnormal ECG 10/09/2020   AKI (acute kidney injury) ([place]) 10/09/2020   Hyperkalemia 10/09/2020   Elevated troponin 10/09/2020   Leucocytosis 10/09/2020   Burst fracture of lumbar vertebra ([place]) 05/14/2020   Chronic cough 12/29/2019   Essential hypertension 12/29/2019   Pneumonia due to COVID-19 virus 12/29/2019   PCP:   Joneen Boers, MD Pharmacy:   [place], [address] [place] [address] Phone: [phone] Fax: [phone]  [facility] [place], [address] [place] [address] Phone: [phone] Fax: [phone]     Social Determinants of Health (SDOH) Interventions    Readmission Risk Interventions     No data to display

## 2021-11-20 NOTE — Progress Notes (Signed)
  Echocardiogram 2D Echocardiogram has been performed.  Lana Fish 11/20/2021, 11:14 AM
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
## 2021-11-20 NOTE — Progress Notes (Signed)
Pt s/p fall. CT neg for CVA. Rx consulted for Lovenox for DVT px. BMI 26.  Lovenox 40mg  SQ qday Rx signs off  Onnie Boer, PharmD, [place], AAHIVP, CPP Infectious Disease Pharmacist 11/20/2021 3:19 PM
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
## 2021-11-20 NOTE — Care Management Obs Status (Signed)
[place] NOTIFICATION   Patient Details  Name: Heather [name] MRN: [id number] Date of Birth: 1945-06-12   Medicare Observation Status Notification Given:  Yes    Pollie Friar, RN 11/20/2021, 3:30 PM

## 2021-11-20 NOTE — Progress Notes (Signed)
Mobility Specialist: Progress Note   11/20/21 1633  Mobility  Activity Ambulated with assistance in hallway  Activity Response Tolerated well  Distance Ambulated (ft) 300 ft  $Mobility charge 1 Mobility  Level of Assistance Contact guard assist, steadying assist  Assistive Device Four wheel walker   Received pt in bed having no complaints and agreeable to mobility. Pt was asymptomatic throughout ambulation and returned to room w/o fault. Left in bed w/ call bell in reach and all needs met.  [facility] Evanny Ellerbe Mobility Specialist Mobility Specialist [address]: [phone]

## 2021-11-21 ENCOUNTER — Other Ambulatory Visit (HOSPITAL_COMMUNITY): Payer: Self-pay

## 2021-11-21 ENCOUNTER — Observation Stay (HOSPITAL_COMMUNITY): Payer: Medicare Other | Admitting: Anesthesiology

## 2021-11-21 ENCOUNTER — Encounter (HOSPITAL_COMMUNITY)
Admission: EM | Disposition: A | Discharge status: Home Health | Payer: Self-pay | Source: Home / Self Care | Attending: Emergency Medicine

## 2021-11-21 ENCOUNTER — Observation Stay (HOSPITAL_BASED_OUTPATIENT_CLINIC_OR_DEPARTMENT_OTHER): Payer: Medicare Other | Admitting: Anesthesiology

## 2021-11-21 ENCOUNTER — Encounter (HOSPITAL_COMMUNITY): Payer: Self-pay | Admitting: Internal Medicine

## 2021-11-21 DIAGNOSIS — K222 Esophageal obstruction: Secondary | ICD-10-CM

## 2021-11-21 DIAGNOSIS — I110 Hypertensive heart disease with heart failure: Secondary | ICD-10-CM | POA: Diagnosis not present

## 2021-11-21 DIAGNOSIS — R1310 Dysphagia, unspecified: Secondary | ICD-10-CM | POA: Diagnosis not present

## 2021-11-21 DIAGNOSIS — W19XXXA Unspecified fall, initial encounter: Secondary | ICD-10-CM | POA: Diagnosis not present

## 2021-11-21 DIAGNOSIS — Y92009 Unspecified place in unspecified non-institutional (private) residence as the place of occurrence of the external cause: Secondary | ICD-10-CM | POA: Diagnosis not present

## 2021-11-21 DIAGNOSIS — K2970 Gastritis, unspecified, without bleeding: Secondary | ICD-10-CM

## 2021-11-21 DIAGNOSIS — K2289 Other specified disease of esophagus: Secondary | ICD-10-CM | POA: Diagnosis not present

## 2021-11-21 DIAGNOSIS — Z87891 Personal history of nicotine dependence: Secondary | ICD-10-CM

## 2021-11-21 DIAGNOSIS — I10 Essential (primary) hypertension: Secondary | ICD-10-CM | POA: Diagnosis not present

## 2021-11-21 DIAGNOSIS — I509 Heart failure, unspecified: Secondary | ICD-10-CM

## 2021-11-21 HISTORY — PX: BIOPSY: SHX5522

## 2021-11-21 HISTORY — PX: ESOPHAGOGASTRODUODENOSCOPY (EGD) WITH PROPOFOL: SHX5813

## 2021-11-21 HISTORY — PX: SAVORY DILATION: SHX5439

## 2021-11-21 LAB — URINE CULTURE

## 2021-11-21 SURGERY — ESOPHAGOGASTRODUODENOSCOPY (EGD) WITH PROPOFOL
Anesthesia: Monitor Anesthesia Care

## 2021-11-21 MED ORDER — AMLODIPINE BESYLATE 5 MG PO TABS: 5.0000 mg | ORAL_TABLET | Freq: Every day | ORAL | 0 refills | Status: DC

## 2021-11-21 MED ORDER — PROPOFOL 10 MG/ML IV BOLUS
INTRAVENOUS | Status: DC | PRN
  Administered 2021-11-21: 20 mg via INTRAVENOUS

## 2021-11-21 MED ORDER — PROPOFOL 500 MG/50ML IV EMUL
INTRAVENOUS | Status: DC | PRN
  Administered 2021-11-21: 125 ug/kg/min via INTRAVENOUS

## 2021-11-21 MED ORDER — PANTOPRAZOLE SODIUM 40 MG PO TBEC
40.0000 mg | DELAYED_RELEASE_TABLET | Freq: Every day | ORAL | Status: DC
  Administered 2021-11-21: 40 mg via ORAL
  Filled 2021-11-21: qty 1

## 2021-11-21 MED ORDER — ROSUVASTATIN CALCIUM 20 MG PO TABS: 20.0000 mg | ORAL_TABLET | Freq: Every day | ORAL | 0 refills | Status: AC

## 2021-11-21 MED ORDER — LACTATED RINGERS IV SOLN: INTRAVENOUS | Status: DC | PRN

## 2021-11-21 MED ORDER — PANTOPRAZOLE SODIUM 40 MG PO TBEC: 40.0000 mg | DELAYED_RELEASE_TABLET | Freq: Every day | ORAL | 0 refills | Status: AC

## 2021-11-21 SURGICAL SUPPLY — 15 items

## 2021-11-21 NOTE — Progress Notes (Signed)
OT Cancellation Note  Patient Details Name: Pricila Bridge MRN: [id number] DOB: 23-Mar-1945   Cancelled Treatment:    Reason Eval/Treat Not Completed: Patient at procedure or test/ unavailable. Will follow as see as able.   Jolaine Artist, OT Acute Rehabilitation Services Office [phone]   Delight Stare 11/21/2021, 8:01 AM

## 2021-11-21 NOTE — Progress Notes (Signed)
Mobility Specialist: Progress Note   11/21/21 1117  Mobility  Activity Refused mobility   Pt refused mobility stating she is supposed to discharge soon.  [facility] Lisset Ketchem Mobility Specialist Mobility Specialist [address]: [phone]

## 2021-11-21 NOTE — Interval H&P Note (Signed)
History and Physical Interval Note:  11/21/2021 8:37 AM  Heather [name]  has presented today for surgery, with the diagnosis of Episodes of choking.  Abnormal barium esophagram.  The various methods of treatment have been discussed with the patient and family. After consideration of risks, benefits and other options for treatment, the patient has consented to  Procedure(s): ESOPHAGOGASTRODUODENOSCOPY (EGD) WITH PROPOFOL (N/A) SAVORY DILATION (N/A) as a surgical intervention.  The patient's history has been reviewed, patient examined, no change in status, stable for surgery.  I have reviewed the patient's chart and labs.  Questions were answered to the patient's satisfaction.     Sharyn Creamer

## 2021-11-21 NOTE — Transfer of Care (Signed)
Immediate Anesthesia Transfer of Care Note  Patient: Heather [name]  Procedure(s) Performed: ESOPHAGOGASTRODUODENOSCOPY (EGD) WITH PROPOFOL SAVORY DILATION BIOPSY  Patient Location: PACU and Endoscopy Unit  Anesthesia Type:MAC  Level of Consciousness: drowsy and patient cooperative  Airway & Oxygen Therapy: Patient Spontanous Breathing  Post-op Assessment: Report given to RN and Post -op Vital signs reviewed and stable  Post vital signs: Reviewed and stable  Last Vitals:  Vitals Value Taken Time  BP 113/64 11/21/21 0928  Temp 36.3 C 11/21/21 0928  Pulse 67 11/21/21 0928  Resp    SpO2 91 % 11/21/21 0928    Last Pain:  Vitals:   11/21/21 0928  TempSrc: Temporal  PainSc: 0-No pain      Patients Stated Pain Goal: 0 (68/03/21 2248)  Complications: No notable events documented.

## 2021-11-21 NOTE — TOC Transition Note (Signed)
Transition of Care [facility]) - CM/SW Discharge Note   Patient Details  Name: Heather [name] MRN: [id number] Date of Birth: 09/21/1945  Transition of Care [facility]) CM/SW Contact:  Pollie Friar, RN Phone Number: 11/21/2021, 10:41 AM   Clinical Narrative:    Pt discharging home with home health services through [facility] home health. Information on then AVS.  Pt has transport home.    Final next level of care: Home w Home Health Services Barriers to Discharge: No Barriers Identified   Patient Goals and CMS Choice   CMS Medicare.gov Compare Post Acute Care list provided to:: Patient Choice offered to / list presented to : Patient  Discharge Placement                       Discharge Plan and Services   Discharge Planning Services: CM Consult Post Acute Care Choice: Home Health                    HH Arranged: PT, OT [facility] Agency: [place] Date [place]: 11/21/21   Representative spoke with at [place]: Claiborne Billings  Social Determinants of Health ([place]) Interventions     Readmission Risk Interventions     No data to display
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
## 2021-11-21 NOTE — Anesthesia Preprocedure Evaluation (Signed)
Anesthesia Evaluation  Patient identified by MRN, date of birth, ID band Patient awake    Reviewed: Allergy & Precautions, H&P , NPO status , Patient's Chart, lab work & pertinent test results  History of Anesthesia Complications (+) PONV and history of anesthetic complications  Airway Mallampati: II   Neck ROM: full    Dental   Pulmonary former smoker,    breath sounds clear to auscultation       Cardiovascular hypertension, +CHF   Rhythm:regular Rate:Normal     Neuro/Psych  Headaches, PSYCHIATRIC DISORDERS Anxiety Depression    GI/Hepatic   Endo/Other    Renal/GU      Musculoskeletal   Abdominal   Peds  Hematology   Anesthesia Other Findings   Reproductive/Obstetrics                             Anesthesia Physical Anesthesia Plan  ASA: 3  Anesthesia Plan: MAC   Post-op Pain Management:    Induction: Intravenous  PONV Risk Score and Plan: 3 and Propofol infusion and Treatment may vary due to age or medical condition  Airway Management Planned: Nasal Cannula  Additional Equipment:   Intra-op Plan:   Post-operative Plan:   Informed Consent: I have reviewed the patients History and Physical, chart, labs and discussed the procedure including the risks, benefits and alternatives for the proposed anesthesia with the patient or authorized representative who has indicated his/her understanding and acceptance.     Dental advisory given  Plan Discussed with: CRNA, Anesthesiologist and Surgeon  Anesthesia Plan Comments:         Anesthesia Quick Evaluation

## 2021-11-21 NOTE — TOC CAGE-AID Note (Signed)
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 Denies drug and alcohol use, no resources indicated.   CAGE-AID Screening:    Have You Ever Felt You Ought to Cut Down on Your Drinking or Drug Use?: No Have People Annoyed You By Critizing Your Drinking Or Drug Use?: No Have You Felt Bad Or Guilty About Your Drinking Or Drug Use?: No Have You Ever Had a Drink or Used Drugs First Thing In The Morning to Steady Your Nerves or to Get Rid of a Hangover?: No CAGE-AID Score: 0  Substance Abuse Education Offered: No

## 2021-11-21 NOTE — Op Note (Addendum)
[facility] Patient Name: Heather [name] Procedure Date : 11/21/2021 MRN: [id number] Attending MD: Georgian Co ,  Date of Birth: 17-Jun-1945 CSN: [id number] Age: 76 Admit Type: Inpatient Procedure:                Upper GI endoscopy Indications:              Dysphagia Providers:                Adline Mango" Remus Blake, RN, Janee Morn, Technician Referring MD:             Hospitalist team Medicines:                Monitored Anesthesia Care Complications:            No immediate complications. Estimated Blood Loss:     Estimated blood loss was minimal. Procedure:                Pre-Anesthesia Assessment:                           - Prior to the procedure, a History and Physical                            was performed, and patient medications and                            allergies were reviewed. The patient's tolerance of                            previous anesthesia was also reviewed. The risks                            and benefits of the procedure and the sedation                            options and risks were discussed with the patient.                            All questions were answered, and informed consent                            was obtained. Prior Anticoagulants: The patient has                            taken no previous anticoagulant or antiplatelet                            agents. ASA Grade Assessment: III - A patient with                            severe systemic disease. After reviewing the risks  and benefits, the patient was deemed in                            satisfactory condition to undergo the procedure.                           After obtaining informed consent, the endoscope was                            passed under direct vision. Throughout the                            procedure, the patient's blood pressure, pulse, and                            oxygen  saturations were monitored continuously. The                            [id number]) Olympus endoscope was introduced                            through the mouth, and advanced to the second part                            of duodenum. The upper GI endoscopy was                            accomplished without difficulty. The patient                            tolerated the procedure well. Scope In: Scope Out: Findings:      A few benign-appearing, intrinsic moderate (circumferential scarring or       stenosis; an endoscope may pass) stenoses were found in the proximal       esophagus. The stenoses were traversed. A guidewire was placed and the       scope was withdrawn. Dilation was performed with a Savary dilator with       mild resistance at 15 mm. The dilation site was examined following       endoscope reinsertion and showed mild mucosal disruption.      White nummular lesions were noted in the entire esophagus. Biopsies were       taken with a cold forceps for histology.      Localized mild inflammation characterized by congestion (edema) and       erythema was found in the gastric antrum. Biopsies were taken with a       cold forceps for histology.      The examined duodenum was normal. Impression:               - Benign-appearing esophageal stenoses. Dilated.                           - White nummular lesions in esophageal mucosa.                            Biopsied.                           -  Gastritis. Biopsied.                           - Normal examined duodenum. Recommendation:           - Return patient to hospital ward for ongoing care.                           - Await pathology results.                           - Start PPI QD.                           - Return to GI clinic at appointment to be                            scheduled.                           - The findings and recommendations were discussed                            with the patient. Procedure  Code(s):        --- Professional ---                           [phone], Esophagogastroduodenoscopy, flexible,                            transoral; with insertion of guide wire followed by                            passage of dilator(s) through esophagus over guide                            wire                           43239, 58, Esophagogastroduodenoscopy, flexible,                            transoral; with biopsy, single or multiple Diagnosis Code(s):        --- Professional ---                           K22.2, Esophageal obstruction                           K22.8, Other specified diseases of esophagus                           K29.70, Gastritis, unspecified, without bleeding                           R13.10, Dysphagia, unspecified CPT copyright 2019 American Medical Association. All rights reserved. The codes documented in this report are preliminary and upon coder review may  be revised to meet  current compliance requirements. Dr Georgian Co "[name]" Lorenso Courier,  11/21/2021 9:36:11 AM Number of Addenda: 0

## 2021-11-22 ENCOUNTER — Telehealth: Payer: Self-pay

## 2021-11-22 NOTE — Anesthesia Postprocedure Evaluation (Signed)
Anesthesia Post Note  Patient: Heather [name]  Procedure(s) Performed: ESOPHAGOGASTRODUODENOSCOPY (EGD) WITH PROPOFOL SAVORY DILATION BIOPSY     Patient location during evaluation: Endoscopy Anesthesia Type: MAC Level of consciousness: awake and alert Pain management: pain level controlled Vital Signs Assessment: post-procedure vital signs reviewed and stable Respiratory status: spontaneous breathing, nonlabored ventilation, respiratory function stable and patient connected to nasal cannula oxygen Cardiovascular status: stable and blood pressure returned to baseline Postop Assessment: no apparent nausea or vomiting Anesthetic complications: no   No notable events documented.  Last Vitals:  Vitals:   11/21/21 0950 11/21/21 1050  BP: 135/69 (!) 164/80  Pulse: (!) 58 61  Resp: 13 16  Temp:  36.7 C  SpO2: 95% 95%    Last Pain:  Vitals:   11/21/21 0950  TempSrc:   PainSc: 0-No pain                 Nayely Dingus S

## 2021-11-22 NOTE — Telephone Encounter (Signed)
12/28/21 at 1030 am for follow up with Dr Judithe Modest mailed and sent to My Chart

## 2021-11-22 NOTE — Telephone Encounter (Signed)
-----   Message from Sharyn Creamer, MD sent at 11/21/2021  9:36 AM EDT ----- Jamas Lav, please arrange follow up with me or APP in 1 month for dysphagia. Thanks.

## 2021-11-23 LAB — SURGICAL PATHOLOGY

## 2021-11-26 ENCOUNTER — Encounter (HOSPITAL_COMMUNITY): Payer: Self-pay | Admitting: Internal Medicine

## 2021-12-28 ENCOUNTER — Ambulatory Visit: Payer: Medicare Other | Admitting: Internal Medicine

## 2022-01-15 ENCOUNTER — Encounter: Payer: Self-pay | Admitting: Internal Medicine

## 2022-01-15 ENCOUNTER — Ambulatory Visit: Payer: Medicare Other | Attending: Internal Medicine | Admitting: Internal Medicine

## 2022-01-15 VITALS — BP 100/62 | HR 78 | Ht 63.0 in | Wt 153.6 lb

## 2022-01-15 DIAGNOSIS — I5181 Takotsubo syndrome: Secondary | ICD-10-CM | POA: Diagnosis not present

## 2022-01-15 DIAGNOSIS — I2489 Other forms of acute ischemic heart disease: Secondary | ICD-10-CM | POA: Diagnosis not present

## 2022-01-15 MED ORDER — AMLODIPINE BESYLATE 5 MG PO TABS: 5.0000 mg | ORAL_TABLET | Freq: Every day | ORAL | 3 refills | Status: DC
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
## 2022-01-15 NOTE — Addendum Note (Signed)
Addended by: Bea Laura B on: 01/15/2022 03:24 PM   Modules accepted: Orders

## 2022-01-15 NOTE — Patient Instructions (Signed)
Medication Instructions:  No Changes In Medications at this time.  *If you need a refill on your cardiac medications before your next appointment, please call your pharmacy*  Lab Work: None Ordered At This Time.  If you have labs (blood work) drawn today and your tests are completely normal, you will receive your results only by: MyChart Message (if you have MyChart) OR A paper copy in the mail If you have any lab test that is abnormal or we need to change your treatment, we will call you to review the results.  Testing/Procedures: None Ordered At This Time.   Follow-Up: At [facility], you and your health needs are our priority.  As part of our continuing mission to provide you with exceptional heart care, we have created designated Provider Care Teams.  These Care Teams include your primary Cardiologist (physician) and Advanced Practice Providers (APPs -  Physician Assistants and Nurse Practitioners) who all work together to provide you with the care you need, when you need it.  Your next appointment:   1 year(s)  The format for your next appointment:   In Person  Provider:   Branch, Mary E, MD           

## 2022-01-24 ENCOUNTER — Ambulatory Visit: Payer: Medicare Other | Admitting: Internal Medicine

## 2022-06-26 ENCOUNTER — Other Ambulatory Visit: Payer: Self-pay

## 2022-06-26 MED ORDER — AMLODIPINE BESYLATE 5 MG PO TABS: 5.0000 mg | ORAL_TABLET | Freq: Every day | ORAL | 1 refills | Status: DC

## 2022-10-23 ENCOUNTER — Other Ambulatory Visit: Payer: Self-pay | Admitting: Internal Medicine

## 2022-12-30 NOTE — Progress Notes (Deleted)
Office Visit Note  Patient: Heather [name]             Date of Birth: 05/07/1945           MRN: [id number]             PCP: Janece Canterbury, MD Referring: Janece Canterbury, MD Visit Date: 12/31/2022 Occupation: @GUAROCC @  Subjective:  No chief complaint on file.   History of Present Illness: Heather [name] is a 77 y.o. female ***     Activities of Daily Living:  Patient reports morning stiffness for *** {minute/hour:19697}.   Patient {ACTIONS;DENIES/REPORTS:21021675::"Denies"} nocturnal pain.  Difficulty dressing/grooming: {ACTIONS;DENIES/REPORTS:21021675::"Denies"} Difficulty climbing stairs: {ACTIONS;DENIES/REPORTS:21021675::"Denies"} Difficulty getting out of chair: {ACTIONS;DENIES/REPORTS:21021675::"Denies"} Difficulty using hands for taps, buttons, cutlery, and/or writing: {ACTIONS;DENIES/REPORTS:21021675::"Denies"}  No Rheumatology ROS completed.   PMFS History:  Patient Active Problem List   Diagnosis Date Noted   Esophageal stenosis 11/21/2021   Dysphagia 11/20/2021   Unintentional weight loss 11/20/2021   Abnormal CT of the head 11/19/2021   Fall at home, initial encounter 11/19/2021   Rib fracture 11/19/2021   HLD (hyperlipidemia) 11/19/2021   Cellulitis    Acute cholecystitis 11/13/2020   Wound infection 11/13/2020   Chronic diastolic CHF (congestive heart failure) (HCC) 11/13/2020   Wound infection after surgery 11/13/2020   Diarrhea 10/28/2020   Closed bicondylar fracture of distal femur, left, with routine healing, subsequent encounter 10/19/2020   Closed fracture of distal end of femur, unspecified fracture morphology, sequela 10/18/2020   Malnutrition of moderate degree 10/12/2020   Hypotension 10/09/2020   Femur fracture (HCC) 10/09/2020   Prolonged QT interval 10/09/2020   Abnormal ECG 10/09/2020   AKI (acute kidney injury) (HCC) 10/09/2020   Hyperkalemia 10/09/2020   Elevated troponin 10/09/2020   Leucocytosis 10/09/2020   Burst  fracture of lumbar vertebra (HCC) 05/14/2020   Chronic cough 12/29/2019   Essential hypertension 12/29/2019   Pneumonia due to COVID-19 virus 12/29/2019    Past Medical History:  Diagnosis Date   Anxiety    Closed right ankle fracture    Complication of anesthesia    Depression    Headache    Hyperlipidemia    Hypertension    IBS (irritable bowel syndrome)    PONV (postoperative nausea and vomiting)    Seasonal allergies     Family History  Problem Relation Age of Onset   Hypertension Other    Past Surgical History:  Procedure Laterality Date   APPENDECTOMY     BIOPSY  11/21/2021   Procedure: BIOPSY;  Surgeon: Imogene Burn, MD;  Location: [facility] ENDOSCOPY;  Service: Gastroenterology;;   CHOLECYSTECTOMY N/A 11/14/2020   Procedure: LAPAROSCOPIC CHOLECYSTECTOMY;  Surgeon: Rodman Pickle, MD;  Location: [facility] OR;  Service: General;  Laterality: N/A;   ESOPHAGOGASTRODUODENOSCOPY (EGD) WITH PROPOFOL N/A 11/21/2021   Procedure: ESOPHAGOGASTRODUODENOSCOPY (EGD) WITH PROPOFOL;  Surgeon: Imogene Burn, MD;  Location: [facility] ENDOSCOPY;  Service: Gastroenterology;  Laterality: N/A;   IR PERC CHOLECYSTOSTOMY  10/12/2020   JOINT REPLACEMENT Bilateral    TKA   LEFT HEART CATH AND CORONARY ANGIOGRAPHY N/A 10/11/2020   Procedure: LEFT HEART CATH AND CORONARY ANGIOGRAPHY;  Surgeon: [place]azil[place], Peter M, MD;  Location: [facility] INVASIVE CV LAB;  Service: Cardiovascular;  Laterality: N/A;   LUMBAR LAMINECTOMY/DECOMPRESSION MICRODISCECTOMY N/A 05/15/2020   Procedure: LUMBAR LAMINECTOMY/DECOMPRESSION OF Lumbar three;  Surgeon: Maeola Harman, MD;  Location: [facility] OR;  Service: Neurosurgery;  Laterality: N/A;   LUMBAR PERCUTANEOUS PEDICLE SCREW 2 LEVEL N/A 05/15/2020   Procedure: LUMBAR  PERCUTANEOUS SCREW, Lumbar one - Lumbar two, Lumbar four - Lumbar five;  Surgeon: Maeola Harman, MD;  Location: [facility] OR;  Service: Neurosurgery;  Laterality: N/A;   ORIF ANKLE FRACTURE Right 06/05/2018   Procedure: OPEN REDUCTION INTERNAL  FIXATION (ORIF) ANKLE FRACTURE;  Surgeon: Sheral Apley, MD;  Location: [facility];  Service: Orthopedics;  Laterality: Right;   ORIF FEMUR FRACTURE Left 10/13/2020   Procedure: OPEN REDUCTION INTERNAL FIXATION (ORIF) DISTAL FEMUR FRACTURE;  Surgeon: Roby Lofts, MD;  Location: [facility] OR;  Service: Orthopedics;  Laterality: Left;   SAVORY DILATION N/A 11/21/2021   Procedure: SAVORY DILATION;  Surgeon: Imogene Burn, MD;  Location: [facility] ENDOSCOPY;  Service: Gastroenterology;  Laterality: N/A;   Social History   Social History Narrative   ** Merged History Encounter **       Immunization History  Administered Date(s) Administered   Influenza-Unspecified 12/14/2019   PFIZER(Purple Top)SARS-COV-2 Vaccination 04/20/2019, 05/13/2019, 02/03/2020     Objective: Vital Signs: There were no vitals taken for this visit.   Physical Exam   Musculoskeletal Exam: ***  CDAI Exam: CDAI Score: -- Patient Global: --; Provider Global: -- Swollen: --; Tender: -- Joint Exam 12/31/2022   No joint exam has been documented for this visit   There is currently no information documented on the homunculus. Go to the Rheumatology activity and complete the homunculus joint exam.  Investigation: No additional findings.  Imaging: No results found.  Recent Labs: Lab Results  Component Value Date   WBC 8.0 11/19/2021   HGB 11.8 (L) 11/19/2021   PLT 207 11/19/2021   NA 142 11/19/2021   K 3.7 11/19/2021   CL 105 11/19/2021   CO2 27 11/19/2021   GLUCOSE 100 (H) 11/19/2021   BUN 11 11/19/2021   CREATININE 0.86 11/19/2021   BILITOT 0.3 11/19/2021   ALKPHOS 60 11/19/2021   AST 15 11/19/2021   ALT 7 11/19/2021   PROT 6.2 (L) 11/19/2021   ALBUMIN 3.7 11/19/2021   CALCIUM 8.4 (L) 11/19/2021    Speciality Comments: No specialty comments available.  Procedures:  No procedures performed Allergies: Augmentin [amoxicillin-pot clavulanate]   Assessment / Plan:     Visit Diagnoses:  No diagnosis found.  Orders: No orders of the defined types were placed in this encounter.  No orders of the defined types were placed in this encounter.   Face-to-face time spent with patient was *** minutes. Greater than 50% of time was spent in counseling and coordination of care.  Follow-Up Instructions: No follow-ups on file.   Fuller Plan, MD  Note - This record has been created using [employer].  Chart creation errors have been sought, but may not always  have been located. Such creation errors do not reflect on  the standard of medical care.

## 2022-12-31 ENCOUNTER — Encounter: Payer: Medicare Other | Admitting: Internal Medicine

## 2023-01-04 ENCOUNTER — Other Ambulatory Visit: Payer: Self-pay | Admitting: Internal Medicine

## 2023-01-28 ENCOUNTER — Ambulatory Visit: Payer: Medicare Other | Admitting: Internal Medicine

## 2023-02-03 ENCOUNTER — Other Ambulatory Visit: Payer: Self-pay | Admitting: Internal Medicine

## 2023-02-04 ENCOUNTER — Other Ambulatory Visit: Payer: Self-pay | Admitting: Rheumatology

## 2023-02-04 DIAGNOSIS — J849 Interstitial pulmonary disease, unspecified: Secondary | ICD-10-CM

## 2023-02-04 DIAGNOSIS — M349 Systemic sclerosis, unspecified: Secondary | ICD-10-CM

## 2023-03-14 ENCOUNTER — Other Ambulatory Visit: Payer: 59

## 2023-04-21 IMAGING — DX DG FEMUR 2+V*L*
3 series · 4 of 4 positions shown · non-contrast
Comparison: Preoperative imaging.

CLINICAL DATA: Elective surgery.

EXAM:
LEFT FEMUR 2 VIEWS

[femur ap proximal (1 of 2)]
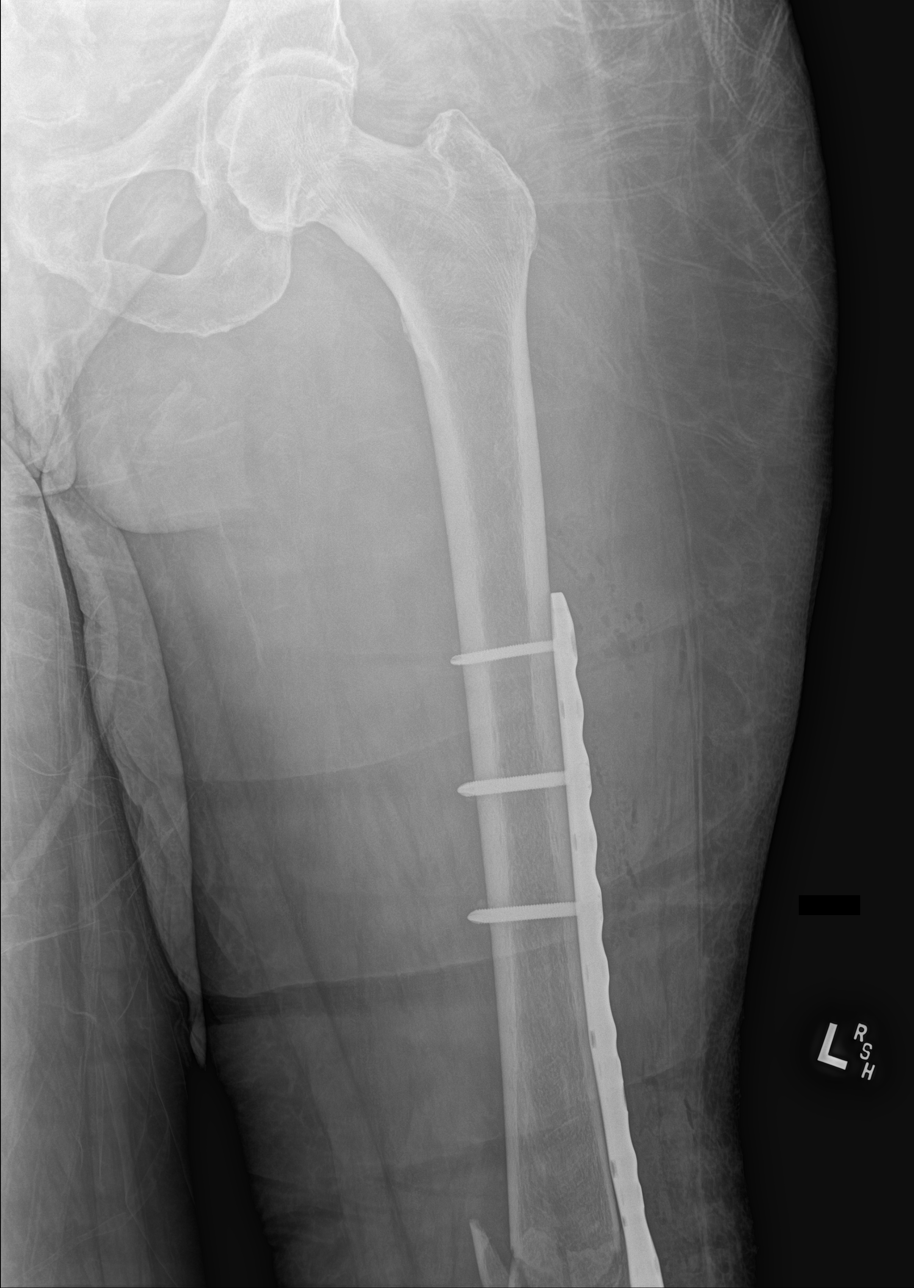

[Series 3: femur lat · 0.14mm/px · 2 of 2 slices shown]
[im 1/2]
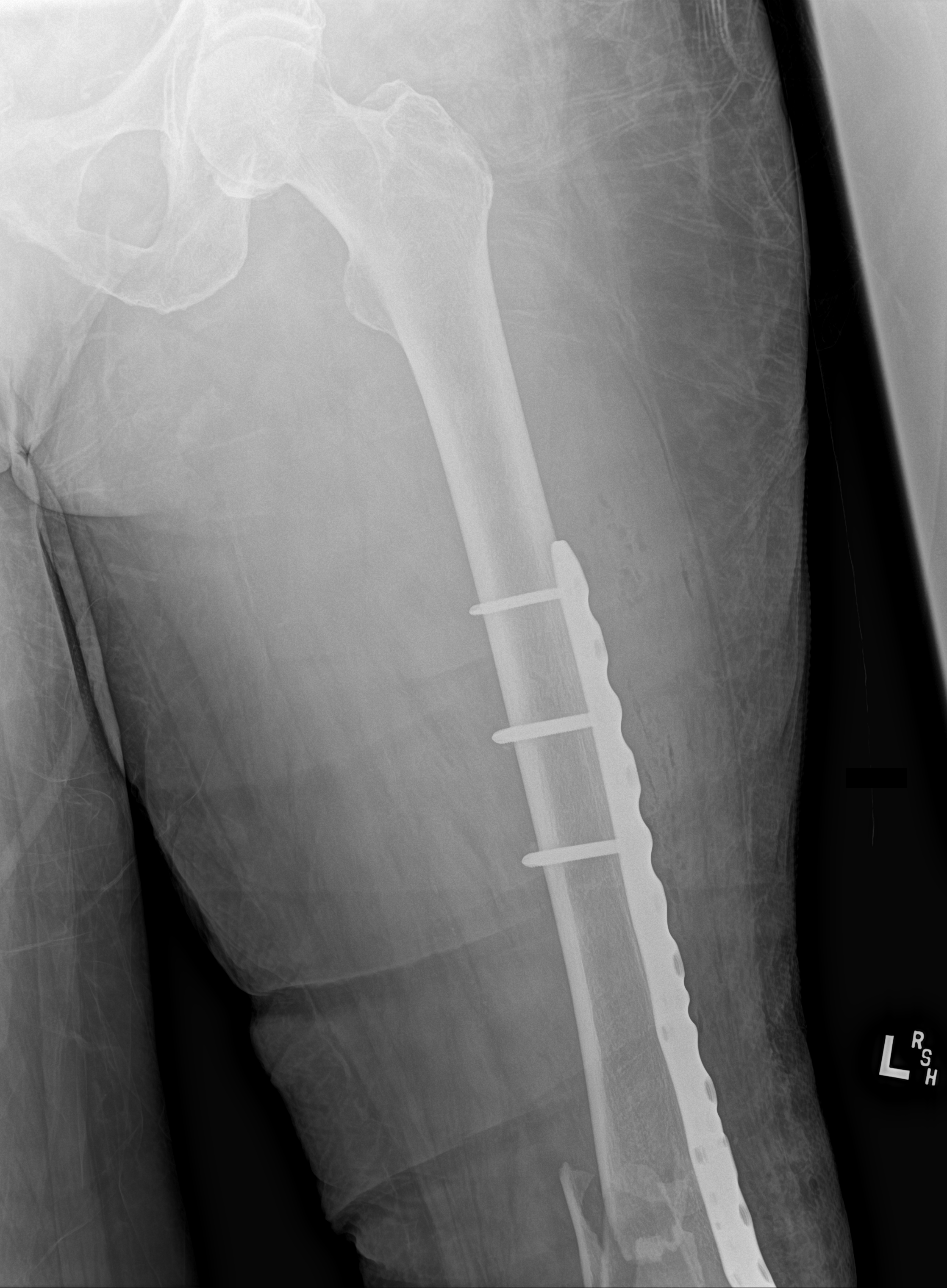
[im 2/2]
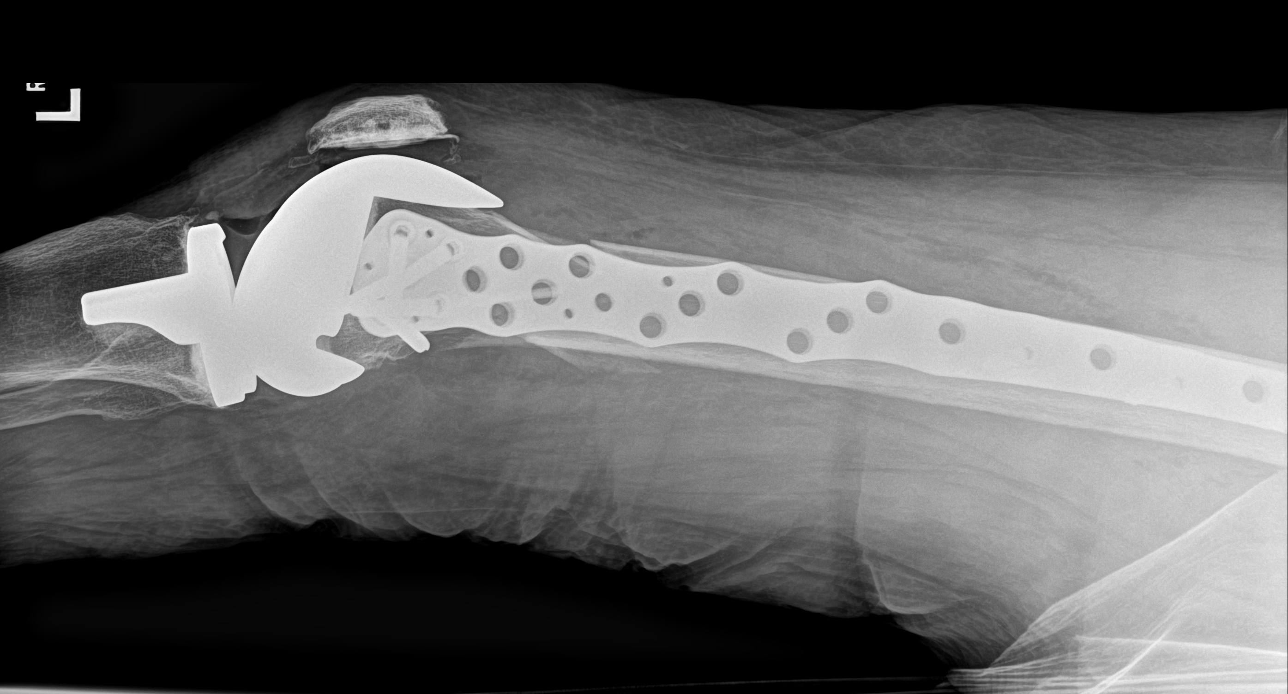

[femur ap proximal (2 of 2)]
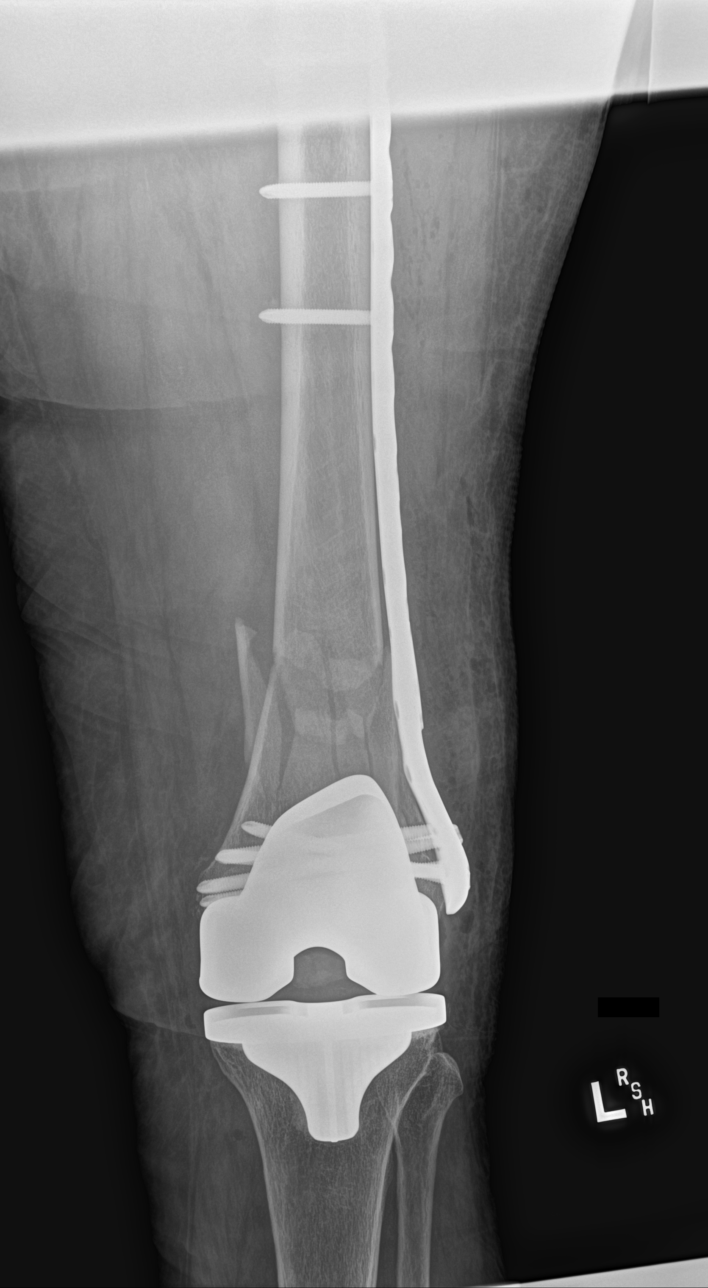

[4 of 4 positions shown; findings below may reference images not displayed]

FINDINGS: Six fluoroscopic spot views of the left femur obtained in the
operating room. Lateral plate and multi screw fixation of distal
femur fracture. Left knee arthroplasty remains in place. Total
fluoroscopy time 1 minutes 5 seconds. Dose 5.98 mGy.
IMPRESSION: Intraoperative fluoroscopy for distal femur fracture ORIF.

## 2023-04-21 IMAGING — DX DG FEMUR 2+V PORT*L*
4 series · 4 of 4 positions shown · non-contrast
Comparison: Preoperative imaging.

CLINICAL DATA: Postop left femur fracture repair.

EXAM:
LEFT FEMUR PORTABLE 2 VIEWS

[femur ap proximal (1 of 2)]
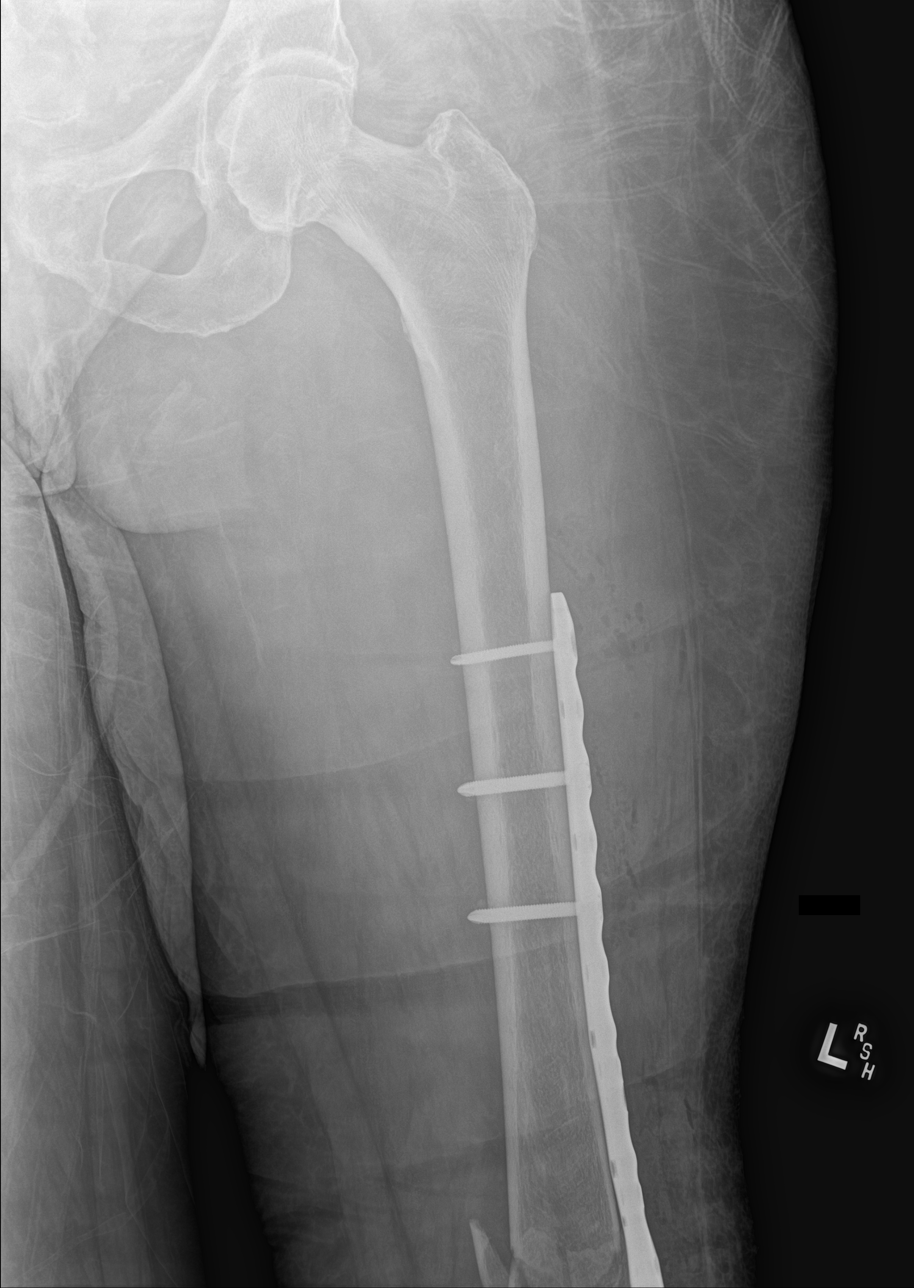

[femur lat (1 of 2)]
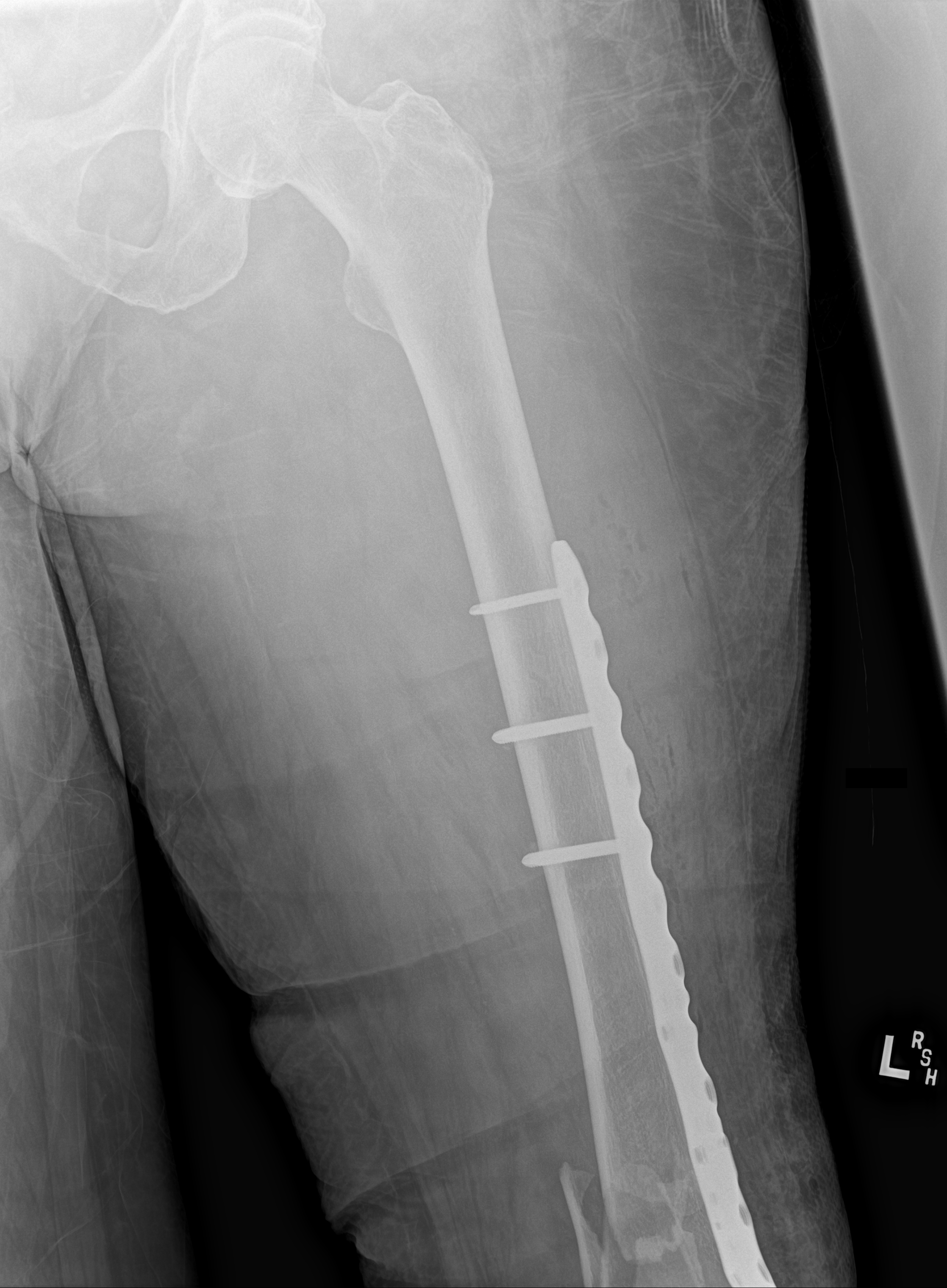

[femur lat (2 of 2)]
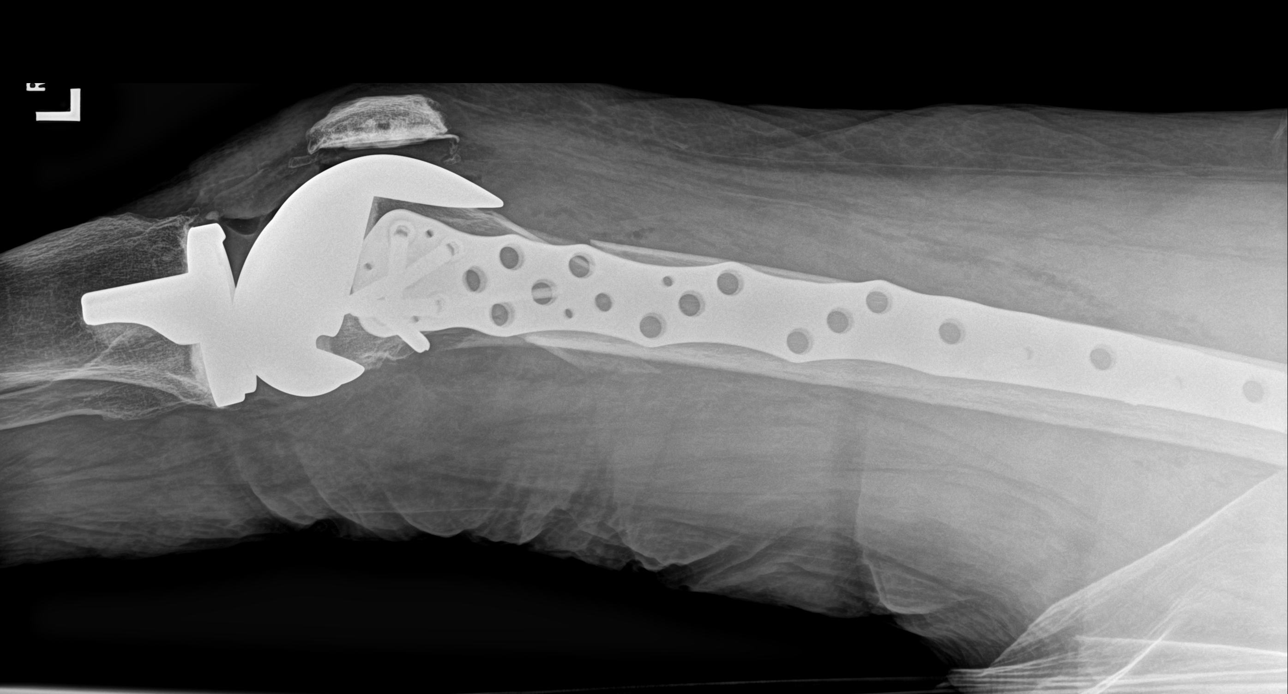

[femur ap proximal (2 of 2)]
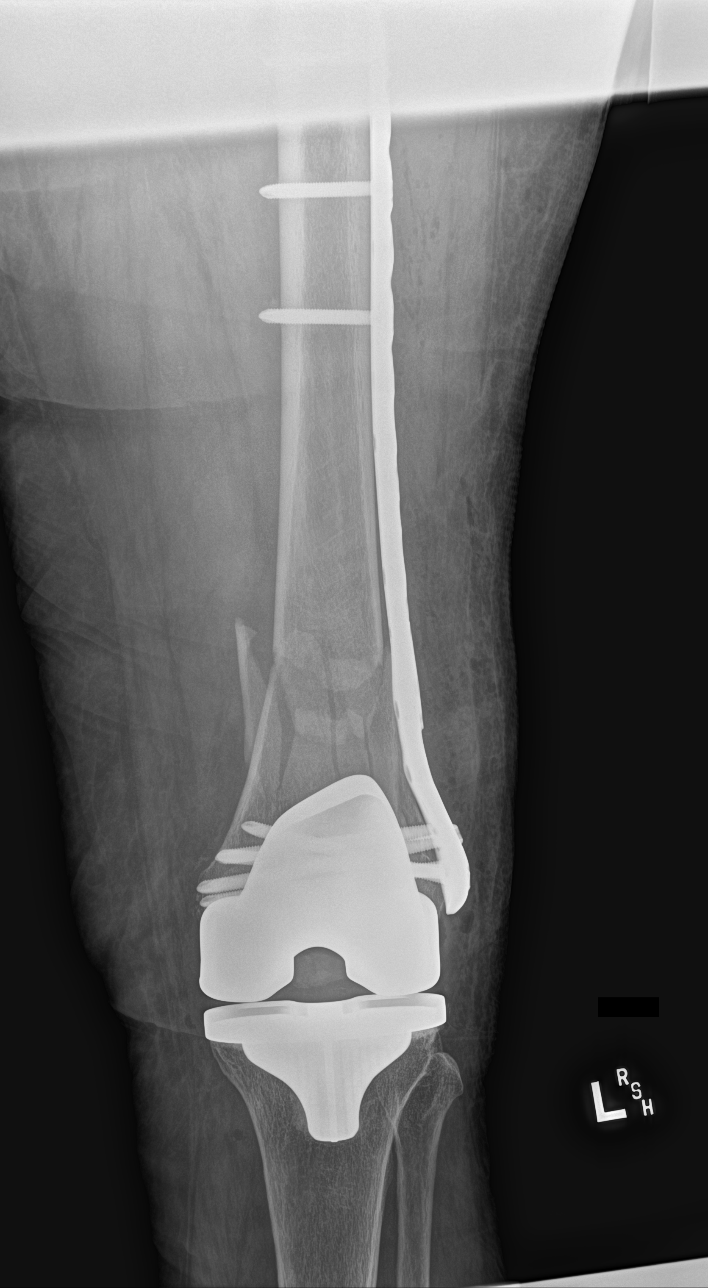

[4 of 4 positions shown; findings below may reference images not displayed]

FINDINGS: Lateral plate and multi screw fixation of comminuted distal femur
fracture. Fracture is in improved alignment from preoperative
imaging. Left knee arthroplasty remains in place. Recent
postsurgical change includes air and edema in the soft tissues.
IMPRESSION: ORIF of comminuted distal femur fracture, in improved alignment from
preoperative imaging.

## 2023-04-26 IMAGING — CR DG ABDOMEN 1V
2 series · 2 of 2 positions shown · non-contrast
Comparison: None.

CLINICAL DATA: Abdomen pain

EXAM:
ABDOMEN - 1 VIEW

[abdomen kub (1 of 2)]
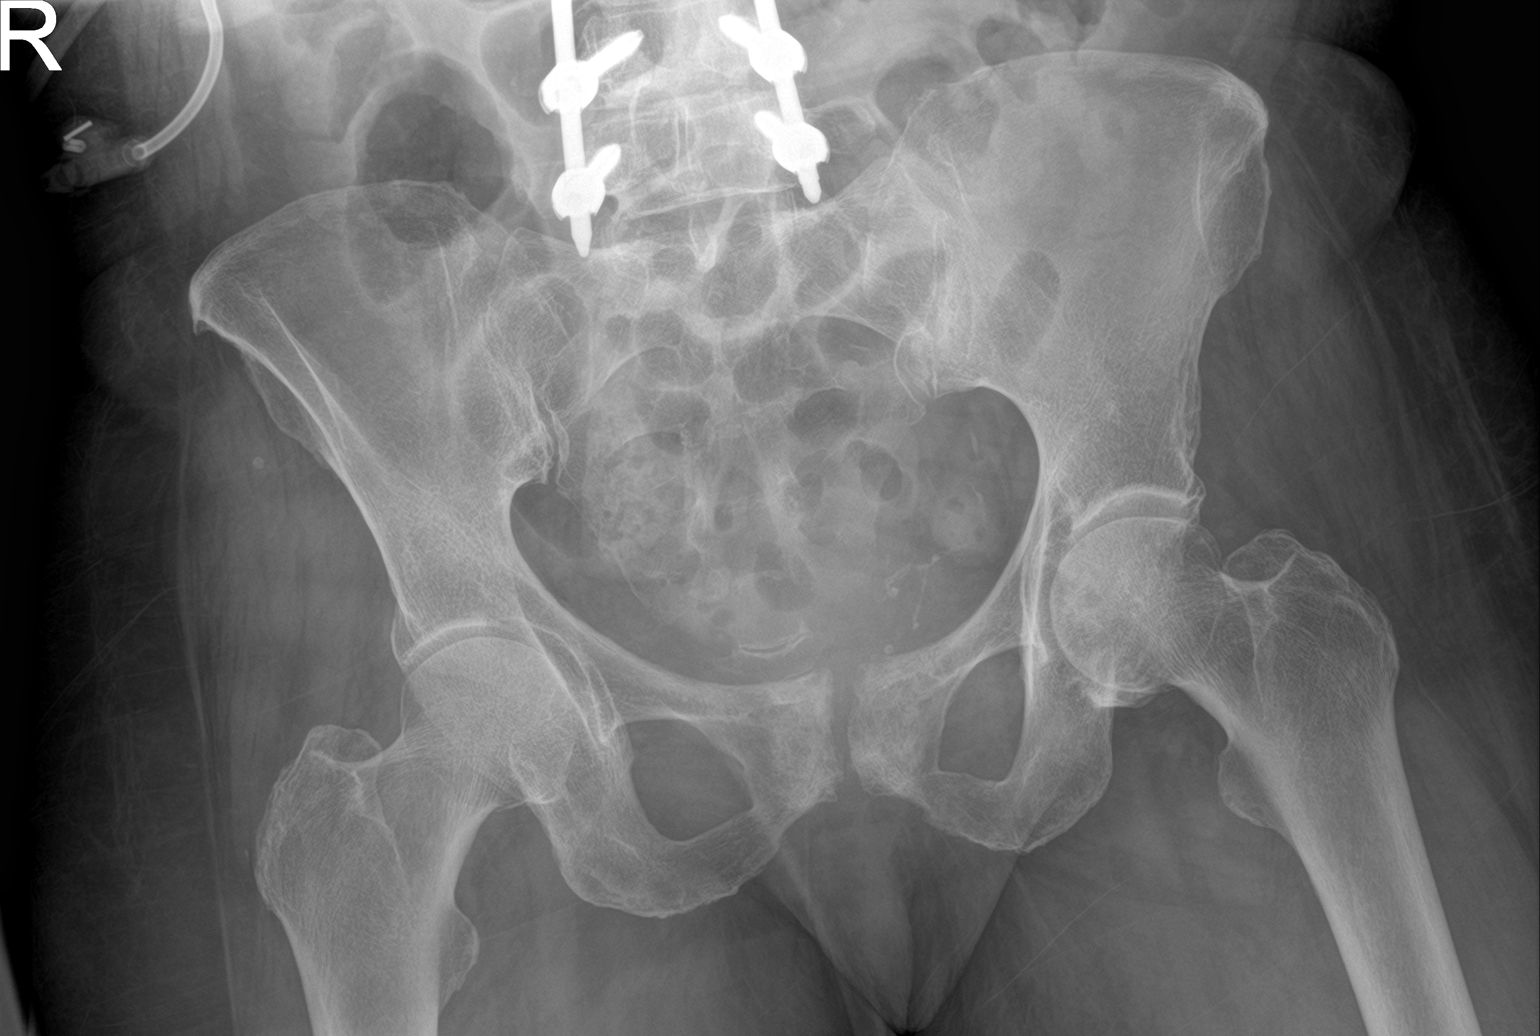

[abdomen kub (2 of 2)]
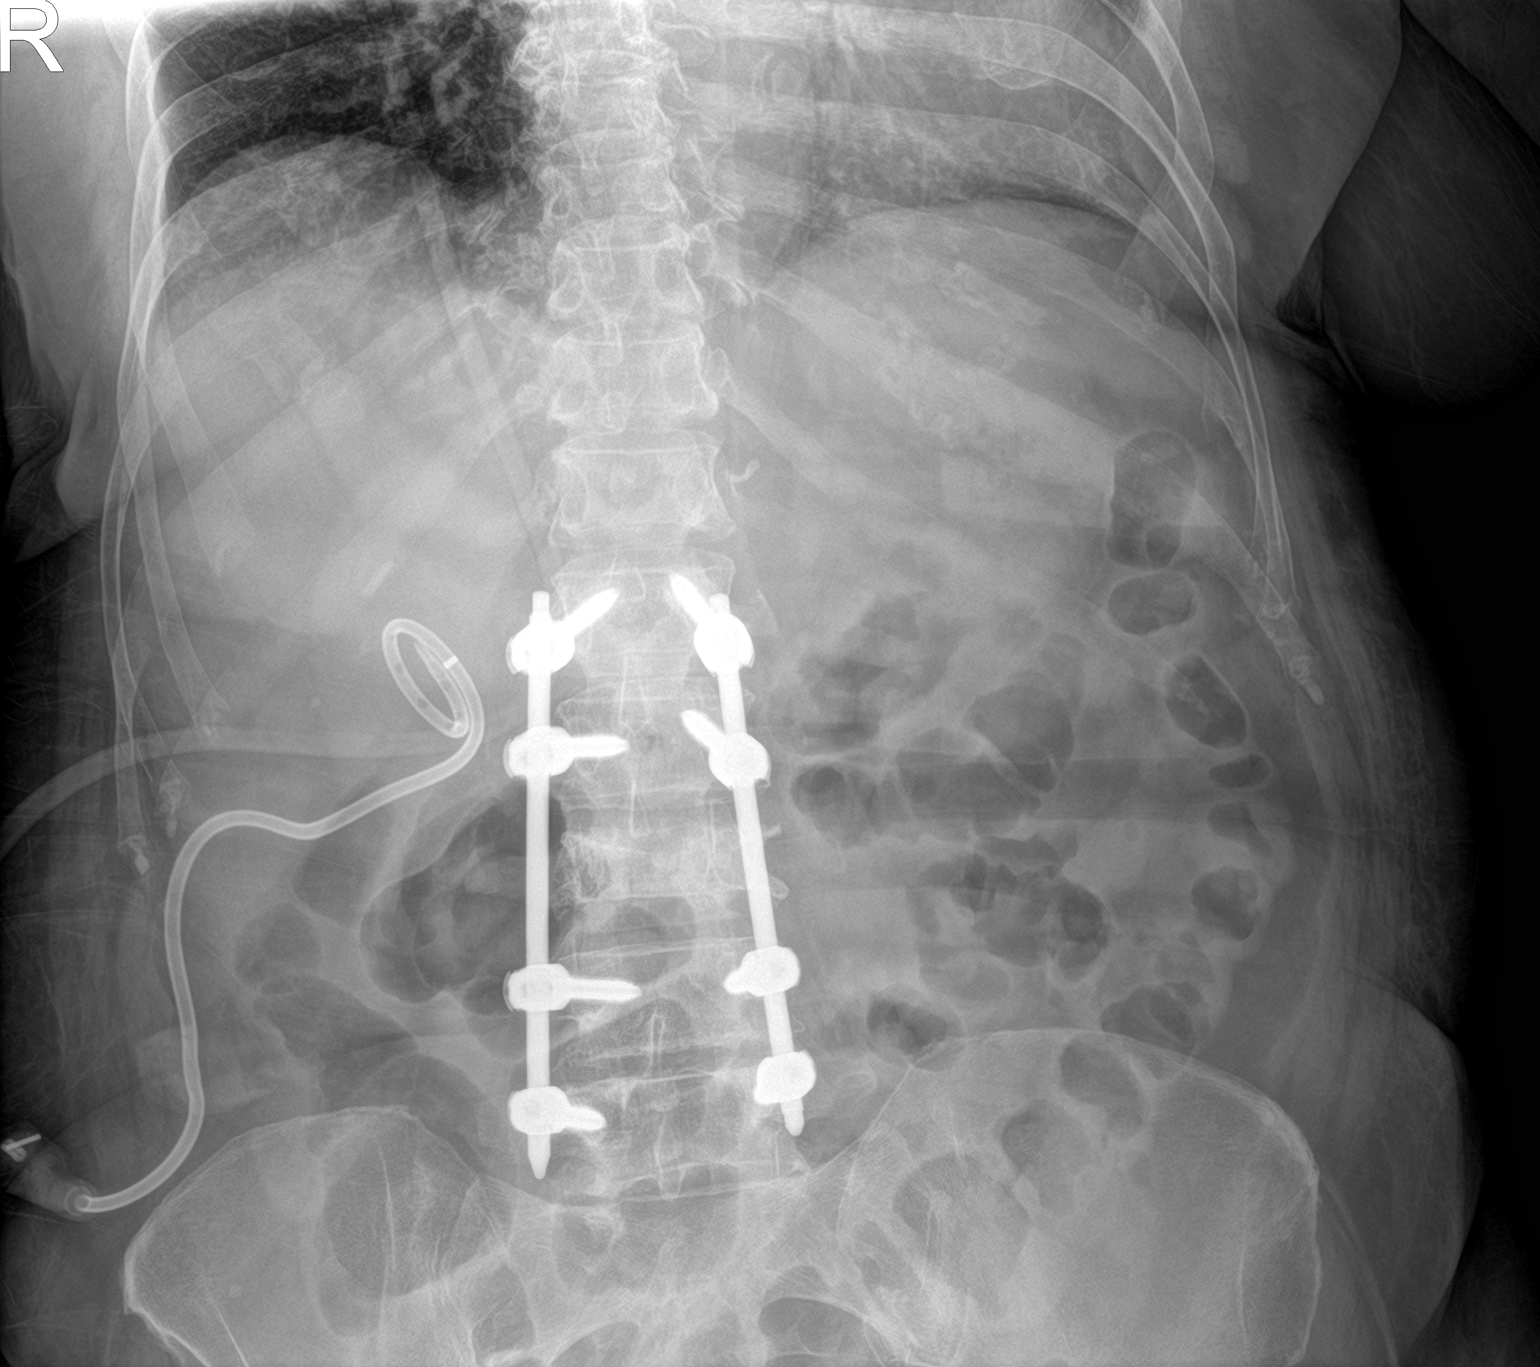

[2 of 2 positions shown; findings below may reference images not displayed]

FINDINGS: Mild increased small and large bowel gas without obstructive
pattern. Cholecystostomy tube in the right upper quadrant. Surgical
hardware in lumbar spine
IMPRESSION: Mild air-filled small and large bowel suggestive mild ileus.

## 2023-08-16 ENCOUNTER — Other Ambulatory Visit (HOSPITAL_COMMUNITY): Payer: Self-pay | Admitting: Radiology

## 2023-08-16 DIAGNOSIS — J849 Interstitial pulmonary disease, unspecified: Secondary | ICD-10-CM

## 2023-08-16 DIAGNOSIS — M349 Systemic sclerosis, unspecified: Secondary | ICD-10-CM

## 2024-01-14 ENCOUNTER — Other Ambulatory Visit: Payer: Self-pay | Admitting: Physician Assistant

## 2024-01-20 MED ORDER — AMLODIPINE BESYLATE 5 MG PO TABS: 5.0000 mg | ORAL_TABLET | Freq: Every day | ORAL | 0 refills | Status: DC

## 2024-01-21 ENCOUNTER — Other Ambulatory Visit: Payer: Self-pay | Admitting: Physician Assistant

## 2024-02-26 ENCOUNTER — Other Ambulatory Visit: Payer: Self-pay | Admitting: Physician Assistant

## 2024-03-09 ENCOUNTER — Other Ambulatory Visit: Payer: Self-pay | Admitting: Physician Assistant
